# Patient Record
Sex: Male | Born: 1945
Health system: Southern US, Community
[De-identification: ages and names within clinical notes are randomized; demographics above are authoritative.]

## PROBLEM LIST (undated history)

## (undated) DIAGNOSIS — F419 Anxiety disorder, unspecified: Secondary | ICD-10-CM

## (undated) DIAGNOSIS — M549 Dorsalgia, unspecified: Secondary | ICD-10-CM

## (undated) DIAGNOSIS — G8929 Other chronic pain: Secondary | ICD-10-CM

## (undated) DIAGNOSIS — Z720 Tobacco use: Secondary | ICD-10-CM

## (undated) DIAGNOSIS — J449 Chronic obstructive pulmonary disease, unspecified: Secondary | ICD-10-CM

## (undated) DIAGNOSIS — E785 Hyperlipidemia, unspecified: Secondary | ICD-10-CM

## (undated) DIAGNOSIS — K4090 Unilateral inguinal hernia, without obstruction or gangrene, not specified as recurrent: Secondary | ICD-10-CM

## (undated) DIAGNOSIS — Z8659 Personal history of other mental and behavioral disorders: Secondary | ICD-10-CM

## (undated) HISTORY — DX: Chronic obstructive pulmonary disease, unspecified: J44.9

## (undated) HISTORY — DX: Personal history of other mental and behavioral disorders: Z86.59

## (undated) HISTORY — DX: Other chronic pain: G89.29

## (undated) HISTORY — PX: LAPAROTOMY: SHX154

## (undated) HISTORY — DX: Tobacco use: Z72.0

## (undated) HISTORY — DX: Hyperlipidemia, unspecified: E78.5

## (undated) HISTORY — DX: Unilateral inguinal hernia, without obstruction or gangrene, not specified as recurrent: K40.90

## (undated) HISTORY — DX: Dorsalgia, unspecified: M54.9

## (undated) HISTORY — DX: Anxiety disorder, unspecified: F41.9

---

## 1998-07-03 HISTORY — PX: EXPLORATORY LAPAROTOMY: SUR591

## 2005-07-03 HISTORY — PX: LUMBAR FUSION: SHX111

## 2006-07-04 ENCOUNTER — Encounter: Admission: RE | Admit: 2006-07-04 | Discharge: 2006-07-04 | Payer: Self-pay | Admitting: Orthopaedic Surgery

## 2006-07-17 ENCOUNTER — Inpatient Hospital Stay (HOSPITAL_COMMUNITY): Admission: RE | Admit: 2006-07-17 | Discharge: 2006-07-20 | Payer: Self-pay | Admitting: Orthopaedic Surgery

## 2006-07-17 HISTORY — PX: LUMBAR FUSION: SHX111

## 2006-09-23 ENCOUNTER — Emergency Department (HOSPITAL_COMMUNITY): Admission: EM | Admit: 2006-09-23 | Discharge: 2006-09-24 | Payer: Self-pay | Admitting: Emergency Medicine

## 2006-12-06 ENCOUNTER — Encounter: Admission: RE | Admit: 2006-12-06 | Discharge: 2006-12-06 | Payer: Self-pay | Admitting: Orthopaedic Surgery

## 2010-11-18 NOTE — Discharge Summary (Signed)
NAMEJHACE, FENNELL NO.:  [id number]   MEDICAL RECORD NO.:  [id number]          PATIENT TYPE:  INP   LOCATION:  5006                         FACILITY:  [facility]   PHYSICIAN:  Sharolyn Douglas, M.D.        DATE OF BIRTH:  04-Nov-1945   DATE OF ADMISSION:  07/17/2006  DATE OF DISCHARGE:  07/20/2006                               DISCHARGE SUMMARY   ADMITTING DIAGNOSES:  1. L4-5 degenerative disease, spinal stenosis.  2. Hypercholesterolemia.   DISCHARGE DIAGNOSES:  1. Status post L4-5 decompression and fusion doing well.  2. Postoperative blood loss anemia.  3. Postoperative hyperglycemia.  4. Hypocholesterolemia.   PROCEDURE:  On July 18, 2006, patient was admitted to the hospital  and taken to the operating room for an L4-5 posterior spinal fusion with  pedicle screws and TLIF.  Surgeon was Sharolyn Douglas, assistant [employer], PA-C.  Anesthesia was general.   CONSULTS:  None.   LABS:  Preoperatively, CBC with diff was within normal limits.  Postoperatively, H&H x3 days reached a low of 11.9 and 34.0 on July 20, 2006.  Coags preop normal.  Complete metabolic panel preoperatively  was normal with the exception of TP of 5.7.  Basic metabolic panel x2  days postoperatively had an elevated glucose of 101 and 127 on postop  day one and two, respectively, otherwise normal.  UA preop was negative.  Blood typing was O-negative.  Antibody screen negative.  Urine cultures  from January 11 showed staph species, 15,000 colonies.   X-rays of the lumbar spine from January 15 intraoperatively for  localization.   X-rays of the lumbar spine on July 20, 2006 showed satisfactory  appearance in L4-5 fusion.   BRIEF HISTORY:  Patient is a 65 year old male who has had increasing  problems with his back extending into his left lower extremity.  He has  had previous laminectomy at L4-5.  His imaging studies shows scoliosis  with collapse at L4-5 disc space on the left side  secondary to  lumbosacral fracture and curve.  His pain had been increasing and failed  to improve with conservative treatments.  Because of this as well as his  x-ray and MRI findings, it was thought his best course of management  would be revision, decompression and fusion at L4-5.  Risks and benefits  of the surgery were discussed with the patient by Dr. Noel Gerold as well as  myself, he indicated understanding and opted to proceed.   HOSPITAL COURSE:  Patient is a 60-year-ol male who was admitted to the  hospital on July 17, 2006 and taken to the operating room for the  above-listed procedure.  He tolerated the procedure well without any  intraoperatively complications and he was transferred to the recovery  room in stable condition.   Postoperatively, routine orthopedic spine protocol was followed and he  progressed along very well with that.  He did not develop any  significant medical complications postoperatively.   Physical therapy and occupational therapy worked with him on a daily  basis on a progressive ambulation program.  They also  worked on brace  use and back precautions.  He was independent and safe with all of the  above prior to his discharge day.   By July 20, 2006, patient had met all orthopedic goals, he was  medically stable and ready for discharge home.   DISCHARGE PLAN:  Patient is a 65 year old male status post posterior  spinal fusion, revision and decompression at L4-5 doing well.  Activity  is daily ambulation program, brace on when he is up, no lifting greater  than five pounds, back precautions at all times, patient may shower,  dressing as needed.  Follow up two weeks postoperatively with Dr. Noel Gerold.   DIET:  Regular diet as tolerated.   MEDICATIONS:  1. Percocet for pain.  2. Robaxin for muscle spasm.  3. Multivitamin daily.  4. Calcium 1200 daily.  5. Colace 100 mg twice daily as needed.  6. Lactulose as needed.  7. Avoid NSAIDsx3 months.    FOLLOWUP:  Follow up two weeks postoperatively with Dr. Noel Gerold.   CONDITION ON DISCHARGE:  Stable and improved.   DISPOSITION:  Patient will be discharged to his home with his family's  assistance as well as home health physical therapy and occupational  therapy.      Verlin Fester, P.A.      Sharolyn Douglas, M.D.  Electronically Signed    CM/MEDQ  D:  10/25/2006  T:  10/25/2006  Job:  [id number]

## 2010-11-18 NOTE — H&P (Signed)
Mark Giles, FULLEN NO.:  [id number]   MEDICAL RECORD NO.:  [id number]           PATIENT TYPE:   LOCATION:                                 FACILITY:   PHYSICIAN:  Sharolyn Douglas, M.D.        DATE OF BIRTH:  12-05-1945   DATE OF ADMISSION:  07/18/2006  DATE OF DISCHARGE:                              HISTORY & PHYSICAL   CHIEF COMPLAINT:  Low back pain.   HISTORY:  The patient is a 65 year old male who has had a long history  of problems with his back.  Unfortunately, he has failed to improve with  any conservative treatments that have been tried.  He has been found to  have L4-5 degenerative disk disease and spinal stenosis.  He has had a  previous L4-5 microdiskectomy without any lasting relief of his  symptoms.  At this point, it is thought secondary to his MRI and x-ray  findings, as well as his failure to improve conservatively, his best  course of management would be a revision decompression and the posterior  spinal fusion at L4-5.  Risks and benefits of the surgery were discussed  at length with the patient by Dr. Noel Gerold, as well as myself.  He  indicated understanding and opted to proceed.   ALLERGIES:  None.   MEDICATIONS:  Xanax, Avinza, Darvocet, Neurontin, Prozac and Mevacor.   PAST MEDICAL HISTORY:  Hypercholesterolemia.   PAST SURGICAL HISTORY:  L4-5 microdiskectomy.   SOCIAL HISTORY:  The patient smokes 1 pack of cigarettes per day.  Drinks sporadically.  He states that he does not have difficulty going  without alcohol for an extended period of time.  He is married.  His  wife will be available to help him postoperatively.  He is a Surveyor, [employer].   FAMILY MEDICAL HISTORY:  Noncontributory.   REVIEW OF SYSTEMS:  The patient denies fevers, chills, sweats or  bleeding tendency.  CNS:  Denies blurred vision, double vision,  seizures, headache, paralysis.  CARDIOVASCULAR:  Denies chest pain,  angina, orthopnea, claudication or palpitations.   PULMONARY:  Denies  shortness of breath, productive cough or hemoptysis.  GI:  Denies  nausea, vomiting, constipation, diarrhea, melena or bloody stool.  GU:  Denies dysuria, hematuria or discharge.  MUSCULOSKELETAL:  As per HPI.   PHYSICAL EXAM:  VITAL SIGNS:  Blood pressure is 118/72.  Respirations  18, unlabored.  Pulse is 76 and regular.  GENERAL APPEARANCE:  The patient is a 65 year old white male who is  alert and oriented, in no acute distress.  He is well nourished, well  groomed.  Appears stated age.  Pleasant, cooperative with exam.  HEENT:  Head is normocephalic, atraumatic.  Pupils equal, round and  reactive to light.  Extraocular movements intact.  Nares are patent.  Pharynx is clear.  NECK:  Soft to palpation.  No lymphadenopathy, thyromegaly or bruits  appreciated.  CHEST:  Clear to auscultation bilaterally.  No rales, rhonchi, stridor,  wheezes or friction rubs.  BREASTS:  Not pertinent, not performed.  HEART:  S1, S2, regular rate and  rhythm.  No murmurs, gallops or rubs  noted.  ABDOMEN:  Soft to palpation.  Nontender, nondistended.  No organomegaly  noted.  GU:  Not pertinent, not performed.  EXTREMITIES:  As per HPI.  SKIN:  Intact without any lesions or rashes.   IMPRESSION:  1. L4-5 degenerative disk disease and spinal stenosis.  2. Hypercholesterolemia.   PLAN:  Admit to [facility] on July 18, 2006, for an L4-5  revision decompression and posterior spinal fusion to be done by Dr.  Noel Gerold.      Verlin Fester, P.A.      Sharolyn Douglas, M.D.  Electronically Signed    CM/MEDQ  D:  07/09/2006  T:  07/10/2006  Job:  [id number]

## 2010-11-18 NOTE — Op Note (Signed)
NAMELORI, LIEW NO.:  [id number]   MEDICAL RECORD NO.:  [id number]          PATIENT TYPE:  INP   LOCATION:  2899                         FACILITY:  [facility]   PHYSICIAN:  Sharolyn Douglas, M.D.        DATE OF BIRTH:  06-28-1946   DATE OF PROCEDURE:  07/17/2006  DATE OF DISCHARGE:                               OPERATIVE REPORT   PREOPERATIVE DIAGNOSES:  1. Recurrent lumbar spinal stenosis.  2. Lumbar scoliosis.  3. Chronic back and left lower extremity pain.   POSTOPERATIVE DIAGNOSES:  1. Recurrent lumbar spinal stenosis.  2. Lumbar scoliosis.  3. Chronic back and left lower extremity pain.   PROCEDURES:  1. Revision, left L4-5 laminectomy, with decompression of the left L4      and L5 nerve roots.  2. Transforaminal lumbar interbody fusion, L4-5, with placement of 11-      mm PEEK cage.  3. Pedicle-screw instrumentation, L4-5, using the [employer] Spine System.  4. Posterior spinal arthrodesis, L4-5.  5. Local autogenous bone graft supplemented with bone morphogenic      protein.   SURGEON:  Sharolyn Douglas, M.D.   ASSISTANT:  Verlin Fester, P.A.   ANESTHESIA:  General endotracheal.   ESTIMATED BLOOD LOSS:  150 mL.   COMPLICATIONS:  None.   COUNTS:  Needle and sponge count correct.   INDICATIONS:  The patient is a pleasant 65 year old male with  progressively worsening back and left lower extremity pain.  He has had  a previous laminectomy at L4-5.  His imaging studies show scoliosis with  collapse of the L4-5 disk space on the left side due to the lumbosacral  fractional curve.  There is a disk-osteophyte complex underneath the  previous laminotomy, with lateral recess and foraminal narrowing.  He  now presents for lumbar decompression and fusion at this level in hopes  of improving his symptoms.  The risks, benefits and alternatives were  reviewed.   DESCRIPTION OF PROCEDURES:  After informed consent, he was taken to the  operating room.  He underwent  general endotracheal anesthesia without  difficulty and was given prophylactic IV antibiotics.  Neural monitoring  was established in the form of SSEPs and lower extremity EMGs.  He was  carefully turned prone onto the Wilson frame with all bony prominences  padded, and his face and eyes protected, the arms at the sides.  The  back was prepped and draped in the usual sterile fashion.  The previous  midline incision was utilized and extended several centimeters in each  direction.  Dissection was carried sharply through the previous scar and  deep tissue.  A subperiosteal exposure was carried out to the tips of  the transverse processes of L4 and L5 bilaterally.  Intraoperative x-ray  was taken to confirm the levels.  The laminotomy was easily identifiable  on the left side and the scar tissue was debrided.  We then placed  pedicle screws at L4 and L5 using anatomic probing technique.  Each  pedicle starting point was initiated with the awl.  The pedicles were  then cannulated using the  probe.  The pedicles were palpated with the  ball-tipped feeler.  There were no breaches.  The pedicles were tapped  and 6.5 x 50-mm screws were placed at L4 bilaterally, and 6.5 x 45-mm  screws were placed at L5 bilaterally.  The bone quality was somewhat  soft, and the screw purchase was average.  We then stimulated each screw  with triggered EMGs and there were no deleterious changes.  Before  placing the screws, we had decorticated the transverse processes at L4  and L5 bilaterally in preparation for the arthrodesis.  We then  initiated a television lumbar decompression by removing the inferior 2/3  of the L4 spinous process.  We then removed the inferior 2/3 of the  remaining left L4 lamina.  The pars interarticularis was transected.  The scar tissue around the lateral border of the thecal sac and the L5  nerve root was carefully dissected.  We found that the L5 nerve root was  adherent to the  underlying disk space.  The nerve root was gently  mobilized and we were able to decompress the disk osteophyte back into  the interspace, and this was removed with pituitaries.  The  decompression was carried proximally, identifying the L4 nerve root as  it exited through the foramen.  At this point, it was elected to proceed  with a transforaminal lumbar interbody fusion in the office further  decompress the foramen indirectly with distraction.  This would also  improve the fusion rate.  The L4 and L5 nerve roots were carefully  identified.  Free-running EMGs were monitored.  The disk space was  entered and dilated up to 11 mm.  A radical diskectomy was completed and  the cartilaginous endplates were scraped clean.  The disk space was  packed with BMP sponges, along with local bone graft obtained from the  laminectomy.  We then inserted an 11-mm PEEK cage into the interspace,  tamped it anteriorly and across the midline.  The cage had been packed  with a BMP sponge.  There were no deleterious changes in the free-  running EMGs throughout the TLIF procedure.  We completed the posterior  spinal fusion by packing the remaining local bone graft over the lateral  gutters between L4 and L5.  And 48-mm titanium rods were placed into the  polyaxial screwheads and compression was applied before shearing off the  locking caps.  The L4 and L5 nerve roots were evaluated using a blunt  probe and they were found to be completely decompressed.  There was  significant scarring noted about the L5 nerve root, which had been  neurolysed.  Hemostasis was achieved.  Gelfoam was left over the excised  epidural space.  A deep drain was left.  The deep fascia was closed with  a running #1 Vicryl suture.  The subcutaneous layer was closed with 2-0  Vicryl, followed by 3-0 subcuticular Vicryl suture on the skin edges,  and then Dermabond on the skin.  A sterile dressing was applied.  The patient was turned  supine, extubated without difficulty, and transferred  to recovery in stable condition, able to move his upper and lower  extremities.   It should be noted that my assistance, Verlin Fester, P.A., was present  throughout the procedure, including the positioning, the exposure, the  decompression, the instrumentation, the fusion, and she assisted with  wound closure.      Sharolyn Douglas, M.D.  Electronically Signed     MC/MEDQ  D:  07/18/2006  T:  07/18/2006  Job:  [id number]

## 2011-07-04 DIAGNOSIS — K4090 Unilateral inguinal hernia, without obstruction or gangrene, not specified as recurrent: Secondary | ICD-10-CM

## 2011-07-04 HISTORY — DX: Unilateral inguinal hernia, without obstruction or gangrene, not specified as recurrent: K40.90

## 2012-01-12 ENCOUNTER — Other Ambulatory Visit (HOSPITAL_COMMUNITY): Payer: Self-pay | Admitting: Internal Medicine

## 2012-01-12 DIAGNOSIS — J449 Chronic obstructive pulmonary disease, unspecified: Secondary | ICD-10-CM

## 2012-03-08 ENCOUNTER — Ambulatory Visit (INDEPENDENT_AMBULATORY_CARE_PROVIDER_SITE_OTHER): Payer: Self-pay | Admitting: General Surgery

## 2012-03-15 ENCOUNTER — Ambulatory Visit (INDEPENDENT_AMBULATORY_CARE_PROVIDER_SITE_OTHER): Payer: BC Managed Care – PPO | Admitting: General Surgery

## 2012-04-05 ENCOUNTER — Ambulatory Visit (INDEPENDENT_AMBULATORY_CARE_PROVIDER_SITE_OTHER): Payer: BC Managed Care – PPO | Admitting: General Surgery

## 2012-04-29 DIAGNOSIS — M545 Low back pain, unspecified: Secondary | ICD-10-CM | POA: Diagnosis not present

## 2012-04-29 DIAGNOSIS — J449 Chronic obstructive pulmonary disease, unspecified: Secondary | ICD-10-CM | POA: Diagnosis not present

## 2012-05-06 ENCOUNTER — Ambulatory Visit (HOSPITAL_COMMUNITY)
Admission: RE | Admit: 2012-05-06 | Discharge: 2012-05-06 | Disposition: A | Discharge status: Home / Self Care | Payer: Medicare Other | Source: Ambulatory Visit | Attending: Internal Medicine | Admitting: Internal Medicine

## 2012-05-06 DIAGNOSIS — J449 Chronic obstructive pulmonary disease, unspecified: Secondary | ICD-10-CM | POA: Insufficient documentation

## 2012-05-06 DIAGNOSIS — J4489 Other specified chronic obstructive pulmonary disease: Secondary | ICD-10-CM | POA: Insufficient documentation

## 2012-05-10 ENCOUNTER — Ambulatory Visit (INDEPENDENT_AMBULATORY_CARE_PROVIDER_SITE_OTHER): Payer: Medicare Other | Admitting: General Surgery

## 2012-05-10 ENCOUNTER — Encounter (INDEPENDENT_AMBULATORY_CARE_PROVIDER_SITE_OTHER): Payer: Self-pay | Admitting: General Surgery

## 2012-05-10 VITALS — BP 118/82 | HR 68 | Temp 98.6°F | Resp 16 | Ht 70.0 in | Wt 160.6 lb

## 2012-05-10 DIAGNOSIS — K4020 Bilateral inguinal hernia, without obstruction or gangrene, not specified as recurrent: Secondary | ICD-10-CM

## 2012-05-10 NOTE — Progress Notes (Signed)
Subjective:   bilateral groin hernias  Patient ID: Mark Giles, male   DOB: 1945/09/03, 66 y.o.   MRN: [id number]  HPI Patient is a 66 year old male referred by Dr. Eula Listen for apparent inguinal hernias. He states that for about 3 years he has noted a progressive bulge initially in the right groin and now in the left groin. He gets some occasional gurgling in the areas and has some discomfort but no severe pain. No nausea or vomiting. Bowel movements are okay. He has not had a colonoscopy. No urinary symptoms. No previous hernia repairs.  Past Medical History  Diagnosis Date  . Anxiety   . COPD (chronic obstructive pulmonary disease)   . Hyperlipidemia   . H/O: depression   . Dyslipidemia   . Tobacco abuse   . Inguinal hernia without mention of obstruction or gangrene, unilateral or unspecified, (not specified as recurrent) 2013  . Chronic back pain     History of    Past Surgical History  Procedure Date  . Lumbar fusion 2007    L4-L5  . Laparotomy     Trauma   Current Outpatient Prescriptions  Medication Sig Dispense Refill  . ALPRAZolam (XANAX) 1 MG tablet       . lovastatin (MEVACOR) 20 MG tablet       . morphine (MS CONTIN) 30 MG 12 hr tablet        Allergies no known allergies  History  Substance Use Topics  . Smoking status: Current Every Day Smoker -- 2.0 packs/day    Types: Cigarettes  . Smokeless tobacco: Not on file  . Alcohol Use: Yes     Review of Systems     Objective:   Physical Exam BP 118/82  Pulse 68  Temp 98.6 F (37 C) (Temporal)  Resp 16  Ht 5\' 10"  (1.778 m)  Wt 160 lb 9.6 oz (72.848 kg)  BMI 23.04 kg/m2 General: Alert, well-developed Caucasian male, in no distress Skin: Warm and dry without rash or infection. HEENT: No palpable masses or thyromegaly. Sclera nonicteric. Pupils equal round and reactive. Oropharynx clear. Lymph nodes: No cervical, supraclavicular, or inguinal nodes palpable. Lungs: Breath sounds clear and equal without  increased work of breathing Cardiovascular: Regular rate and rhythm without murmur. No JVD or edema. Peripheral pulses intact. Abdomen: Nondistended. Soft and nontender. No masses palpable. No organomegaly. There is a well-healed upper midline incision that does not get below the umbilicus. There are easily palpable bilateral inguinal hernias, moderate sized on the right and somewhat smaller on the left that do not extend down into the scrotum. Extremities: No edema or joint swelling or deformity. No chronic venous stasis changes. Neurologic: Alert and fully oriented. Gait normal.     Assessment:     Symptomatic bilateral inguinal hernias. I believe these should be repaired. We discussed options for repair including open and laparoscopic repair. I believe he would be an ideal candidate for laparoscopic repair due to the bilaterality and this is what he would prefer. We discussed the nature of the surgery and expected recovery period with discussed risks of anesthetic complications, bleeding, infection, recurrence, and chronic pain and possible need for open procedure. Was given literature and all his questions were answered.    Plan:     Laparoscopic repair of bilateral inguinal hernias under general anesthesia as an outpatient.  We discussed that if he can cut down or reduce her cigarette smoking this would benefit his healing from surgery and anesthetic risk as well as  other health benefits. He is working with Dr. Rene Paci on this.

## 2012-05-10 NOTE — Patient Instructions (Signed)
Hernia, Surgical Repair °A hernia occurs when an internal organ pushes out through a weak spot in the belly (abdominal) wall muscles. Hernias commonly occur in the groin and around the navel. Hernias often can be pushed back into place (reduced). Most hernias tend to get worse over time. Problems occur when abdominal contents get stuck in the opening (incarcerated hernia). The blood supply gets cut off (strangulated hernia). This is an emergency and needs surgery. Otherwise, hernia repair can be an elective procedure. This means you can schedule this at your convenience when an emergency is not present. Because complications can occur, if you decide to repair the hernia, it is best to do it soon. When it becomes an emergency procedure, there is increased risk of complications after surgery. °CAUSES  °· Heavy lifting. °· Obesity. °· Prolonged coughing. °· Straining to move your bowels. °· Hernias can also occur through a cut (incision) by a surgeonafter an abdominal operation. °HOME CARE INSTRUCTIONS °Before the repair: °· Bed rest is not required. You may continue your normal activities, but avoid heavy lifting (more than 10 pounds) or straining. Cough gently. If you are a smoker, it is best to stop. Even the best hernia repair can break down with the continual strain of coughing. °· Do not wear anything tight over your hernia. Do not try to keep it in with an outside bandage or truss. These can damage abdominal contents if they are trapped in the hernia sac. °· Eat a normal diet. Avoid constipation. Straining over long periods of time to have a bowel movement will increase hernia size. It also can breakdown repairs. If you cannot do this with diet alone, laxatives or stool softeners may be used. °PRIOR TO SURGERY, SEEK IMMEDIATE MEDICAL CARE IF: °You have problems (symptoms) of a trapped (incarcerated) hernia. Symptoms include: °· An oral temperature above 102° F (38.9° C) develops, or as your caregiver  suggests. °· Increasing abdominal pain. °· Feeling sick to your stomach(nausea) and vomiting. °· You stop passing gas or stool. °· The hernia is stuck outside the abdomen, looks discolored, feels hard, or is tender. °· You have any changes in your bowel habits or in the hernia that is unusual for you. °LET YOUR CAREGIVERS KNOW ABOUT THE FOLLOWING: °· Allergies. °· Medications taken including herbs, eye drops, over the counter medications, and creams. °· Use of steroids (by mouth or creams). °· Family or personal history of problems with anesthetics or Novocaine. °· Possibility of pregnancy, if this applies. °· Personal history of blood clots (thrombophlebitis). °· Family or personal history of bleeding or blood problems. °· Previous surgery. °· Other health problems. °BEFORE THE PROCEDURE °You should be present 1 hour prior to your procedure, or as directed by your caregiver.  °AFTER THE PROCEDURE °After surgery, you will be taken to the recovery area. A nurse will watch and check your progress there. Once you are awake, stable, and taking fluids well, you will be allowed to go home as long as there are no problems. Once home, an ice pack (wrapped in a light towel) applied to your operative site may help with discomfort. It may also keep the swelling down. Do not lift anything heavier than 10 pounds (4.55 kilograms). Take showers not baths. Do not drive while taking narcotics. Follow instructions as suggested by your caregiver.  °SEEK IMMEDIATE MEDICAL CARE IF: °After surgery: °· There is redness, swelling, or increasing pain in the wound. °· There is pus coming from the wound. °· There is   drainage from a wound lasting longer than 1 day. °· An unexplained oral temperature above 102° F (38.9° C) develops. °· You notice a foul smell coming from the wound or dressing. °· There is a breaking open of a wound (edged not staying together) after the sutures have been removed. °· You notice increasing pain in the shoulders  (shoulder strap areas). °· You develop dizzy episodes or fainting while standing. °· You develop persistent nausea or vomiting. °· You develop a rash. °· You have difficulty breathing. °· You develop any reaction or side effects to medications given. °MAKE SURE YOU:  °· Understand these instructions. °· Will watch your condition. °· Will get help right away if you are not doing well or get worse. °Document Released: 12/13/2000 Document Revised: 09/11/2011 Document Reviewed: 11/05/2007 °[employer]® Patient Information ©2013 [employer]. ° °

## 2012-07-05 DIAGNOSIS — K4020 Bilateral inguinal hernia, without obstruction or gangrene, not specified as recurrent: Secondary | ICD-10-CM | POA: Diagnosis not present

## 2012-07-06 HISTORY — PX: HERNIA REPAIR: SHX51

## 2012-07-06 HISTORY — PX: LAPAROSCOPIC INGUINAL HERNIA REPAIR: SUR788

## 2012-07-19 DIAGNOSIS — Z125 Encounter for screening for malignant neoplasm of prostate: Secondary | ICD-10-CM | POA: Diagnosis not present

## 2012-07-19 DIAGNOSIS — Z1331 Encounter for screening for depression: Secondary | ICD-10-CM | POA: Diagnosis not present

## 2012-07-19 DIAGNOSIS — F329 Major depressive disorder, single episode, unspecified: Secondary | ICD-10-CM | POA: Diagnosis not present

## 2012-07-19 DIAGNOSIS — M545 Low back pain, unspecified: Secondary | ICD-10-CM | POA: Diagnosis not present

## 2012-07-19 DIAGNOSIS — J449 Chronic obstructive pulmonary disease, unspecified: Secondary | ICD-10-CM | POA: Diagnosis not present

## 2012-07-19 DIAGNOSIS — F17200 Nicotine dependence, unspecified, uncomplicated: Secondary | ICD-10-CM | POA: Diagnosis not present

## 2012-07-19 DIAGNOSIS — E782 Mixed hyperlipidemia: Secondary | ICD-10-CM | POA: Diagnosis not present

## 2012-07-19 DIAGNOSIS — Z0000 Encounter for general adult medical examination without abnormal findings: Secondary | ICD-10-CM | POA: Diagnosis not present

## 2012-07-25 ENCOUNTER — Telehealth (INDEPENDENT_AMBULATORY_CARE_PROVIDER_SITE_OTHER): Payer: Self-pay

## 2012-07-25 NOTE — Telephone Encounter (Signed)
Left message for patient with follow up appointment for 07/26/12 @ 3:15 pm w/Dr. Johna Sheriff

## 2012-07-26 ENCOUNTER — Encounter (INDEPENDENT_AMBULATORY_CARE_PROVIDER_SITE_OTHER): Payer: Medicare Other | Admitting: General Surgery

## 2012-07-26 ENCOUNTER — Telehealth (INDEPENDENT_AMBULATORY_CARE_PROVIDER_SITE_OTHER): Payer: Self-pay

## 2012-07-26 NOTE — Telephone Encounter (Signed)
Pt called stating he does not want to keep appt for today and asked [place] to cx appt. I have cxed appt and advised Christy appt cx.

## 2012-08-07 ENCOUNTER — Encounter (INDEPENDENT_AMBULATORY_CARE_PROVIDER_SITE_OTHER): Payer: Self-pay | Admitting: General Surgery

## 2012-09-09 ENCOUNTER — Other Ambulatory Visit: Payer: Self-pay | Admitting: Gastroenterology

## 2012-10-01 ENCOUNTER — Encounter (HOSPITAL_COMMUNITY): Payer: Self-pay

## 2012-10-03 ENCOUNTER — Encounter (HOSPITAL_COMMUNITY): Payer: Self-pay | Admitting: *Deleted

## 2012-10-22 ENCOUNTER — Encounter (HOSPITAL_COMMUNITY): Payer: Self-pay | Admitting: *Deleted

## 2012-10-22 ENCOUNTER — Encounter (HOSPITAL_COMMUNITY): Payer: Self-pay | Admitting: Anesthesiology

## 2012-10-22 ENCOUNTER — Ambulatory Visit (HOSPITAL_COMMUNITY): Payer: Medicare Other | Admitting: Anesthesiology

## 2012-10-22 ENCOUNTER — Encounter (HOSPITAL_COMMUNITY)
Admission: RE | Disposition: A | Discharge status: Home / Self Care | Payer: Self-pay | Source: Ambulatory Visit | Attending: Gastroenterology

## 2012-10-22 ENCOUNTER — Ambulatory Visit (HOSPITAL_COMMUNITY)
Admission: RE | Admit: 2012-10-22 | Discharge: 2012-10-22 | Disposition: A | Discharge status: Home / Self Care | Payer: Medicare Other | Source: Ambulatory Visit | Attending: Gastroenterology | Admitting: Gastroenterology

## 2012-10-22 DIAGNOSIS — J449 Chronic obstructive pulmonary disease, unspecified: Secondary | ICD-10-CM | POA: Insufficient documentation

## 2012-10-22 DIAGNOSIS — F329 Major depressive disorder, single episode, unspecified: Secondary | ICD-10-CM | POA: Diagnosis not present

## 2012-10-22 DIAGNOSIS — Z1211 Encounter for screening for malignant neoplasm of colon: Secondary | ICD-10-CM | POA: Insufficient documentation

## 2012-10-22 DIAGNOSIS — K5730 Diverticulosis of large intestine without perforation or abscess without bleeding: Secondary | ICD-10-CM | POA: Diagnosis not present

## 2012-10-22 DIAGNOSIS — J4489 Other specified chronic obstructive pulmonary disease: Secondary | ICD-10-CM | POA: Insufficient documentation

## 2012-10-22 DIAGNOSIS — F17200 Nicotine dependence, unspecified, uncomplicated: Secondary | ICD-10-CM | POA: Insufficient documentation

## 2012-10-22 DIAGNOSIS — E785 Hyperlipidemia, unspecified: Secondary | ICD-10-CM | POA: Insufficient documentation

## 2012-10-22 DIAGNOSIS — G8929 Other chronic pain: Secondary | ICD-10-CM | POA: Diagnosis not present

## 2012-10-22 DIAGNOSIS — M549 Dorsalgia, unspecified: Secondary | ICD-10-CM | POA: Insufficient documentation

## 2012-10-22 DIAGNOSIS — F3289 Other specified depressive episodes: Secondary | ICD-10-CM | POA: Insufficient documentation

## 2012-10-22 DIAGNOSIS — F411 Generalized anxiety disorder: Secondary | ICD-10-CM | POA: Diagnosis not present

## 2012-10-22 HISTORY — PX: COLONOSCOPY WITH PROPOFOL: SHX5780

## 2012-10-22 SURGERY — COLONOSCOPY WITH PROPOFOL
Anesthesia: Monitor Anesthesia Care

## 2012-10-22 MED ORDER — LACTATED RINGERS IV SOLN
INTRAVENOUS | Status: DC
  Administered 2012-10-22: 1000 mL via INTRAVENOUS

## 2012-10-22 MED ORDER — ONDANSETRON HCL 4 MG/2ML IJ SOLN
INTRAMUSCULAR | Status: DC | PRN
  Administered 2012-10-22 (×2): 2 mg via INTRAVENOUS

## 2012-10-22 MED ORDER — SODIUM CHLORIDE 0.9 % IV SOLN: INTRAVENOUS | Status: DC

## 2012-10-22 MED ORDER — PROPOFOL 10 MG/ML IV EMUL
INTRAVENOUS | Status: DC | PRN
  Administered 2012-10-22: 100 ug/kg/min via INTRAVENOUS

## 2012-10-22 SURGICAL SUPPLY — 21 items

## 2012-10-22 NOTE — H&P (Signed)
Procedure: Screening colonoscopy  History: The patient is a 67 year old male born 1945/12/29. The patient is scheduled to undergo his first screening colonoscopy with polypectomy to prevent colon cancer.  Chronic medications: Morphine. Lovastatin. Alprazolam.  Past medical and surgical history: Depression. Dyslipidemia. Chronic back pain. Chronic obstructive pulmonary disease. Lumbar disc surgery.  Habits: The patient smokes one pack of cigarettes per day and has chronic obstructive pulmonary disease. He does not consume alcohol.  Medication allergies: None.  Exam: The patient is alert and lying comfortably on the endoscopy stretcher. Lungs are clear the auscultation. Cardiac exam reveals a regular rhythm without audible murmurs. Abdomen is soft, flat, and nontender to palpation.  Plan: Proceed with baseline screening colonoscopy using propofol sedation.

## 2012-10-22 NOTE — Anesthesia Preprocedure Evaluation (Addendum)
Anesthesia Evaluation  Patient identified by MRN, date of birth, ID band Patient awake    Reviewed: Allergy & Precautions, H&P , NPO status , Patient's Chart, lab work & pertinent test results  Airway Mallampati: II TM Distance: >3 FB Neck ROM: full    Dental  (+) Missing and Dental Advisory Given Missing several front teeth lateral:   Pulmonary COPDCurrent Smoker,  Moderate COPD breath sounds clear to auscultation  Pulmonary exam normal       Cardiovascular Exercise Tolerance: Good negative cardio ROS  Rhythm:regular Rate:Normal     Neuro/Psych Anxiety negative neurological ROS  negative psych ROS   GI/Hepatic negative GI ROS, Neg liver ROS,   Endo/Other  negative endocrine ROS  Renal/GU negative Renal ROS  negative genitourinary   Musculoskeletal   Abdominal   Peds  Hematology negative hematology ROS (+)   Anesthesia Other Findings   Reproductive/Obstetrics negative OB ROS                          Anesthesia Physical Anesthesia Plan  ASA: III  Anesthesia Plan: MAC   Post-op Pain Management:    Induction:   Airway Management Planned: Simple Face Mask  Additional Equipment:   Intra-op Plan:   Post-operative Plan:   Informed Consent: I have reviewed the patients History and Physical, chart, labs and discussed the procedure including the risks, benefits and alternatives for the proposed anesthesia with the patient or authorized representative who has indicated his/her understanding and acceptance.   Dental Advisory Given  Plan Discussed with: CRNA and Surgeon  Anesthesia Plan Comments:        Anesthesia Quick Evaluation

## 2012-10-22 NOTE — Discharge Instructions (Signed)
Colonoscopy  Care After  These instructions give you information on caring for yourself after your procedure. Your doctor may also give you more specific instructions. Call your doctor if you have any problems or questions after your procedure.  HOME CARE  · Take it easy for the next 24 hours.  · Rest.  · Walk or use warm packs on your belly (abdomen) if you have belly cramping or gas.  · Do not drive for 24 hours.  · You may shower.  · Do not sign important papers or use machinery for 24 hours.  · Drink enough fluids to keep your pee (urine) clear or pale yellow.  · Resume your normal diet. Avoid heavy or fried foods.  · Avoid alcohol.  · Continue taking your normal medicines.  · Only take medicine as told by your doctor. Do not take aspirin.  If you had growths (polyps) removed:  · Do not take aspirin.  · Do not drink alcohol for 7 days or as told by your doctor.  · Eat a soft diet for 24 hours.  GET HELP RIGHT AWAY IF:  · You have a fever.  · You pass clumps of tissue (blood clots) or fill the toilet with blood.  · You have belly pain that gets worse and medicine does not help.  · Your belly is puffy (swollen).  · You feel sick to your stomach (nauseous) or throw up (vomit).  MAKE SURE YOU:  · Understand these instructions.  · Will watch your condition.  · Will get help right away if you are not doing well or get worse.  Document Released: 07/22/2010 Document Revised: 09/11/2011 Document Reviewed: 07/22/2010  ExitCare® Patient Information ©2013 [employer].

## 2012-10-22 NOTE — Anesthesia Postprocedure Evaluation (Signed)
  Anesthesia Post-op Note  Patient: Mark Giles  Procedure(s) Performed: Procedure(s) (LRB): COLONOSCOPY WITH PROPOFOL (N/A)  Patient Location: PACU  Anesthesia Type: MAC  Level of Consciousness: awake and alert   Airway and Oxygen Therapy: Patient Spontanous Breathing  Post-op Pain: mild  Post-op Assessment: Post-op Vital signs reviewed, Patient's Cardiovascular Status Stable, Respiratory Function Stable, Patent Airway and No signs of Nausea or vomiting  Last Vitals:  Filed Vitals:   10/22/12 1156  BP: 98/60  Temp:   Resp: 18    Post-op Vital Signs: stable   Complications: No apparent anesthesia complications

## 2012-10-22 NOTE — Op Note (Signed)
Procedure: Screening colonoscopy  Endoscopist: Danise Edge  Premedication: Propofol administered by anesthesia  Procedure: The patient was placed in the left lateral decubitus position. Anal inspection and digital rectal exam were normal. The Pentax pediatric colonoscope was introduced into the rectum and advanced to the cecum. A normal-appearing ileocecal valve and appendiceal orifice were identified. Colonic preparation for the exam today was good.  Rectum. Normal. Retroflex view of the distal rectum normal.  Sigmoid colon and descending colon. Left colonic diverticulosis.  Splenic flexure. Normal.  Transverse colon. Normal.  Hepatic flexure. Normal.  Ascending colon. Normal.  Cecum and ileocecal valve. Normal.  Assessment: Normal screening proctocolonoscopy to the cecum.  Recommendations: Schedule repeat screening colonoscopy in 10 years.

## 2012-10-22 NOTE — Transfer of Care (Signed)
Immediate Anesthesia Transfer of Care Note  Patient: Mark Giles  Procedure(s) Performed: Procedure(s): COLONOSCOPY WITH PROPOFOL (N/A)  Patient Location: PACU  Anesthesia Type:MAC  Level of Consciousness: awake, alert , oriented and patient cooperative  Airway & Oxygen Therapy: Patient Spontanous Breathing and Patient connected to face mask oxygen  Post-op Assessment: Report given to PACU RN and Post -op Vital signs reviewed and stable  Post vital signs: Reviewed and stable  Complications: No apparent anesthesia complications

## 2012-10-23 ENCOUNTER — Encounter (HOSPITAL_COMMUNITY): Payer: Self-pay | Admitting: Gastroenterology

## 2013-01-17 DIAGNOSIS — R5381 Other malaise: Secondary | ICD-10-CM | POA: Diagnosis not present

## 2013-01-17 DIAGNOSIS — R5383 Other fatigue: Secondary | ICD-10-CM | POA: Diagnosis not present

## 2013-01-17 DIAGNOSIS — F17200 Nicotine dependence, unspecified, uncomplicated: Secondary | ICD-10-CM | POA: Diagnosis not present

## 2013-01-17 DIAGNOSIS — J449 Chronic obstructive pulmonary disease, unspecified: Secondary | ICD-10-CM | POA: Diagnosis not present

## 2013-01-17 DIAGNOSIS — E782 Mixed hyperlipidemia: Secondary | ICD-10-CM | POA: Diagnosis not present

## 2013-01-27 DIAGNOSIS — R5381 Other malaise: Secondary | ICD-10-CM | POA: Diagnosis not present

## 2013-01-27 DIAGNOSIS — R5383 Other fatigue: Secondary | ICD-10-CM | POA: Diagnosis not present

## 2013-01-27 DIAGNOSIS — F329 Major depressive disorder, single episode, unspecified: Secondary | ICD-10-CM | POA: Diagnosis not present

## 2013-01-27 DIAGNOSIS — R002 Palpitations: Secondary | ICD-10-CM | POA: Diagnosis not present

## 2013-01-27 DIAGNOSIS — R0789 Other chest pain: Secondary | ICD-10-CM | POA: Diagnosis not present

## 2013-02-26 DIAGNOSIS — R0789 Other chest pain: Secondary | ICD-10-CM | POA: Diagnosis not present

## 2013-02-26 DIAGNOSIS — E782 Mixed hyperlipidemia: Secondary | ICD-10-CM | POA: Diagnosis not present

## 2013-02-26 DIAGNOSIS — J449 Chronic obstructive pulmonary disease, unspecified: Secondary | ICD-10-CM | POA: Diagnosis not present

## 2013-02-26 DIAGNOSIS — R002 Palpitations: Secondary | ICD-10-CM | POA: Diagnosis not present

## 2013-02-26 DIAGNOSIS — F17200 Nicotine dependence, unspecified, uncomplicated: Secondary | ICD-10-CM | POA: Diagnosis not present

## 2013-07-22 DIAGNOSIS — F17200 Nicotine dependence, unspecified, uncomplicated: Secondary | ICD-10-CM | POA: Diagnosis not present

## 2013-07-22 DIAGNOSIS — R7309 Other abnormal glucose: Secondary | ICD-10-CM | POA: Diagnosis not present

## 2013-07-22 DIAGNOSIS — F329 Major depressive disorder, single episode, unspecified: Secondary | ICD-10-CM | POA: Diagnosis not present

## 2013-07-22 DIAGNOSIS — Z0000 Encounter for general adult medical examination without abnormal findings: Secondary | ICD-10-CM | POA: Diagnosis not present

## 2013-07-22 DIAGNOSIS — F411 Generalized anxiety disorder: Secondary | ICD-10-CM | POA: Diagnosis not present

## 2013-07-22 DIAGNOSIS — J449 Chronic obstructive pulmonary disease, unspecified: Secondary | ICD-10-CM | POA: Diagnosis not present

## 2013-07-22 DIAGNOSIS — E782 Mixed hyperlipidemia: Secondary | ICD-10-CM | POA: Diagnosis not present

## 2013-07-22 DIAGNOSIS — Z125 Encounter for screening for malignant neoplasm of prostate: Secondary | ICD-10-CM | POA: Diagnosis not present

## 2013-07-22 DIAGNOSIS — Z23 Encounter for immunization: Secondary | ICD-10-CM | POA: Diagnosis not present

## 2014-01-15 ENCOUNTER — Other Ambulatory Visit: Payer: Self-pay | Admitting: Internal Medicine

## 2014-01-15 ENCOUNTER — Ambulatory Visit
Admission: RE | Admit: 2014-01-15 | Discharge: 2014-01-15 | Disposition: A | Discharge status: Home / Self Care | Payer: Medicare Other | Source: Ambulatory Visit | Attending: Internal Medicine | Admitting: Internal Medicine

## 2014-01-15 DIAGNOSIS — J449 Chronic obstructive pulmonary disease, unspecified: Secondary | ICD-10-CM | POA: Diagnosis not present

## 2014-01-15 DIAGNOSIS — F329 Major depressive disorder, single episode, unspecified: Secondary | ICD-10-CM | POA: Diagnosis not present

## 2014-01-15 DIAGNOSIS — R070 Pain in throat: Secondary | ICD-10-CM | POA: Diagnosis not present

## 2014-01-15 DIAGNOSIS — E782 Mixed hyperlipidemia: Secondary | ICD-10-CM | POA: Diagnosis not present

## 2014-01-15 DIAGNOSIS — F17200 Nicotine dependence, unspecified, uncomplicated: Secondary | ICD-10-CM | POA: Diagnosis not present

## 2014-03-17 DIAGNOSIS — Z23 Encounter for immunization: Secondary | ICD-10-CM | POA: Diagnosis not present

## 2014-03-17 DIAGNOSIS — J449 Chronic obstructive pulmonary disease, unspecified: Secondary | ICD-10-CM | POA: Diagnosis not present

## 2014-03-17 DIAGNOSIS — F329 Major depressive disorder, single episode, unspecified: Secondary | ICD-10-CM | POA: Diagnosis not present

## 2014-03-17 DIAGNOSIS — Z1331 Encounter for screening for depression: Secondary | ICD-10-CM | POA: Diagnosis not present

## 2014-03-17 DIAGNOSIS — F411 Generalized anxiety disorder: Secondary | ICD-10-CM | POA: Diagnosis not present

## 2014-03-17 DIAGNOSIS — E782 Mixed hyperlipidemia: Secondary | ICD-10-CM | POA: Diagnosis not present

## 2014-03-31 DIAGNOSIS — J449 Chronic obstructive pulmonary disease, unspecified: Secondary | ICD-10-CM | POA: Diagnosis not present

## 2014-07-15 DIAGNOSIS — H6123 Impacted cerumen, bilateral: Secondary | ICD-10-CM | POA: Diagnosis not present

## 2014-07-15 DIAGNOSIS — H93291 Other abnormal auditory perceptions, right ear: Secondary | ICD-10-CM | POA: Diagnosis not present

## 2014-07-15 DIAGNOSIS — R070 Pain in throat: Secondary | ICD-10-CM | POA: Diagnosis not present

## 2014-08-18 DIAGNOSIS — E782 Mixed hyperlipidemia: Secondary | ICD-10-CM | POA: Diagnosis not present

## 2014-08-18 DIAGNOSIS — Z0000 Encounter for general adult medical examination without abnormal findings: Secondary | ICD-10-CM | POA: Diagnosis not present

## 2014-08-18 DIAGNOSIS — Z125 Encounter for screening for malignant neoplasm of prostate: Secondary | ICD-10-CM | POA: Diagnosis not present

## 2014-08-18 DIAGNOSIS — Z23 Encounter for immunization: Secondary | ICD-10-CM | POA: Diagnosis not present

## 2014-08-18 DIAGNOSIS — Z1389 Encounter for screening for other disorder: Secondary | ICD-10-CM | POA: Diagnosis not present

## 2014-08-18 DIAGNOSIS — R7309 Other abnormal glucose: Secondary | ICD-10-CM | POA: Diagnosis not present

## 2014-08-18 DIAGNOSIS — F324 Major depressive disorder, single episode, in partial remission: Secondary | ICD-10-CM | POA: Diagnosis not present

## 2014-08-18 DIAGNOSIS — M519 Unspecified thoracic, thoracolumbar and lumbosacral intervertebral disc disorder: Secondary | ICD-10-CM | POA: Diagnosis not present

## 2014-08-18 DIAGNOSIS — F419 Anxiety disorder, unspecified: Secondary | ICD-10-CM | POA: Diagnosis not present

## 2014-08-18 DIAGNOSIS — J449 Chronic obstructive pulmonary disease, unspecified: Secondary | ICD-10-CM | POA: Diagnosis not present

## 2014-08-28 DIAGNOSIS — M7581 Other shoulder lesions, right shoulder: Secondary | ICD-10-CM | POA: Diagnosis not present

## 2014-09-01 ENCOUNTER — Encounter: Payer: Self-pay | Admitting: Internal Medicine

## 2014-09-01 ENCOUNTER — Ambulatory Visit (INDEPENDENT_AMBULATORY_CARE_PROVIDER_SITE_OTHER): Payer: Medicare Other | Admitting: Internal Medicine

## 2014-09-01 VITALS — BP 112/64 | HR 66 | Ht 70.0 in | Wt 167.0 lb

## 2014-09-01 DIAGNOSIS — J449 Chronic obstructive pulmonary disease, unspecified: Secondary | ICD-10-CM | POA: Insufficient documentation

## 2014-09-01 DIAGNOSIS — R0600 Dyspnea, unspecified: Secondary | ICD-10-CM | POA: Insufficient documentation

## 2014-09-01 MED ORDER — UMECLIDINIUM-VILANTEROL 62.5-25 MCG/INH IN AEPB: INHALATION_SPRAY | RESPIRATORY_TRACT | Status: DC

## 2014-09-01 NOTE — Patient Instructions (Addendum)
Anoro one click each am and fill the rx if you feel it helps you get up your driveway without stopping  Don't use the anoro the morning of the test   Please schedule a follow up office visit in 6 weeks, call sooner if needed with pfts on return and we will refer you to rehab then

## 2014-09-01 NOTE — Progress Notes (Signed)
Subjective:    Patient ID: Mark Giles, male    DOB: 1946/03/10,    MRN: [id number]  HPI  77 yowm quit smoking 01/2014 with doe and dx as GOLD II copd as early as 05/06/12 but worse fatigue and sob since quit smoking so referred to pulmonary clinic 09/01/2014 by Dr Dr Theodoro Doing     09/01/2014 1st [facility] office visit/ Mark Giles   Chief Complaint  Patient presents with  . Pulmonary Consult    Referred by Dr. Deforest Hoyles for eval of COPD. Pt states that he was dxed with COPD back in 2014. He c/o DOE with exertion such as taking out of trash. He states that is breathing seems to get worse every day.   was exercising but stopped aroud 6 m prior to OV  Due to fatigue = sob  Onset was gradual, pattern is progressive to point where sob in shower, sweeping floor Does ok walking in a big grocery store/ driveway to house is 10 degree angle has to stop half way up No better on spiriva   No obvious day to day or daytime variabilty or assoc significant  cough or cp or chest tightness, subjective wheeze overt sinus or hb symptoms. No unusual exp hx or h/o childhood pna/ asthma or knowledge of premature birth.  Sleeping ok without nocturnal  or early am exacerbation  of respiratory  c/o's or need for noct saba. Also denies any obvious fluctuation of symptoms with weather or environmental changes or other aggravating or alleviating factors except as outlined above   Current Medications, Allergies, Complete Past Medical History, Past Surgical History, Family History, and Social History were reviewed in [employer] record.  ROS  The following are not active complaints unless bolded sore throat, dysphagia, dental problems, itching, sneezing,  nasal congestion or excess/ purulent secretions, ear ache,   fever, chills, sweats, unintended wt loss, pleuritic or exertional cp, hemoptysis,  orthopnea pnd or leg swelling, presyncope, palpitations, heartburn, abdominal pain, anorexia,  nausea, vomiting, diarrhea  or change in bowel or urinary habits, change in stools or urine, dysuria,hematuria,  rash, arthralgias, visual complaints, headache, numbness weakness or ataxia or problems with walking or coordination,  change in mood/affect or memory.         Review of Systems  Constitutional: Negative for fever, chills, activity change, appetite change and unexpected weight change.  HENT: Negative for congestion, dental problem, postnasal drip, rhinorrhea, sneezing, sore throat, trouble swallowing and voice change.   Eyes: Negative for visual disturbance.  Respiratory: Positive for cough and shortness of breath. Negative for choking.   Cardiovascular: Negative for chest pain and leg swelling.  Gastrointestinal: Negative for nausea, vomiting and abdominal pain.  Genitourinary: Negative for difficulty urinating.  Musculoskeletal: Positive for arthralgias.  Skin: Negative for rash.  Psychiatric/Behavioral: Negative for behavioral problems and confusion.       Objective:   Physical Exam amb wm nad  Wt Readings from Last 3 Encounters:  09/01/14 167 lb (75.751 kg)  10/22/12 160 lb (72.576 kg)  05/10/12 160 lb 9.6 oz (72.848 kg)    Vital signs reviewed   HEENT: nl dentition, turbinates, and orophanx. Nl external ear canals without cough reflex   NECK :  without JVD/Nodes/TM/ nl carotid upstrokes bilaterally   LUNGS: no acc muscle use, slt distant bs s wheeze/ hoover's pos toward end of insp/ mild/mod barrel shaped chest    CV:  RRR  no s3 or murmur or increase in P2,  no edema   ABD:  soft and nontender with nl excursion in the supine position. No bruits or organomegaly, bowel sounds nl  MS:  warm without deformities, calf tenderness, cyanosis or clubbing  SKIN: warm and dry without lesions    NEURO:  alert, approp, no deficits    I personally reviewed images and agree with radiology impression as follows:  CXR:  01/15/14 Findings consistent with chronic  obstructive pulmonary disease. No acute cardiopulmonary abnormality seen.         Assessment & Plan:

## 2014-09-01 NOTE — Assessment & Plan Note (Signed)
PFTs 05/06/12  FEV1  1.87 (55%) ratio 45 and dlco 43 corrects to 48   Relatively fixed pattern of ex intolerance typical of a GOLD C group in a patient with moderate airflow obst as of 05/06/12 but doing worse since quit smoking I strongly suspect due to deconditioning, not worsening copd  rec trial of anoro pending f/u here with repeat pfts as already failed LAMA as monotherapy    Each maintenance medication was reviewed in detail including most importantly the difference between maintenance and as needed and under what circumstances the prns are to be used.  Please see instructions for details which were reviewed in writing and the patient given a copy.

## 2014-10-13 ENCOUNTER — Other Ambulatory Visit: Payer: Self-pay | Admitting: Internal Medicine

## 2014-10-13 ENCOUNTER — Ambulatory Visit: Payer: BLUE CROSS/BLUE SHIELD | Admitting: Internal Medicine

## 2014-10-13 ENCOUNTER — Ambulatory Visit
Admission: RE | Admit: 2014-10-13 | Discharge: 2014-10-13 | Disposition: A | Discharge status: Home / Self Care | Payer: Medicare Other | Source: Ambulatory Visit | Attending: Internal Medicine | Admitting: Internal Medicine

## 2014-10-13 DIAGNOSIS — M542 Cervicalgia: Secondary | ICD-10-CM

## 2014-10-13 DIAGNOSIS — Z1389 Encounter for screening for other disorder: Secondary | ICD-10-CM | POA: Diagnosis not present

## 2014-10-13 DIAGNOSIS — M25511 Pain in right shoulder: Secondary | ICD-10-CM

## 2014-10-13 DIAGNOSIS — M47812 Spondylosis without myelopathy or radiculopathy, cervical region: Secondary | ICD-10-CM | POA: Diagnosis not present

## 2014-10-16 DIAGNOSIS — M7501 Adhesive capsulitis of right shoulder: Secondary | ICD-10-CM | POA: Diagnosis not present

## 2014-11-27 DIAGNOSIS — M7501 Adhesive capsulitis of right shoulder: Secondary | ICD-10-CM | POA: Diagnosis not present

## 2015-02-09 DIAGNOSIS — J449 Chronic obstructive pulmonary disease, unspecified: Secondary | ICD-10-CM | POA: Diagnosis not present

## 2015-02-09 DIAGNOSIS — R202 Paresthesia of skin: Secondary | ICD-10-CM | POA: Diagnosis not present

## 2015-02-09 DIAGNOSIS — F324 Major depressive disorder, single episode, in partial remission: Secondary | ICD-10-CM | POA: Diagnosis not present

## 2015-02-09 DIAGNOSIS — E782 Mixed hyperlipidemia: Secondary | ICD-10-CM | POA: Diagnosis not present

## 2015-02-09 DIAGNOSIS — R5383 Other fatigue: Secondary | ICD-10-CM | POA: Diagnosis not present

## 2015-02-15 ENCOUNTER — Other Ambulatory Visit: Payer: Self-pay | Admitting: Internal Medicine

## 2015-02-15 DIAGNOSIS — E0590 Thyrotoxicosis, unspecified without thyrotoxic crisis or storm: Secondary | ICD-10-CM | POA: Diagnosis not present

## 2015-02-17 ENCOUNTER — Ambulatory Visit
Admission: RE | Admit: 2015-02-17 | Discharge: 2015-02-17 | Disposition: A | Discharge status: Home / Self Care | Payer: Medicare Other | Source: Ambulatory Visit | Attending: Internal Medicine | Admitting: Internal Medicine

## 2015-02-17 DIAGNOSIS — E0590 Thyrotoxicosis, unspecified without thyrotoxic crisis or storm: Secondary | ICD-10-CM | POA: Diagnosis not present

## 2015-02-17 DIAGNOSIS — E042 Nontoxic multinodular goiter: Secondary | ICD-10-CM | POA: Diagnosis not present

## 2015-02-24 ENCOUNTER — Other Ambulatory Visit (HOSPITAL_COMMUNITY): Payer: Self-pay | Admitting: Internal Medicine

## 2015-02-24 DIAGNOSIS — E0500 Thyrotoxicosis with diffuse goiter without thyrotoxic crisis or storm: Secondary | ICD-10-CM

## 2015-02-26 ENCOUNTER — Other Ambulatory Visit (HOSPITAL_COMMUNITY): Payer: Self-pay | Admitting: Internal Medicine

## 2015-02-26 DIAGNOSIS — E0500 Thyrotoxicosis with diffuse goiter without thyrotoxic crisis or storm: Secondary | ICD-10-CM

## 2015-03-23 DIAGNOSIS — E042 Nontoxic multinodular goiter: Secondary | ICD-10-CM | POA: Diagnosis not present

## 2015-03-23 DIAGNOSIS — E0500 Thyrotoxicosis with diffuse goiter without thyrotoxic crisis or storm: Secondary | ICD-10-CM | POA: Diagnosis not present

## 2015-03-23 MED ORDER — SODIUM IODIDE I 131 CAPSULE
8.4000 | Freq: Once | INTRAVENOUS | Status: AC | PRN
  Administered 2015-03-23: 8.4 via ORAL

## 2015-03-24 ENCOUNTER — Ambulatory Visit (HOSPITAL_COMMUNITY)
Admission: RE | Admit: 2015-03-24 | Discharge: 2015-03-24 | Disposition: A | Discharge status: Home / Self Care | Payer: Medicare Other | Source: Ambulatory Visit | Attending: Internal Medicine | Admitting: Internal Medicine

## 2015-03-24 DIAGNOSIS — E0500 Thyrotoxicosis with diffuse goiter without thyrotoxic crisis or storm: Secondary | ICD-10-CM | POA: Diagnosis not present

## 2015-03-24 DIAGNOSIS — E042 Nontoxic multinodular goiter: Secondary | ICD-10-CM | POA: Diagnosis not present

## 2015-03-24 DIAGNOSIS — E0590 Thyrotoxicosis, unspecified without thyrotoxic crisis or storm: Secondary | ICD-10-CM | POA: Diagnosis not present

## 2015-03-24 DIAGNOSIS — R946 Abnormal results of thyroid function studies: Secondary | ICD-10-CM | POA: Diagnosis not present

## 2015-03-24 MED ORDER — SODIUM PERTECHNETATE TC 99M INJECTION
10.0000 | Freq: Once | INTRAVENOUS | Status: AC | PRN
  Administered 2015-03-24: 10.98 via INTRAVENOUS

## 2015-04-02 ENCOUNTER — Ambulatory Visit (HOSPITAL_COMMUNITY)
Admission: RE | Admit: 2015-04-02 | Discharge: 2015-04-02 | Disposition: A | Discharge status: Home / Self Care | Payer: Medicare Other | Source: Ambulatory Visit | Attending: Internal Medicine | Admitting: Internal Medicine

## 2015-04-02 DIAGNOSIS — E0500 Thyrotoxicosis with diffuse goiter without thyrotoxic crisis or storm: Secondary | ICD-10-CM | POA: Diagnosis not present

## 2015-04-02 DIAGNOSIS — E0590 Thyrotoxicosis, unspecified without thyrotoxic crisis or storm: Secondary | ICD-10-CM | POA: Diagnosis not present

## 2015-04-02 MED ORDER — SODIUM IODIDE I 131 CAPSULE: 21.0000 | Freq: Once | INTRAVENOUS | Status: DC | PRN

## 2015-04-05 DIAGNOSIS — E0500 Thyrotoxicosis with diffuse goiter without thyrotoxic crisis or storm: Secondary | ICD-10-CM | POA: Diagnosis not present

## 2015-04-20 DIAGNOSIS — E0500 Thyrotoxicosis with diffuse goiter without thyrotoxic crisis or storm: Secondary | ICD-10-CM | POA: Diagnosis not present

## 2015-05-11 DIAGNOSIS — E782 Mixed hyperlipidemia: Secondary | ICD-10-CM | POA: Diagnosis not present

## 2015-05-11 DIAGNOSIS — Z1389 Encounter for screening for other disorder: Secondary | ICD-10-CM | POA: Diagnosis not present

## 2015-05-11 DIAGNOSIS — E0500 Thyrotoxicosis with diffuse goiter without thyrotoxic crisis or storm: Secondary | ICD-10-CM | POA: Diagnosis not present

## 2015-05-11 DIAGNOSIS — Z23 Encounter for immunization: Secondary | ICD-10-CM | POA: Diagnosis not present

## 2015-05-11 DIAGNOSIS — F324 Major depressive disorder, single episode, in partial remission: Secondary | ICD-10-CM | POA: Diagnosis not present

## 2015-05-11 DIAGNOSIS — J449 Chronic obstructive pulmonary disease, unspecified: Secondary | ICD-10-CM | POA: Diagnosis not present

## 2015-06-08 DIAGNOSIS — E0500 Thyrotoxicosis with diffuse goiter without thyrotoxic crisis or storm: Secondary | ICD-10-CM | POA: Diagnosis not present

## 2015-07-13 DIAGNOSIS — E0590 Thyrotoxicosis, unspecified without thyrotoxic crisis or storm: Secondary | ICD-10-CM | POA: Diagnosis not present

## 2015-07-26 DIAGNOSIS — E0590 Thyrotoxicosis, unspecified without thyrotoxic crisis or storm: Secondary | ICD-10-CM | POA: Diagnosis not present

## 2015-07-26 DIAGNOSIS — R531 Weakness: Secondary | ICD-10-CM | POA: Diagnosis not present

## 2015-07-26 DIAGNOSIS — E0500 Thyrotoxicosis with diffuse goiter without thyrotoxic crisis or storm: Secondary | ICD-10-CM | POA: Diagnosis not present

## 2015-08-10 DIAGNOSIS — R531 Weakness: Secondary | ICD-10-CM | POA: Diagnosis not present

## 2015-08-10 DIAGNOSIS — E0500 Thyrotoxicosis with diffuse goiter without thyrotoxic crisis or storm: Secondary | ICD-10-CM | POA: Diagnosis not present

## 2015-08-25 DIAGNOSIS — Z0000 Encounter for general adult medical examination without abnormal findings: Secondary | ICD-10-CM | POA: Diagnosis not present

## 2015-08-25 DIAGNOSIS — M519 Unspecified thoracic, thoracolumbar and lumbosacral intervertebral disc disorder: Secondary | ICD-10-CM | POA: Diagnosis not present

## 2015-08-25 DIAGNOSIS — Z125 Encounter for screening for malignant neoplasm of prostate: Secondary | ICD-10-CM | POA: Diagnosis not present

## 2015-08-25 DIAGNOSIS — Z1389 Encounter for screening for other disorder: Secondary | ICD-10-CM | POA: Diagnosis not present

## 2015-08-25 DIAGNOSIS — E782 Mixed hyperlipidemia: Secondary | ICD-10-CM | POA: Diagnosis not present

## 2015-08-25 DIAGNOSIS — E559 Vitamin D deficiency, unspecified: Secondary | ICD-10-CM | POA: Diagnosis not present

## 2015-08-25 DIAGNOSIS — E0500 Thyrotoxicosis with diffuse goiter without thyrotoxic crisis or storm: Secondary | ICD-10-CM | POA: Diagnosis not present

## 2015-08-25 DIAGNOSIS — F324 Major depressive disorder, single episode, in partial remission: Secondary | ICD-10-CM | POA: Diagnosis not present

## 2015-08-25 DIAGNOSIS — J449 Chronic obstructive pulmonary disease, unspecified: Secondary | ICD-10-CM | POA: Diagnosis not present

## 2015-09-24 DIAGNOSIS — E0500 Thyrotoxicosis with diffuse goiter without thyrotoxic crisis or storm: Secondary | ICD-10-CM | POA: Diagnosis not present

## 2015-10-26 DIAGNOSIS — J449 Chronic obstructive pulmonary disease, unspecified: Secondary | ICD-10-CM | POA: Diagnosis not present

## 2015-10-26 DIAGNOSIS — R5383 Other fatigue: Secondary | ICD-10-CM | POA: Diagnosis not present

## 2015-10-26 DIAGNOSIS — E0500 Thyrotoxicosis with diffuse goiter without thyrotoxic crisis or storm: Secondary | ICD-10-CM | POA: Diagnosis not present

## 2015-10-26 DIAGNOSIS — R0602 Shortness of breath: Secondary | ICD-10-CM | POA: Diagnosis not present

## 2015-10-26 DIAGNOSIS — E890 Postprocedural hypothyroidism: Secondary | ICD-10-CM | POA: Diagnosis not present

## 2015-11-12 DIAGNOSIS — E0590 Thyrotoxicosis, unspecified without thyrotoxic crisis or storm: Secondary | ICD-10-CM | POA: Diagnosis not present

## 2016-01-05 ENCOUNTER — Ambulatory Visit (INDEPENDENT_AMBULATORY_CARE_PROVIDER_SITE_OTHER): Payer: Medicare Other | Admitting: Internal Medicine

## 2016-01-05 ENCOUNTER — Encounter: Payer: Self-pay | Admitting: Internal Medicine

## 2016-01-05 VITALS — BP 106/70 | HR 76 | Ht 70.0 in | Wt 159.0 lb

## 2016-01-05 DIAGNOSIS — J449 Chronic obstructive pulmonary disease, unspecified: Secondary | ICD-10-CM

## 2016-01-05 MED ORDER — FLUTICASONE-SALMETEROL 113-14 MCG/ACT IN AEPB: 2.0000 | INHALATION_SPRAY | Freq: Two times a day (BID) | RESPIRATORY_TRACT | Status: DC

## 2016-01-05 NOTE — Patient Instructions (Addendum)
Air duo 2 pffs each am if not improving ok to to change to Take 2 puffs first thing in am and then another 2 puffs about 12 hours later.   Work on inhaler technique:  relax and gently blow all the way out then take a nice smooth deep breath back in, triggering the inhaler at same time you start breathing in.  Hold for up to 5 seconds if you can. Blow out thru nose. Rinse and gargle with water when done     Please schedule a follow up visit in 3 months but call sooner if needed with PFTs on return

## 2016-01-05 NOTE — Progress Notes (Signed)
Subjective:    Patient ID: Mark Giles, male    DOB: [birth year],    MRN: [id number]    Brief patient profile:  52 yowm quit smoking 01/2014 with doe and dx as GOLD II copd as early as 05/06/12 but worse fatigue and sob since quit smoking so referred to pulmonary clinic 09/01/2014 by Dr Dr Theodoro Doing    History of Present Illness  09/01/2014 1st [name] office visit/ W[facility]   Chief Complaint  Patient presents with  . Pulmonary Consult    Referred by Dr. Deforest Hoyles for eval of COPD. Pt states that he was dxed with COPD back in 2014. He c/o DOE with exertion such as taking out of trash. He states that is breathing seems to get worse every day.   was exercising but stopped aroud 6 m prior to OV  Due to fatigue = sob  Onset was gradual, pattern is progressive to point where sob in shower, sweeping floor Does ok walking in a big grocery store/ driveway to house is 10 degree angle has to stop half way up No better on spiriva  rec Trial of anoro     01/05/2016  f/u ov/Wert re: GOLD II  Copd/ maint rx with anoro but can't afford it  Chief Complaint  Patient presents with  . Follow-up    Pt last seen March 2016- he was given Anoro, but just started taking med approx 2 wks ago. His breathing has improved. He has occ cough with grey sputum.     Doe = MMRC2 = can't walk a nl pace on a flat grade s sob but does fine slow and flat eg up to a mile slow pace anoro seemed to helped some but having to pay cash as doesn't have Med part D  No flares over the last year or need for saba   No obvious day to day or daytime variabilty or assoc significant  cough or cp or chest tightness, subjective wheeze overt sinus or hb symptoms. No unusual exp hx or h/o childhood pna/ asthma or knowledge of premature birth.  Sleeping ok without nocturnal  or early am exacerbation  of respiratory  c/o's or need for noct saba. Also denies any obvious fluctuation of symptoms with weather or environmental changes or  other aggravating or alleviating factors except as outlined above   Current Medications, Allergies, Complete Past Medical History, Past Surgical History, Family History, and Social History were reviewed in [employer] record.  ROS  The following are not active complaints unless bolded sore throat, dysphagia, dental problems, itching, sneezing,  nasal congestion or excess/ purulent secretions, ear ache,   fever, chills, sweats, unintended wt loss, pleuritic or exertional cp, hemoptysis,  orthopnea pnd or leg swelling, presyncope, palpitations, heartburn, abdominal pain, anorexia, nausea, vomiting, diarrhea  or change in bowel or urinary habits, change in stools or urine, dysuria,hematuria,  rash, arthralgias, visual complaints, headache, numbness weakness or ataxia or problems with walking or coordination,  change in mood/affect or memory.             Objective:   Physical Exam amb wm nad  Wt Readings from Last 3 Encounters:  01/05/16 159 lb (72.122 kg)  09/01/14 167 lb (75.751 kg)  10/22/12 160 lb (72.576 kg)    Vital signs reviewed    HEENT: nl dentition, turbinates, and orophanx. Nl external ear canals without cough reflex   NECK :  without JVD/Nodes/TM/ nl carotid upstrokes bilaterally  LUNGS: no acc muscle use, slt distant bs s wheeze/ hoover's pos toward end of insp/ mild/mod barrel shaped chest    CV:  RRR  no s3 or murmur or increase in P2, no edema   ABD:  soft and nontender with nl excursion in the supine position. No bruits or organomegaly, bowel sounds nl  MS:  warm without deformities, calf tenderness, cyanosis or clubbing  SKIN: warm and dry without lesions    NEURO:  alert, approp, no deficits           Assessment & Plan:

## 2016-01-06 NOTE — Assessment & Plan Note (Addendum)
PFTs 05/06/12  FEV1  1.87 (55%) ratio 45 and dlco 43 corrects to 48  - trial of anoro 09/01/2014 >  Changed to airduo 01/06/2016 as could not afford anoro cash basis - 01/05/2016  After extensive coaching inhaler  effectiveness =    [id number] with respiclick mode) > try airduo 113 2q am and 2 qpm only if needed   He has classic GOLD Group B symptoms but can't afford any of the lamas or labas so best bet is airduo 2 each am and add the other 2 pffs if not doing well  And then return for pfts in 3 m since not done in > 2 y  Formulary restrictions will be an ongoing challenge for the forseable future and I would be happy to pick an alternative if the pt will first  provide me a list of them but pt  will need to return here for training for any new device that is required eg dpi vs hfa vs respimat.    In meantime we can always provide samples so the patient never runs out of any needed respiratory medications.   Each maintenance medication was reviewed in detail including most importantly the difference between maintenance and as needed and under what circumstances the prns are to be used.  Please see instructions for details which were reviewed in writing and the patient given a copy.

## 2016-01-10 ENCOUNTER — Telehealth: Payer: Self-pay | Admitting: Internal Medicine

## 2016-01-10 NOTE — Telephone Encounter (Signed)
LMTCB for pt 

## 2016-01-10 NOTE — Telephone Encounter (Signed)
Pharmacy called stating that the pt asked them to hold the airduo until next month.  Pharmacy stated that they do not have the pts updated insurance information, so it will not let them run the new medication to see if this is going to be covered by his insurance.  Will forward back to MW to make him aware.

## 2016-01-10 NOTE — Telephone Encounter (Signed)
He told me he had no insurance to apply to meds and he would be using cash - either he is mistaken or his pharmacy is.  If there is a formulary we can pick I'm happy to do so, otherwise provide samples until we sort this out

## 2016-01-11 ENCOUNTER — Telehealth: Payer: Self-pay | Admitting: Internal Medicine

## 2016-01-11 NOTE — Telephone Encounter (Signed)
See previous telephone encounter- pt states that he did not fill airduo because he still has an inhaler and does not need a refill at this time.  Pt states he does not have drug coverage but he has a copay assistance coupon, so this should be run as a cash pay.  Nothing further needed.

## 2016-02-29 ENCOUNTER — Other Ambulatory Visit: Payer: Self-pay | Admitting: Internal Medicine

## 2016-02-29 DIAGNOSIS — R042 Hemoptysis: Secondary | ICD-10-CM | POA: Diagnosis not present

## 2016-02-29 DIAGNOSIS — F324 Major depressive disorder, single episode, in partial remission: Secondary | ICD-10-CM | POA: Diagnosis not present

## 2016-02-29 DIAGNOSIS — E782 Mixed hyperlipidemia: Secondary | ICD-10-CM | POA: Diagnosis not present

## 2016-02-29 DIAGNOSIS — J449 Chronic obstructive pulmonary disease, unspecified: Secondary | ICD-10-CM

## 2016-02-29 DIAGNOSIS — E890 Postprocedural hypothyroidism: Secondary | ICD-10-CM | POA: Diagnosis not present

## 2016-03-07 DIAGNOSIS — E890 Postprocedural hypothyroidism: Secondary | ICD-10-CM | POA: Diagnosis not present

## 2016-03-07 DIAGNOSIS — E0500 Thyrotoxicosis with diffuse goiter without thyrotoxic crisis or storm: Secondary | ICD-10-CM | POA: Diagnosis not present

## 2016-03-07 DIAGNOSIS — R5383 Other fatigue: Secondary | ICD-10-CM | POA: Diagnosis not present

## 2016-03-13 ENCOUNTER — Ambulatory Visit
Admission: RE | Admit: 2016-03-13 | Discharge: 2016-03-13 | Disposition: A | Discharge status: Home / Self Care | Payer: Medicare Other | Source: Ambulatory Visit | Attending: Internal Medicine | Admitting: Internal Medicine

## 2016-03-13 DIAGNOSIS — J449 Chronic obstructive pulmonary disease, unspecified: Secondary | ICD-10-CM

## 2016-03-13 DIAGNOSIS — R042 Hemoptysis: Secondary | ICD-10-CM

## 2016-03-13 DIAGNOSIS — J90 Pleural effusion, not elsewhere classified: Secondary | ICD-10-CM | POA: Diagnosis not present

## 2016-03-13 DIAGNOSIS — J189 Pneumonia, unspecified organism: Secondary | ICD-10-CM | POA: Diagnosis not present

## 2016-03-13 MED ORDER — IOPAMIDOL (ISOVUE-300) INJECTION 61%
75.0000 mL | Freq: Once | INTRAVENOUS | Status: AC | PRN
  Administered 2016-03-13: 75 mL via INTRAVENOUS

## 2016-03-17 ENCOUNTER — Encounter: Payer: Self-pay | Admitting: Internal Medicine

## 2016-03-17 ENCOUNTER — Ambulatory Visit (INDEPENDENT_AMBULATORY_CARE_PROVIDER_SITE_OTHER): Payer: Medicare Other | Admitting: Internal Medicine

## 2016-03-17 VITALS — BP 90/60 | HR 80 | Ht 69.5 in | Wt 167.0 lb

## 2016-03-17 DIAGNOSIS — J449 Chronic obstructive pulmonary disease, unspecified: Secondary | ICD-10-CM

## 2016-03-17 DIAGNOSIS — J189 Pneumonia, unspecified organism: Secondary | ICD-10-CM | POA: Diagnosis not present

## 2016-03-17 LAB — PULMONARY FUNCTION TEST
DL/VA % pred: 34 %
DL/VA: 1.56 ml/min/mmHg/L
DLCO UNC % PRED: 44 %
DLCO unc: 13.98 ml/min/mmHg
FEF 25-75 PRE: 0.59 L/s
FEF 25-75 Post: 0.75 L/sec
FEF2575-%CHANGE-POST: 26 %
FEF2575-%Pred-Post: 30 %
FEF2575-%Pred-Pre: 23 %
FEV1-%CHANGE-POST: 6 %
FEV1-%PRED-POST: 44 %
FEV1-%PRED-PRE: 41 %
FEV1-POST: 1.44 L
FEV1-Pre: 1.36 L
FEV1FVC-%CHANGE-POST: -6 %
FEV1FVC-%Pred-Pre: 72 %
FEV6-%CHANGE-POST: 11 %
FEV6-%PRED-POST: 66 %
FEV6-%PRED-PRE: 59 %
FEV6-PRE: 2.47 L
FEV6-Post: 2.75 L
FEV6FVC-%CHANGE-POST: -2 %
FEV6FVC-%PRED-PRE: 103 %
FEV6FVC-%Pred-Post: 100 %
FVC-%CHANGE-POST: 13 %
FVC-%Pred-Post: 65 %
FVC-%Pred-Pre: 57 %
FVC-PRE: 2.53 L
FVC-Post: 2.88 L
POST FEV6/FVC RATIO: 96 %
PRE FEV6/FVC RATIO: 98 %
Post FEV1/FVC ratio: 50 %
Pre FEV1/FVC ratio: 54 %
RV % PRED: 188 %
RV: 4.52 L
TLC % pred: 106 %
TLC: 7.37 L

## 2016-03-17 MED ORDER — FLUTICASONE-SALMETEROL 113-14 MCG/ACT IN AEPB: 1.0000 | INHALATION_SPRAY | Freq: Two times a day (BID) | RESPIRATORY_TRACT | 11 refills | Status: DC

## 2016-03-17 NOTE — Progress Notes (Signed)
Subjective:    Patient ID: Mark Giles, male    DOB: December 20, 1945,    MRN: [id number]    Brief patient profile:  55 yowm quit smoking 01/2014 with doe and dx as GOLD II copd as early as 05/06/12 but worse fatigue and sob since quit smoking so referred to pulmonary clinic 09/01/2014 by Dr Dr Theodoro Doing     History of Present Illness  09/01/2014 1st [facility] office visit/ Mark Giles   Chief Complaint  Patient presents with  . Pulmonary Consult    Referred by Dr. Deforest Hoyles for eval of COPD. Pt states that he was dxed with COPD back in 2014. He c/o DOE with exertion such as taking out of trash. He states that is breathing seems to get worse every day.   was exercising but stopped aroud 6 m prior to OV  Due to fatigue = sob  Onset was gradual, pattern is progressive to point where sob in shower, sweeping floor Does ok walking in a big grocery store/ driveway to house is 10 degree angle has to stop half way up No better on spiriva  rec Trial of anoro     01/05/2016  f/u ov/Mark Giles re: GOLD II  Copd/ maint rx with anoro but can't afford it  Chief Complaint  Patient presents with  . Follow-up    Pt last seen March 2016- he was given Anoro, but just started taking med approx 2 wks ago. His breathing has improved. He has occ cough with grey sputum.   Doe = MMRC2 = can't walk a nl pace on a flat grade s sob but does fine slow and flat eg up to a mile slow pace anoro seemed to helped some but having to pay cash as doesn't have Med part D rec Air duo 2 pffs each am if not improving ok to to change to Take 2 puffs first thing in am and then another 2 puffs about 12 hours later.  Work on inhaler technique:  relax and gently blow all the way out    03/17/2016  f/u ov/Mark Giles re:  Copd III copd/ maint on airduo 2 each am  Chief Complaint  Patient presents with  . Follow-up    PFT done today. Pt states had hemoptysis 2 wks ago. CT Chest was done 9/11/7- order by Dr Lysle Rubens. Pt states he was told he has  PNA and is currently taking Augmentin. He states his breathing is doing well and he denies any co's today.    despite acute worsening 2 weeks prior to OV  Doing better this week so completed pfts previously scheduled.  contiues doe = mmrc2/still struggling with med costs and has no saba    No obvious day to day or daytime variabilty or assoc significant   cp or chest tightness, subjective wheeze overt sinus or hb symptoms. No unusual exp hx or h/o childhood pna/ asthma or knowledge of premature birth.  Sleeping ok without nocturnal  or early am exacerbation  of respiratory  c/o's or need for noct saba. Also denies any obvious fluctuation of symptoms with weather or environmental changes or other aggravating or alleviating factors except as outlined above   Current Medications, Allergies, Complete Past Medical History, Past Surgical History, Family History, and Social History were reviewed in [employer] record.  ROS  The following are not active complaints unless bolded sore throat, dysphagia, dental problems, itching, sneezing,  nasal congestion or excess/ purulent secretions, ear ache,  fever, chills, sweats, unintended wt loss, pleuritic or exertional cp, hemoptysis resolved ,  orthopnea pnd or leg swelling, presyncope, palpitations, heartburn, abdominal pain, anorexia, nausea, vomiting, diarrhea  or change in bowel or urinary habits, change in stools or urine, dysuria,hematuria,  rash, arthralgias, visual complaints, headache, numbness weakness or ataxia or problems with walking or coordination,  change in mood/affect or memory.             Objective:   Physical Exam  amb wm nad  03/17/2016        167   01/05/16 159 lb (72.122 kg)  09/01/14 167 lb (75.751 kg)  10/22/12 160 lb (72.576 kg)    Vital signs reviewed    HEENT: nl dentition, turbinates, and orophanx. Nl external ear canals without cough reflex   NECK :  without JVD/Nodes/TM/ nl carotid upstrokes  bilaterally   LUNGS: no acc muscle use, barrel contour chest/ min insp and exp rhonchi bilaterally    CV:  RRR  no s3 or murmur or increase in P2, no edema   ABD:  soft and nontender with nl excursion in the supine position. No bruits or organomegaly, bowel sounds nl  MS:  warm without deformities, calf tenderness, cyanosis or clubbing  SKIN: warm and dry without lesions    NEURO:  alert, approp, no deficits    I personally reviewed images and agree with radiology impression as follows:  CT with contrast Chest 03/13/16 1. Findings are most consistent with multi lobar pneumonia with patchy airspace opacities in both lower lobes and the right middle lobe. There is a small loculated right pleural effusion. 2. Nonspecific prominent mediastinal lymph nodes, potentially reactive.           Assessment & Plan:

## 2016-03-17 NOTE — Patient Instructions (Signed)
Change air duo to one puff each am and add second dose in evening if needed  Please schedule a follow up visit in 6 months but call sooner if needed

## 2016-03-17 NOTE — Progress Notes (Signed)
PFT performed today. 

## 2016-03-18 DIAGNOSIS — J189 Pneumonia, unspecified organism: Secondary | ICD-10-CM | POA: Insufficient documentation

## 2016-03-18 NOTE — Assessment & Plan Note (Signed)
PFTs 05/06/12  FEV1  1.87 (55%) ratio 45 and dlco 43 corrects to 48  - trial of anoro 09/01/2014 >  Changed to airduo 01/06/2016 as could not afford anoro cash basis - 01/05/2016  After extensive coaching inhaler  effectiveness =    [id number] with respiclick mode) > try airduo 113 2bid - PFT's  03/17/2016  FEV1 1.44 (44 % ) ratio 50  p 6 % improvement from saba p nothing prior to study with DLCO  44 % corrects to 34  % for alv volume    Doing better despite apparent pna w/in last 2 weeks on airduo thought the dose was inadvertently rec to use 2bid and he only uses 2 qam, corrected today to one bid (generic advair) on airduo  I had an extended discussion with the patient reviewing all relevant studies completed to date and  lasting 15 to 20 minutes of a 25 minute visit    Each maintenance medication was reviewed in detail including most importantly the difference between maintenance and prns and under what circumstances the prns are to be triggered using an action plan format that is not reflected in the computer generated alphabetically organized AVS.    Please see instructions for details which were reviewed in writing and the patient given a copy highlighting the part that I personally wrote and discussed at today's ov.

## 2016-03-18 NOTE — Assessment & Plan Note (Signed)
Dx 03/13/16 by CT chest rx with augmentin x 14 days per PC   Follow up per Primary Care planned  > f/u for this problem here prn

## 2016-03-29 ENCOUNTER — Other Ambulatory Visit: Payer: Self-pay | Admitting: Internal Medicine

## 2016-03-29 DIAGNOSIS — R05 Cough: Secondary | ICD-10-CM

## 2016-03-29 DIAGNOSIS — R0602 Shortness of breath: Secondary | ICD-10-CM

## 2016-03-29 DIAGNOSIS — R059 Cough, unspecified: Secondary | ICD-10-CM

## 2016-03-29 DIAGNOSIS — J189 Pneumonia, unspecified organism: Secondary | ICD-10-CM

## 2016-04-05 ENCOUNTER — Ambulatory Visit
Admission: RE | Admit: 2016-04-05 | Discharge: 2016-04-05 | Disposition: A | Discharge status: Home / Self Care | Payer: Medicare Other | Source: Ambulatory Visit | Attending: Internal Medicine | Admitting: Internal Medicine

## 2016-04-05 DIAGNOSIS — R059 Cough, unspecified: Secondary | ICD-10-CM

## 2016-04-05 DIAGNOSIS — J189 Pneumonia, unspecified organism: Secondary | ICD-10-CM

## 2016-04-05 DIAGNOSIS — R05 Cough: Secondary | ICD-10-CM

## 2016-04-05 DIAGNOSIS — R0602 Shortness of breath: Secondary | ICD-10-CM

## 2016-04-05 MED ORDER — IOPAMIDOL (ISOVUE-300) INJECTION 61%
75.0000 mL | Freq: Once | INTRAVENOUS | Status: AC | PRN
  Administered 2016-04-05: 75 mL via INTRAVENOUS

## 2016-05-16 ENCOUNTER — Other Ambulatory Visit: Payer: Self-pay | Admitting: Internal Medicine

## 2016-05-16 ENCOUNTER — Ambulatory Visit
Admission: RE | Admit: 2016-05-16 | Discharge: 2016-05-16 | Disposition: A | Discharge status: Home / Self Care | Payer: Medicare Other | Source: Ambulatory Visit | Attending: Internal Medicine | Admitting: Internal Medicine

## 2016-05-16 DIAGNOSIS — R05 Cough: Secondary | ICD-10-CM | POA: Diagnosis not present

## 2016-05-16 DIAGNOSIS — J189 Pneumonia, unspecified organism: Secondary | ICD-10-CM

## 2016-08-29 ENCOUNTER — Other Ambulatory Visit: Payer: Self-pay | Admitting: Internal Medicine

## 2016-08-29 ENCOUNTER — Ambulatory Visit
Admission: RE | Admit: 2016-08-29 | Discharge: 2016-08-29 | Disposition: A | Discharge status: Home / Self Care | Payer: Medicare Other | Source: Ambulatory Visit | Attending: Internal Medicine | Admitting: Internal Medicine

## 2016-08-29 DIAGNOSIS — R0602 Shortness of breath: Secondary | ICD-10-CM | POA: Diagnosis not present

## 2016-08-29 DIAGNOSIS — R9389 Abnormal findings on diagnostic imaging of other specified body structures: Secondary | ICD-10-CM

## 2016-08-30 DIAGNOSIS — E890 Postprocedural hypothyroidism: Secondary | ICD-10-CM | POA: Diagnosis not present

## 2016-08-30 DIAGNOSIS — N400 Enlarged prostate without lower urinary tract symptoms: Secondary | ICD-10-CM | POA: Diagnosis not present

## 2016-08-30 DIAGNOSIS — M519 Unspecified thoracic, thoracolumbar and lumbosacral intervertebral disc disorder: Secondary | ICD-10-CM | POA: Diagnosis not present

## 2016-08-30 DIAGNOSIS — D696 Thrombocytopenia, unspecified: Secondary | ICD-10-CM | POA: Diagnosis not present

## 2016-08-30 DIAGNOSIS — E782 Mixed hyperlipidemia: Secondary | ICD-10-CM | POA: Diagnosis not present

## 2016-08-30 DIAGNOSIS — G8929 Other chronic pain: Secondary | ICD-10-CM | POA: Diagnosis not present

## 2016-08-30 DIAGNOSIS — F419 Anxiety disorder, unspecified: Secondary | ICD-10-CM | POA: Diagnosis not present

## 2016-08-30 DIAGNOSIS — F324 Major depressive disorder, single episode, in partial remission: Secondary | ICD-10-CM | POA: Diagnosis not present

## 2016-08-30 DIAGNOSIS — Z0000 Encounter for general adult medical examination without abnormal findings: Secondary | ICD-10-CM | POA: Diagnosis not present

## 2016-08-30 DIAGNOSIS — E559 Vitamin D deficiency, unspecified: Secondary | ICD-10-CM | POA: Diagnosis not present

## 2016-08-30 DIAGNOSIS — J449 Chronic obstructive pulmonary disease, unspecified: Secondary | ICD-10-CM | POA: Diagnosis not present

## 2016-08-30 DIAGNOSIS — Z1389 Encounter for screening for other disorder: Secondary | ICD-10-CM | POA: Diagnosis not present

## 2016-09-25 DIAGNOSIS — M519 Unspecified thoracic, thoracolumbar and lumbosacral intervertebral disc disorder: Secondary | ICD-10-CM | POA: Diagnosis not present

## 2016-09-25 DIAGNOSIS — G894 Chronic pain syndrome: Secondary | ICD-10-CM | POA: Diagnosis not present

## 2016-10-03 ENCOUNTER — Ambulatory Visit: Payer: Medicare Other | Admitting: Internal Medicine

## 2016-10-10 ENCOUNTER — Ambulatory Visit (INDEPENDENT_AMBULATORY_CARE_PROVIDER_SITE_OTHER): Payer: Medicare Other | Admitting: Internal Medicine

## 2016-10-10 ENCOUNTER — Encounter: Payer: Self-pay | Admitting: Internal Medicine

## 2016-10-10 VITALS — BP 106/70 | HR 75 | Ht 70.0 in | Wt 181.8 lb

## 2016-10-10 DIAGNOSIS — J449 Chronic obstructive pulmonary disease, unspecified: Secondary | ICD-10-CM

## 2016-10-10 NOTE — Patient Instructions (Addendum)
No change in air duo = one puff in am  And then add the second dose if losing ground  Remember to rinse and gargle after use of the air duo    If you are satisfied with your treatment plan,  let your doctor know and he/she can either refill your medications or you can return here when your prescription runs out.     If in any way you are not 100% satisfied,  please tell us.  If 100% better, tell your friends!  Pulmonary follow up is as needed

## 2016-10-10 NOTE — Progress Notes (Signed)
Subjective:    Patient ID: Mark Giles, male    DOB: [birth year],    MRN: [id number]    Brief patient profile:  77 yowm quit smoking 01/2014 with doe and dx as GOLD II copd as early as 05/06/12 but worse fatigue and sob since quit smoking so referred to pulmonary clinic 09/01/2014 by Dr Dr Theodoro Doing     History of Present Illness  09/01/2014 1st [facility] office visit/ Wert   Chief Complaint  Patient presents with  . Pulmonary Consult    Referred by Dr. Deforest Hoyles for eval of COPD. Pt states that he was dxed with COPD back in 2014. He c/o DOE with exertion such as taking out of trash. He states that is breathing seems to get worse every day.   was exercising but stopped aroud 6 m prior to OV  Due to fatigue = sob  Onset was gradual, pattern is progressive to point where sob in shower, sweeping floor Does ok walking in a big grocery store/ driveway to house is 10 degree angle has to stop half way up No better on spiriva  rec Trial of anoro     01/05/2016  f/u ov/Wert re: GOLD II  Copd/ maint rx with anoro but can't afford it  Chief Complaint  Patient presents with  . Follow-up    Pt last seen March 2016- he was given Anoro, but just started taking med approx 2 wks ago. His breathing has improved. He has occ cough with grey sputum.   Doe = MMRC2 = can't walk a nl pace on a flat grade s sob but does fine slow and flat eg up to a mile slow pace anoro seemed to helped some but having to pay cash as doesn't have Med part D rec Air duo 2 pffs each am if not improving ok to to change to Take 2 puffs first thing in am and then another 2 puffs about 12 hours later.  Work on inhaler technique:  relax and gently blow all the way out    03/17/2016  f/u ov/Wert re:  Copd III copd/ maint on airduo 2 each am  Chief Complaint  Patient presents with  . Follow-up    PFT done today. Pt states had hemoptysis 2 wks ago. CT Chest was done 9/11/7- order by Dr Lysle Rubens. Pt states he was told he has  PNA and is currently taking Augmentin. He states his breathing is doing well and he denies any co's today.    despite acute worsening 2 weeks prior to OV  Doing better this week so completed pfts previously scheduled.  contiues doe = mmrc2/still struggling with med costs and has no saba  rec Change air duo to one puff each am and add second dose in evening if needed    10/10/2016  f/u ov/Wert re: GOLD III copd/ maint air duo one puff bid and paying retail for all meds  Chief Complaint  Patient presents with  . Follow-up    53mo rov. pt states breathing is doing well. no new co's today.   doe = one mile slow pace but varies some = MMRC1 = can walk nl pace, flat grade, can't hurry or go uphills or steps s sob    No obvious day to day or daytime variability or assoc excess/ purulent sputum or mucus plugs or hemoptysis or cp or chest tightness, subjective wheeze or overt sinus or hb symptoms. No unusual exp hx or h/o childhood  pna/ asthma or knowledge of premature birth.  Sleeping ok without nocturnal  or early am exacerbation  of respiratory  c/o's or need for noct saba. Also denies any obvious fluctuation of symptoms with weather or environmental changes or other aggravating or alleviating factors except as outlined above   Current Medications, Allergies, Complete Past Medical History, Past Surgical History, Family History, and Social History were reviewed in [employer] record.  ROS  The following are not active complaints unless bolded sore throat, dysphagia, dental problems, itching, sneezing,  nasal congestion or excess/ purulent secretions, ear ache,   fever, chills, sweats, unintended wt loss, classically pleuritic or exertional cp,  orthopnea pnd or leg swelling, presyncope, palpitations, abdominal pain, anorexia, nausea, vomiting, diarrhea  or change in bowel or bladder habits, change in stools or urine, dysuria,hematuria,  rash, arthralgias, visual complaints,  headache, numbness, weakness or ataxia or problems with walking or coordination,  change in mood/affect or memory.                        Objective:   Physical Exam  amb wm nad  / mild hoarseness/ mild pseudowheeze  10/10/2016        182  03/17/2016        167   01/05/16 159 lb (72.122 kg)  09/01/14 167 lb (75.751 kg)  10/22/12 160 lb (72.576 kg)    Vital signs reviewed - Note on arrival 02 sats  94% on RA      HEENT: nl dentition, turbinates, and orophanx. Nl external ear canals without cough reflex   NECK :  without JVD/Nodes/TM/ nl carotid upstrokes bilaterally   LUNGS: no acc muscle use, barrel contour  Distant bs bilaterally s wheeze    CV:  RRR  no s3 or murmur or increase in P2, no edema   ABD:  soft and nontender with nl excursion in the supine position. No bruits or organomegaly, bowel sounds nl  MS:  warm without deformities, calf tenderness, cyanosis or clubbing  SKIN: warm and dry without lesions    NEURO:  alert, approp, no deficits        I personally reviewed images and agree with radiology impression as follows:  CXR:   08/29/16 1. No acute cardiopulmonary abnormalities. 2. COPD/emphysema.        Assessment & Plan:

## 2016-10-15 NOTE — Assessment & Plan Note (Signed)
PFTs 05/06/12  FEV1  1.87 (55%) ratio 45 and dlco 43 corrects to 48  - trial of anoro 09/01/2014 >  Changed to airduo 01/06/2016 as could not afford anoro cash basis - 01/05/2016  After extensive coaching inhaler  effectiveness =    75% with respiclick mode) > try airduo 113 2bid - PFT's  03/17/2016  FEV1 1.44 (44 % ) ratio 50  p 6 % improvement from saba p nothing prior to study with DLCO  44 % corrects to 34  % for alv volume    At this point he is content with air duo but note it's likely contributing to his upper airway symptoms and would strongly prefer symb 160 2bid but says cannot afford it so best option is to drop down to one pff each am and add the 2nd puff in pm prn   I had an extended final summary discussion with the patient reviewing all relevant studies completed to date and  lasting 15 to 20 minutes of a 25 minute visit on the following issues:     I reviewed the Fletcher curve with the patient that basically indicates  if you quit smoking when your best day FEV1 is still  preserved (as is still relatively true here)  it is highly unlikely you will progress to severe disease and informed the patient there was  no medication on the market that has proven to alter the curve/ its downward trajectory  or the likelihood of progression of their disease(unlike other chronic medical conditions such as atheroclerosis where we do think we can change the natural hx with risk reducing meds eg his mevacor )    Therefore stopping smoking and maintaining abstinence is the most important aspect of care, not choice of inhalers or for that matter, doctors.    rx is for symptoms / f/u is prn

## 2016-11-28 DIAGNOSIS — R5383 Other fatigue: Secondary | ICD-10-CM | POA: Diagnosis not present

## 2016-11-28 DIAGNOSIS — J449 Chronic obstructive pulmonary disease, unspecified: Secondary | ICD-10-CM | POA: Diagnosis not present

## 2016-11-28 DIAGNOSIS — M519 Unspecified thoracic, thoracolumbar and lumbosacral intervertebral disc disorder: Secondary | ICD-10-CM | POA: Diagnosis not present

## 2016-11-28 DIAGNOSIS — F324 Major depressive disorder, single episode, in partial remission: Secondary | ICD-10-CM | POA: Diagnosis not present

## 2016-11-28 DIAGNOSIS — E890 Postprocedural hypothyroidism: Secondary | ICD-10-CM | POA: Diagnosis not present

## 2016-11-28 DIAGNOSIS — R0609 Other forms of dyspnea: Secondary | ICD-10-CM | POA: Diagnosis not present

## 2016-11-28 DIAGNOSIS — G8929 Other chronic pain: Secondary | ICD-10-CM | POA: Diagnosis not present

## 2016-11-28 DIAGNOSIS — E782 Mixed hyperlipidemia: Secondary | ICD-10-CM | POA: Diagnosis not present

## 2016-11-28 DIAGNOSIS — D696 Thrombocytopenia, unspecified: Secondary | ICD-10-CM | POA: Diagnosis not present

## 2016-11-30 ENCOUNTER — Ambulatory Visit: Payer: Medicare Other | Admitting: Cardiology

## 2016-12-15 ENCOUNTER — Ambulatory Visit (INDEPENDENT_AMBULATORY_CARE_PROVIDER_SITE_OTHER): Payer: Medicare Other | Admitting: Cardiology

## 2016-12-15 ENCOUNTER — Encounter: Payer: Self-pay | Admitting: Cardiology

## 2016-12-15 VITALS — BP 102/70 | HR 76 | Ht 70.0 in | Wt 180.0 lb

## 2016-12-15 DIAGNOSIS — M79604 Pain in right leg: Secondary | ICD-10-CM

## 2016-12-15 DIAGNOSIS — F17299 Nicotine dependence, other tobacco product, with unspecified nicotine-induced disorders: Secondary | ICD-10-CM

## 2016-12-15 DIAGNOSIS — M79605 Pain in left leg: Secondary | ICD-10-CM

## 2016-12-15 DIAGNOSIS — R0609 Other forms of dyspnea: Secondary | ICD-10-CM | POA: Diagnosis not present

## 2016-12-15 DIAGNOSIS — R0602 Shortness of breath: Secondary | ICD-10-CM | POA: Diagnosis not present

## 2016-12-15 DIAGNOSIS — R0600 Dyspnea, unspecified: Secondary | ICD-10-CM

## 2016-12-15 DIAGNOSIS — E785 Hyperlipidemia, unspecified: Secondary | ICD-10-CM | POA: Diagnosis not present

## 2016-12-15 DIAGNOSIS — J439 Emphysema, unspecified: Secondary | ICD-10-CM

## 2016-12-15 NOTE — Patient Instructions (Addendum)
   Medication Instructions:   Your physician recommends that you continue on your current medications as directed. Please refer to the Current Medication list given to you today.    If you need a refill on your cardiac medications before your next appointment, please call your pharmacy.  Labwork: NONE ORDERED  TODAY    Testing/Procedures: Your physician has requested that you have an echocardiogram. Echocardiography is a painless test that uses sound waves to create images of your heart. It provides your doctor with information about the size and shape of your heart and how well your heart's chambers and valves are working. This procedure takes approximately one hour. There are no restrictions for this procedure.  Your physician has requested that you have a lexiscan myoview. For further information please visit [email or web address]. Please follow instruction sheet, as given.   Your physician has requested that you have a lower extremity arterial exercise duplex. During this test, exercise and ultrasound are used to evaluate arterial blood flow in the legs. Allow one hour for this exam. There are no restrictions or special instructions.   Your physician has requested that you have an ankle brachial index (ABI). During this test an ultrasound and blood pressure cuff are used to evaluate the arteries that supply the arms and legs with blood. Allow thirty minutes for this exam. There are no restrictions or special instructions.    Follow-Up:  AFTER TESTING WITH DR Tamala Julian    Any Other Special Instructions Will Be Listed Below (If Applicable)

## 2016-12-15 NOTE — Progress Notes (Signed)
Cardiology Office Note:    The patient has been seen in conjunction with Mark Perch, NP. All aspects of care have been considered and discussed. The patient has been personally interviewed, examined, and all clinical data has been reviewed.   The major complaint is dyspnea on exertion and leg fatigue on exertion.  We are concerned that dyspnea may be multifactorial with a cardiac component. 2-D Doppler echocardiogram and myocardial perfusion imaging is ordered.  Leg fatigue is not related to COPD but rather obstructive peripheral vascular disease. A bilateral lower extremity Doppler study will be performed to determine severity. Pedal pulses are significantly diminished.   Date:  12/15/2016   ID:  Mark Giles, DOB 1945-09-23, MRN [id number]  PCP:  Mark Low, MD  Cardiologist:   New - Mark Mark Giles  Referring MD: Mark Low, MD   Chief Complaint  Patient presents with  . Fatigue  . Shortness of Breath    with exertion    History of Present Illness:    Mark Giles is a 71 y.o. male who is being seen today for the evaluation of fatigue, DOE, History of COPD and evaluation for pulmonary hypertension, ischemia at the request of Mark Low, MD. Prior office notes of Mark Giles and Mark Giles reviewed. The patient has no previous cardiac history. He reports having had a normal stress test 4 or so years ago which I have been unable to find.  Mark Giles primary complaint is of fatigue. About 2 years ago he was exercising without difficulty and now he cannot think of exercising. He tries to walk about a mile a day but has to stop 3-4 times due to leg pain which subsides when he stops walking. He has mild dyspnea on more strenuous exertion like brisk walking for a mile or more. He is able to climb a flight of stairs without problems. He reports that his breathing issues have been substantially improved on maintenance inhalers. The patient had pulmonary function test 03/17/16  with FEV1 1.44 and was noted by Mark Giles that patient has relatively preserved FEV1. He does not use supplemental oxygen. Chest x-ray showed COPD/emphysema. He has occasional mild chest discomfort at random times including at rest and at varying locations of the chest that last for 5 to 10 seconds, nonradiating, and not associated with any shortness of breath, palpitations, or lightheadedness. He denies orthopnea, PND and lower extremity edema.  He smoked about 2+ packs per day of cigarettes from age 48 until 4 years ago. He continues to use an E cigarette with nicotine which he has been weaning down. He drinks a rare glass of wine or beer. He is vague on his family history but he does note that his father had a heart attack at age 30. He was also a heavy smoker and he ultimately lived to age 89.  PAD Screen 12/15/2016  Previous PAD dx? No  Previous surgical procedure? No  Pain with walking? Yes  Subsides with rest? Yes  Feet/toe relief with dangling? No  Painful, non-healing ulcers? No  Extremities discolored? No     Past Medical History:  Diagnosis Date  . Anxiety   . Chronic back pain    History of   . COPD (chronic obstructive pulmonary disease) ([place])   . Dyslipidemia   . H/O: depression   . Hyperlipidemia   . Inguinal hernia without mention of obstruction or gangrene, unilateral or unspecified, (not specified as recurrent) 2013  . Tobacco abuse  Past Surgical History:  Procedure Laterality Date  . COLONOSCOPY WITH PROPOFOL N/A 10/22/2012   Procedure: COLONOSCOPY WITH PROPOFOL;  Surgeon: Mark Fair, MD;  Location: [facility];  Service: Endoscopy;  Laterality: N/A;  . HERNIA REPAIR  Jul 06, 2012  . LAPAROTOMY     Trauma  . LUMBAR FUSION  2007   L4-L5    Current Medications: Current Meds  Medication Sig  . ALPRAZolam (XANAX) 1 MG tablet Take 0.5 mg by mouth 2 (two) times daily.   Marland Kitchen escitalopram (LEXAPRO) 10 MG tablet Take 10 mg by mouth daily.  .  Fluticasone-Salmeterol (AIRDUO RESPICLICK 119/14) 782-95 MCG/ACT AEPB Inhale 1 puff into the lungs 2 (two) times daily.  Marland Kitchen levothyroxine (SYNTHROID, LEVOTHROID) 100 MCG tablet Take 100 mcg by mouth daily before breakfast.  . lovastatin (MEVACOR) 20 MG tablet Take 20 mg by mouth at bedtime.   Marland Kitchen morphine (MS CONTIN) 30 MG 12 hr tablet Take 30 mg by mouth 2 (two) times daily.      Allergies:   Patient has no known allergies.   Social History   Social History  . Marital status: Married    Spouse name: N/A  . Number of children: N/A  . Years of education: N/A   Occupational History  . retired    Social History Main Topics  . Smoking status: Current Every Day Smoker    Packs/day: 3.00    Years: 30.00    Types: Cigarettes, E-cigarettes    Last attempt to quit: 01/31/2014  . Smokeless tobacco: Never Used     Comment: uses e-cig  . Alcohol use 0.0 oz/week     Comment: occasional/social   . Drug use: No  . Sexual activity: Not Asked   Other Topics Concern  . None   Social History Narrative  . None     Family History: The patient's family history includes COPD in his brother; Heart attack in his father; Heart disease in his paternal grandfather; Migraines in his brother; Obesity in his mother; Other in his sister. ROS:   Please see the history of present illness.     All other systems reviewed and are negative.  EKGs/Labs/Other Studies Reviewed:    The following studies were reviewed today:  CXR:   08/29/16 1. No acute cardiopulmonary abnormalities. 2. COPD/emphysema.   EKG:  EKG is  ordered today.  The ekg ordered today demonstrates normal sinus rhythm with no ischemic findings.QTC 432. No previous EKG for comparison.   Recent Labs: No results found for requested labs within last 8760 hours.   Recent Lipid Panel No results found for: CHOL, TRIG, HDL, CHOLHDL, VLDL, LDLCALC, LDLDIRECT  Physical Exam:    VS:  BP 102/70   Pulse 76   Ht 5\' 10"  (1.778 m)   Wt 180 lb  (81.6 kg)   SpO2 95%   BMI 25.83 kg/m     Wt Readings from Last 3 Encounters:  12/15/16 180 lb (81.6 kg)  10/10/16 181 lb 12.8 oz (82.5 kg)  03/17/16 167 lb (75.8 kg)     GEN:  Well nourished, well developed in no acute distress HEENT: Normal NECK: No JVD; No carotid bruits LYMPHATICS: No lymphadenopathy CARDIAC: RRR, no murmurs, rubs, gallops, very faint pedal pulses RESPIRATORY:  Clear to auscultation without rales, wheezing or rhonchi  ABDOMEN: Soft, non-tender, non-distended MUSCULOSKELETAL: Trace pretibial edema; No deformity  SKIN: Warm and dry NEUROLOGIC:  Alert and oriented x 3 PSYCHIATRIC:  Normal affect   ASSESSMENT:  1. Exertional dyspnea   2. Pain in both lower extremities   3. SOB (shortness of breath)   4. Pulmonary emphysema, unspecified emphysema type ([place])   5. Other tobacco product nicotine dependence with nicotine-induced disorder   6. Hyperlipidemia, unspecified hyperlipidemia type    PLAN:    In order of problems listed above:  1. Fatigue and mild dyspnea on exertion -The patient's main concern is progressive fatigue and activity intolerance over the last 2 years. No significant chest pain. -He was a 2 pack per day smoker for 45 years having quit 4 years ago but continues to use a nicotine vaping E cigarette -The patient has dyspnea on exertion that has been progressive over the last 2 years -CVD risk factors include a long time tobacco use, hyperlipidemia and family history with father having an MI at age 67 (was also a heavy smoker) -Will check Lexiscan Myoview to assess for myocardial ischemia and echocardiogram to assess heart function and pressures  2. Claudication -Patient has activity limiting leg pain with walking that resolves with rest. He also has very diminished pedal pulses and lack of hair in the lower leg and feet -Will check ABIs and lower extremity arterial Dopplers  3. COPD -Long time smoking history with level of COPD not  severe enough to account for the patient's recently progressive dyspnea on exertion. Patient reports is much better controlled on long-acting inhaler  4. Nicotine dependence -Patient was a heavy smoker until 4 years ago and is now using a cigarettes with nicotine. He states that he has decreased the nicotine content from 21 mg to 6 mg. -Patient counseled on complete nicotine cessation. He states that he will work on stopping the vaping  4. Hyperlipidemia -Followed at PCP and on lovastatin 20 mg daily. Lipid panel on 08/30/16 shows total cholesterol 159, LDL 88, HDL 51, triglycerides 99. -Continue current statin therapy pending further cardiac workup  Post-ablation hypothyroidism with thyroid supplementational per internal medicine with recent normal labs  Assessment and plan has been discussed with Mark. Tamala Giles who personally assess the patient as well.  I have spent 40 minutes time with the patient and assessment, evaluation and Plan.   Medication Adjustments/Labs and Tests Ordered: Current medicines are reviewed at length with the patient today.  Concerns regarding medicines are outlined above. Labs and tests ordered and medication changes are outlined in the patient instructions below:  Patient Instructions                                                                                                                                        Medication Instructions:   Your physician recommends that you continue on your current medications as directed. Please refer to the Current Medication list given to you today.    If you need a refill on your cardiac medications before your next appointment, please call your pharmacy.  Labwork: NONE ORDERED  TODAY    Testing/Procedures: Your physician has requested that you have an echocardiogram. Echocardiography is a painless test that uses sound waves to create images of your heart. It provides your doctor with information about the size and shape  of your heart and how well your heart's chambers and valves are working. This procedure takes approximately one hour. There are no restrictions for this procedure.  Your physician has requested that you have a lexiscan myoview. For further information please visit [email or web address]. Please follow instruction sheet, as given.   Your physician has requested that you have a lower extremity arterial exercise duplex. During this test, exercise and ultrasound are used to evaluate arterial blood flow in the legs. Allow one hour for this exam. There are no restrictions or special instructions.   Your physician has requested that you have an ankle brachial index (ABI). During this test an ultrasound and blood pressure cuff are used to evaluate the arteries that supply the arms and legs with blood. Allow thirty minutes for this exam. There are no restrictions or special instructions.    Follow-Up:  AFTER TESTING WITH Mark Mark Giles    Any Other Special Instructions Will Be Listed Below (If Applicable)     Signed, Mark Perch, NP  12/15/2016 1:09 PM    [facility]

## 2016-12-26 ENCOUNTER — Telehealth (HOSPITAL_COMMUNITY): Payer: Self-pay | Admitting: *Deleted

## 2016-12-26 NOTE — Telephone Encounter (Signed)
Patient given detailed instructions per Myocardial Perfusion Study Information Sheet for the test on 12/28/16 at 1000. Patient notified to arrive 15 minutes early and that it is imperative to arrive on time for appointment to keep from having the test rescheduled.  If you need to cancel or reschedule your appointment, please call the office within 24 hours of your appointment. . Patient verbalized understanding.Laren Orama, [place]

## 2016-12-28 ENCOUNTER — Ambulatory Visit (HOSPITAL_COMMUNITY): Payer: Medicare Other | Attending: Cardiology

## 2016-12-28 ENCOUNTER — Ambulatory Visit (HOSPITAL_BASED_OUTPATIENT_CLINIC_OR_DEPARTMENT_OTHER): Payer: Medicare Other

## 2016-12-28 ENCOUNTER — Telehealth: Payer: Self-pay | Admitting: *Deleted

## 2016-12-28 DIAGNOSIS — R0602 Shortness of breath: Secondary | ICD-10-CM

## 2016-12-28 DIAGNOSIS — R0600 Dyspnea, unspecified: Secondary | ICD-10-CM

## 2016-12-28 DIAGNOSIS — R0609 Other forms of dyspnea: Secondary | ICD-10-CM

## 2016-12-28 LAB — MYOCARDIAL PERFUSION IMAGING
CSEPPHR: 85 {beats}/min
LHR: 0.28
LVDIAVOL: 88 mL (ref 62–150)
LVSYSVOL: 43 mL
Rest HR: 52 {beats}/min
SDS: 0
SRS: 5
SSS: 5
TID: 1.08

## 2016-12-28 MED ORDER — TECHNETIUM TC 99M TETROFOSMIN IV KIT
32.9000 | PACK | Freq: Once | INTRAVENOUS | Status: AC | PRN
  Administered 2016-12-28: 32.9 via INTRAVENOUS
  Filled 2016-12-28: qty 33

## 2016-12-28 MED ORDER — TECHNETIUM TC 99M TETROFOSMIN IV KIT
10.9000 | PACK | Freq: Once | INTRAVENOUS | Status: AC | PRN
  Administered 2016-12-28: 10.9 via INTRAVENOUS
  Filled 2016-12-28: qty 11

## 2016-12-28 MED ORDER — REGADENOSON 0.4 MG/5ML IV SOLN
0.4000 mg | Freq: Once | INTRAVENOUS | Status: AC
  Administered 2016-12-28: 0.4 mg via INTRAVENOUS

## 2016-12-28 NOTE — Telephone Encounter (Signed)
-----   Message from Daune Perch, NP sent at 12/28/2016  4:45 PM EDT ----- Please let pt know that his echocardiogram was normal with normal pumping function and normal heart valves. No indications of the heart causing his shortness of breath. He still has stress test results pending.    Pecolia Ades, NP

## 2016-12-28 NOTE — Telephone Encounter (Signed)
Pt has been notified of echo results by phone with verbal understanding. Pt aware Myoview results are not in yet, though once read we will call with those results as well. Pt thanked me for my call and the good news today.

## 2017-01-01 ENCOUNTER — Ambulatory Visit (HOSPITAL_COMMUNITY)
Admission: RE | Admit: 2017-01-01 | Discharge: 2017-01-01 | Disposition: A | Discharge status: Home / Self Care | Payer: Medicare Other | Source: Ambulatory Visit | Attending: Cardiology | Admitting: Cardiology

## 2017-01-01 DIAGNOSIS — M79605 Pain in left leg: Secondary | ICD-10-CM | POA: Diagnosis not present

## 2017-01-01 DIAGNOSIS — M79604 Pain in right leg: Secondary | ICD-10-CM | POA: Insufficient documentation

## 2017-01-01 DIAGNOSIS — I739 Peripheral vascular disease, unspecified: Secondary | ICD-10-CM | POA: Diagnosis not present

## 2017-01-02 ENCOUNTER — Telehealth: Payer: Self-pay | Admitting: Interventional Cardiology

## 2017-01-02 NOTE — Telephone Encounter (Signed)
Mr.Friesen is returning your call . Thanks

## 2017-01-02 NOTE — Telephone Encounter (Signed)
Spoke with pt and went over recommendation for 6 wk f/u.  Pt would like to "pass on this" for now.  Pt states he will call if he feels like he needs to come in and be seen but right now he is just tired of a lot of Dr appts.  Advised pt again of why we wanted him to come in and that we just want to make sure he is taken care of.  Pt verbalized understanding and was very appreciative about concern but still decided not to make an appt. Advised pt of importance of calling if sx persist or worsen.  Pt verbalized understanding and was in agreement with this plan.  Pt very pleasant.  Will route to Dr. Tamala Julian to make him aware.

## 2017-02-27 DIAGNOSIS — J449 Chronic obstructive pulmonary disease, unspecified: Secondary | ICD-10-CM | POA: Diagnosis not present

## 2017-02-27 DIAGNOSIS — E890 Postprocedural hypothyroidism: Secondary | ICD-10-CM | POA: Diagnosis not present

## 2017-02-27 DIAGNOSIS — G894 Chronic pain syndrome: Secondary | ICD-10-CM | POA: Diagnosis not present

## 2017-02-27 DIAGNOSIS — D696 Thrombocytopenia, unspecified: Secondary | ICD-10-CM | POA: Diagnosis not present

## 2017-02-27 DIAGNOSIS — F324 Major depressive disorder, single episode, in partial remission: Secondary | ICD-10-CM | POA: Diagnosis not present

## 2017-03-25 ENCOUNTER — Other Ambulatory Visit: Payer: Self-pay | Admitting: Internal Medicine

## 2017-04-04 ENCOUNTER — Other Ambulatory Visit: Payer: Self-pay | Admitting: Internal Medicine

## 2017-05-22 DIAGNOSIS — J449 Chronic obstructive pulmonary disease, unspecified: Secondary | ICD-10-CM | POA: Diagnosis not present

## 2017-05-22 DIAGNOSIS — Z23 Encounter for immunization: Secondary | ICD-10-CM | POA: Diagnosis not present

## 2017-05-22 DIAGNOSIS — L299 Pruritus, unspecified: Secondary | ICD-10-CM | POA: Diagnosis not present

## 2017-05-22 DIAGNOSIS — F324 Major depressive disorder, single episode, in partial remission: Secondary | ICD-10-CM | POA: Diagnosis not present

## 2017-08-06 DIAGNOSIS — H903 Sensorineural hearing loss, bilateral: Secondary | ICD-10-CM | POA: Insufficient documentation

## 2017-08-06 DIAGNOSIS — H6121 Impacted cerumen, right ear: Secondary | ICD-10-CM | POA: Diagnosis not present

## 2017-10-02 DIAGNOSIS — L299 Pruritus, unspecified: Secondary | ICD-10-CM | POA: Diagnosis not present

## 2017-10-02 DIAGNOSIS — D696 Thrombocytopenia, unspecified: Secondary | ICD-10-CM | POA: Diagnosis not present

## 2017-10-02 DIAGNOSIS — Z0000 Encounter for general adult medical examination without abnormal findings: Secondary | ICD-10-CM | POA: Diagnosis not present

## 2017-10-02 DIAGNOSIS — Z1389 Encounter for screening for other disorder: Secondary | ICD-10-CM | POA: Diagnosis not present

## 2017-10-02 DIAGNOSIS — G894 Chronic pain syndrome: Secondary | ICD-10-CM | POA: Diagnosis not present

## 2017-10-02 DIAGNOSIS — E890 Postprocedural hypothyroidism: Secondary | ICD-10-CM | POA: Diagnosis not present

## 2017-10-02 DIAGNOSIS — F324 Major depressive disorder, single episode, in partial remission: Secondary | ICD-10-CM | POA: Diagnosis not present

## 2017-10-02 DIAGNOSIS — J449 Chronic obstructive pulmonary disease, unspecified: Secondary | ICD-10-CM | POA: Diagnosis not present

## 2017-10-02 DIAGNOSIS — E782 Mixed hyperlipidemia: Secondary | ICD-10-CM | POA: Diagnosis not present

## 2017-10-02 DIAGNOSIS — E0500 Thyrotoxicosis with diffuse goiter without thyrotoxic crisis or storm: Secondary | ICD-10-CM | POA: Diagnosis not present

## 2017-10-02 DIAGNOSIS — Z125 Encounter for screening for malignant neoplasm of prostate: Secondary | ICD-10-CM | POA: Diagnosis not present

## 2017-12-10 DIAGNOSIS — R5383 Other fatigue: Secondary | ICD-10-CM | POA: Diagnosis not present

## 2017-12-10 DIAGNOSIS — E291 Testicular hypofunction: Secondary | ICD-10-CM | POA: Diagnosis not present

## 2017-12-10 DIAGNOSIS — E0500 Thyrotoxicosis with diffuse goiter without thyrotoxic crisis or storm: Secondary | ICD-10-CM | POA: Diagnosis not present

## 2017-12-11 DIAGNOSIS — E291 Testicular hypofunction: Secondary | ICD-10-CM | POA: Diagnosis not present

## 2017-12-11 DIAGNOSIS — R5383 Other fatigue: Secondary | ICD-10-CM | POA: Diagnosis not present

## 2017-12-19 DIAGNOSIS — R5383 Other fatigue: Secondary | ICD-10-CM | POA: Diagnosis not present

## 2017-12-19 DIAGNOSIS — R7989 Other specified abnormal findings of blood chemistry: Secondary | ICD-10-CM | POA: Diagnosis not present

## 2018-01-21 DIAGNOSIS — E890 Postprocedural hypothyroidism: Secondary | ICD-10-CM | POA: Diagnosis not present

## 2018-01-22 DIAGNOSIS — R21 Rash and other nonspecific skin eruption: Secondary | ICD-10-CM | POA: Diagnosis not present

## 2018-01-22 DIAGNOSIS — L280 Lichen simplex chronicus: Secondary | ICD-10-CM | POA: Diagnosis not present

## 2018-01-22 DIAGNOSIS — L308 Other specified dermatitis: Secondary | ICD-10-CM | POA: Diagnosis not present

## 2018-01-22 DIAGNOSIS — D485 Neoplasm of uncertain behavior of skin: Secondary | ICD-10-CM | POA: Diagnosis not present

## 2018-02-13 DIAGNOSIS — R300 Dysuria: Secondary | ICD-10-CM | POA: Diagnosis not present

## 2018-03-11 DIAGNOSIS — M545 Low back pain: Secondary | ICD-10-CM | POA: Diagnosis not present

## 2018-03-11 DIAGNOSIS — N50811 Right testicular pain: Secondary | ICD-10-CM | POA: Diagnosis not present

## 2018-03-14 DIAGNOSIS — E890 Postprocedural hypothyroidism: Secondary | ICD-10-CM | POA: Diagnosis not present

## 2018-04-04 DIAGNOSIS — Z23 Encounter for immunization: Secondary | ICD-10-CM | POA: Diagnosis not present

## 2018-04-04 DIAGNOSIS — M519 Unspecified thoracic, thoracolumbar and lumbosacral intervertebral disc disorder: Secondary | ICD-10-CM | POA: Diagnosis not present

## 2018-04-04 DIAGNOSIS — D696 Thrombocytopenia, unspecified: Secondary | ICD-10-CM | POA: Diagnosis not present

## 2018-04-04 DIAGNOSIS — J449 Chronic obstructive pulmonary disease, unspecified: Secondary | ICD-10-CM | POA: Diagnosis not present

## 2018-04-04 DIAGNOSIS — F324 Major depressive disorder, single episode, in partial remission: Secondary | ICD-10-CM | POA: Diagnosis not present

## 2018-04-04 DIAGNOSIS — G894 Chronic pain syndrome: Secondary | ICD-10-CM | POA: Diagnosis not present

## 2018-04-04 DIAGNOSIS — E782 Mixed hyperlipidemia: Secondary | ICD-10-CM | POA: Diagnosis not present

## 2018-04-04 DIAGNOSIS — E0500 Thyrotoxicosis with diffuse goiter without thyrotoxic crisis or storm: Secondary | ICD-10-CM | POA: Diagnosis not present

## 2018-09-10 DIAGNOSIS — H2513 Age-related nuclear cataract, bilateral: Secondary | ICD-10-CM | POA: Diagnosis not present

## 2018-09-10 DIAGNOSIS — H5203 Hypermetropia, bilateral: Secondary | ICD-10-CM | POA: Diagnosis not present

## 2018-10-28 DIAGNOSIS — G894 Chronic pain syndrome: Secondary | ICD-10-CM | POA: Diagnosis not present

## 2018-10-28 DIAGNOSIS — E890 Postprocedural hypothyroidism: Secondary | ICD-10-CM | POA: Diagnosis not present

## 2018-10-28 DIAGNOSIS — J449 Chronic obstructive pulmonary disease, unspecified: Secondary | ICD-10-CM | POA: Diagnosis not present

## 2018-10-28 DIAGNOSIS — F324 Major depressive disorder, single episode, in partial remission: Secondary | ICD-10-CM | POA: Diagnosis not present

## 2018-12-11 DIAGNOSIS — D696 Thrombocytopenia, unspecified: Secondary | ICD-10-CM | POA: Diagnosis not present

## 2018-12-11 DIAGNOSIS — Z0000 Encounter for general adult medical examination without abnormal findings: Secondary | ICD-10-CM | POA: Diagnosis not present

## 2018-12-11 DIAGNOSIS — J449 Chronic obstructive pulmonary disease, unspecified: Secondary | ICD-10-CM | POA: Diagnosis not present

## 2018-12-11 DIAGNOSIS — Z1389 Encounter for screening for other disorder: Secondary | ICD-10-CM | POA: Diagnosis not present

## 2018-12-11 DIAGNOSIS — E890 Postprocedural hypothyroidism: Secondary | ICD-10-CM | POA: Diagnosis not present

## 2018-12-11 DIAGNOSIS — N400 Enlarged prostate without lower urinary tract symptoms: Secondary | ICD-10-CM | POA: Diagnosis not present

## 2018-12-11 DIAGNOSIS — G8929 Other chronic pain: Secondary | ICD-10-CM | POA: Diagnosis not present

## 2018-12-11 DIAGNOSIS — E0500 Thyrotoxicosis with diffuse goiter without thyrotoxic crisis or storm: Secondary | ICD-10-CM | POA: Diagnosis not present

## 2018-12-11 DIAGNOSIS — E782 Mixed hyperlipidemia: Secondary | ICD-10-CM | POA: Diagnosis not present

## 2018-12-11 DIAGNOSIS — Z1159 Encounter for screening for other viral diseases: Secondary | ICD-10-CM | POA: Diagnosis not present

## 2018-12-17 DIAGNOSIS — M519 Unspecified thoracic, thoracolumbar and lumbosacral intervertebral disc disorder: Secondary | ICD-10-CM | POA: Diagnosis not present

## 2018-12-17 DIAGNOSIS — E890 Postprocedural hypothyroidism: Secondary | ICD-10-CM | POA: Diagnosis not present

## 2018-12-17 DIAGNOSIS — G8929 Other chronic pain: Secondary | ICD-10-CM | POA: Diagnosis not present

## 2018-12-17 DIAGNOSIS — D696 Thrombocytopenia, unspecified: Secondary | ICD-10-CM | POA: Diagnosis not present

## 2018-12-17 DIAGNOSIS — N400 Enlarged prostate without lower urinary tract symptoms: Secondary | ICD-10-CM | POA: Diagnosis not present

## 2018-12-17 DIAGNOSIS — E0500 Thyrotoxicosis with diffuse goiter without thyrotoxic crisis or storm: Secondary | ICD-10-CM | POA: Diagnosis not present

## 2018-12-17 DIAGNOSIS — F324 Major depressive disorder, single episode, in partial remission: Secondary | ICD-10-CM | POA: Diagnosis not present

## 2018-12-17 DIAGNOSIS — J449 Chronic obstructive pulmonary disease, unspecified: Secondary | ICD-10-CM | POA: Diagnosis not present

## 2018-12-17 DIAGNOSIS — E782 Mixed hyperlipidemia: Secondary | ICD-10-CM | POA: Diagnosis not present

## 2019-02-03 DIAGNOSIS — E890 Postprocedural hypothyroidism: Secondary | ICD-10-CM | POA: Diagnosis not present

## 2019-02-03 DIAGNOSIS — E0500 Thyrotoxicosis with diffuse goiter without thyrotoxic crisis or storm: Secondary | ICD-10-CM | POA: Diagnosis not present

## 2019-03-31 DIAGNOSIS — Z23 Encounter for immunization: Secondary | ICD-10-CM | POA: Diagnosis not present

## 2019-04-07 DIAGNOSIS — E890 Postprocedural hypothyroidism: Secondary | ICD-10-CM | POA: Diagnosis not present

## 2019-04-14 DIAGNOSIS — H2513 Age-related nuclear cataract, bilateral: Secondary | ICD-10-CM | POA: Diagnosis not present

## 2019-05-20 DIAGNOSIS — J449 Chronic obstructive pulmonary disease, unspecified: Secondary | ICD-10-CM | POA: Diagnosis not present

## 2019-05-20 DIAGNOSIS — G894 Chronic pain syndrome: Secondary | ICD-10-CM | POA: Diagnosis not present

## 2019-05-20 DIAGNOSIS — F324 Major depressive disorder, single episode, in partial remission: Secondary | ICD-10-CM | POA: Diagnosis not present

## 2019-05-20 DIAGNOSIS — E890 Postprocedural hypothyroidism: Secondary | ICD-10-CM | POA: Diagnosis not present

## 2019-05-20 DIAGNOSIS — D696 Thrombocytopenia, unspecified: Secondary | ICD-10-CM | POA: Diagnosis not present

## 2019-07-25 ENCOUNTER — Ambulatory Visit: Payer: Medicare Other | Attending: Internal Medicine

## 2019-07-25 DIAGNOSIS — Z23 Encounter for immunization: Secondary | ICD-10-CM | POA: Insufficient documentation

## 2019-07-25 NOTE — Progress Notes (Signed)
   Covid-19 Vaccination Clinic  Name:  Mark Giles    MRN: [id number] DOB: [birth year]  07/25/2019  [name] was observed post Covid-19 immunization for 15 minutes without incidence. He was provided with Vaccine Information Sheet and instruction to access the V-Safe system.   [name] was instructed to call 911 with any severe reactions post vaccine: Marland Kitchen Difficulty breathing  . Swelling of your face and throat  . A fast heartbeat  . A bad rash all over your body  . Dizziness and weakness    Immunizations Administered    Name Date Dose VIS Date Route   Pfizer COVID-19 Vaccine 07/25/2019 12:41 PM 0.3 mL 06/13/2019 Intramuscular   Manufacturer: [place]   Lot: [id number]   [place]: [id number]

## 2019-08-12 DIAGNOSIS — G8929 Other chronic pain: Secondary | ICD-10-CM | POA: Diagnosis not present

## 2019-08-12 DIAGNOSIS — E0500 Thyrotoxicosis with diffuse goiter without thyrotoxic crisis or storm: Secondary | ICD-10-CM | POA: Diagnosis not present

## 2019-08-12 DIAGNOSIS — E782 Mixed hyperlipidemia: Secondary | ICD-10-CM | POA: Diagnosis not present

## 2019-08-12 DIAGNOSIS — F322 Major depressive disorder, single episode, severe without psychotic features: Secondary | ICD-10-CM | POA: Diagnosis not present

## 2019-08-12 DIAGNOSIS — Z7189 Other specified counseling: Secondary | ICD-10-CM | POA: Diagnosis not present

## 2019-08-12 DIAGNOSIS — E890 Postprocedural hypothyroidism: Secondary | ICD-10-CM | POA: Diagnosis not present

## 2019-08-12 DIAGNOSIS — J449 Chronic obstructive pulmonary disease, unspecified: Secondary | ICD-10-CM | POA: Diagnosis not present

## 2019-08-12 DIAGNOSIS — D696 Thrombocytopenia, unspecified: Secondary | ICD-10-CM | POA: Diagnosis not present

## 2019-08-12 DIAGNOSIS — G894 Chronic pain syndrome: Secondary | ICD-10-CM | POA: Diagnosis not present

## 2019-08-12 DIAGNOSIS — F419 Anxiety disorder, unspecified: Secondary | ICD-10-CM | POA: Diagnosis not present

## 2019-08-15 ENCOUNTER — Ambulatory Visit: Payer: Medicare Other | Attending: Internal Medicine

## 2019-08-15 DIAGNOSIS — Z23 Encounter for immunization: Secondary | ICD-10-CM | POA: Insufficient documentation

## 2019-08-15 NOTE — Progress Notes (Signed)
   Covid-19 Vaccination Clinic  Name:  Mark Giles    MRN: [id number] DOB: [birth year]  08/15/2019  Mr. Mark Giles was observed post Covid-19 immunization for 15 minutes without incidence. He was provided with Vaccine Information Sheet and instruction to access the V-Safe system.   Mr. Mark Giles was instructed to call 911 with any severe reactions post vaccine: Marland Kitchen Difficulty breathing  . Swelling of your face and throat  . A fast heartbeat  . A bad rash all over your body  . Dizziness and weakness    Immunizations Administered    Name Date Dose VIS Date Route   Pfizer COVID-19 Vaccine 08/15/2019 12:03 PM 0.3 mL 06/13/2019 Intramuscular   Manufacturer: [place]   Lot: [id number]   [place]: [id number]

## 2019-09-16 DIAGNOSIS — R3989 Other symptoms and signs involving the genitourinary system: Secondary | ICD-10-CM | POA: Diagnosis not present

## 2019-09-16 DIAGNOSIS — R0602 Shortness of breath: Secondary | ICD-10-CM | POA: Diagnosis not present

## 2019-09-16 DIAGNOSIS — F419 Anxiety disorder, unspecified: Secondary | ICD-10-CM | POA: Diagnosis not present

## 2019-09-16 DIAGNOSIS — R634 Abnormal weight loss: Secondary | ICD-10-CM | POA: Diagnosis not present

## 2019-09-16 DIAGNOSIS — R1013 Epigastric pain: Secondary | ICD-10-CM | POA: Diagnosis not present

## 2019-09-16 DIAGNOSIS — D699 Hemorrhagic condition, unspecified: Secondary | ICD-10-CM | POA: Diagnosis not present

## 2019-09-16 DIAGNOSIS — M519 Unspecified thoracic, thoracolumbar and lumbosacral intervertebral disc disorder: Secondary | ICD-10-CM | POA: Diagnosis not present

## 2019-09-16 DIAGNOSIS — N400 Enlarged prostate without lower urinary tract symptoms: Secondary | ICD-10-CM | POA: Diagnosis not present

## 2019-09-16 DIAGNOSIS — M2550 Pain in unspecified joint: Secondary | ICD-10-CM | POA: Diagnosis not present

## 2019-09-24 DIAGNOSIS — M25511 Pain in right shoulder: Secondary | ICD-10-CM | POA: Diagnosis not present

## 2019-09-24 DIAGNOSIS — M47812 Spondylosis without myelopathy or radiculopathy, cervical region: Secondary | ICD-10-CM | POA: Diagnosis not present

## 2019-09-24 DIAGNOSIS — M25512 Pain in left shoulder: Secondary | ICD-10-CM | POA: Diagnosis not present

## 2019-10-08 DIAGNOSIS — J449 Chronic obstructive pulmonary disease, unspecified: Secondary | ICD-10-CM | POA: Diagnosis not present

## 2019-10-08 DIAGNOSIS — F419 Anxiety disorder, unspecified: Secondary | ICD-10-CM | POA: Diagnosis not present

## 2019-10-08 DIAGNOSIS — G4700 Insomnia, unspecified: Secondary | ICD-10-CM | POA: Diagnosis not present

## 2019-11-01 ENCOUNTER — Other Ambulatory Visit: Payer: Self-pay

## 2019-11-01 ENCOUNTER — Encounter (HOSPITAL_COMMUNITY): Payer: Self-pay | Admitting: Emergency Medicine

## 2019-11-01 ENCOUNTER — Inpatient Hospital Stay (HOSPITAL_COMMUNITY)
Admission: EM | Admit: 2019-11-01 | Discharge: 2019-11-03 | DRG: 394 | Disposition: A | Discharge status: Home / Self Care | Payer: Medicare Other | Attending: Internal Medicine | Admitting: Internal Medicine

## 2019-11-01 ENCOUNTER — Emergency Department (HOSPITAL_COMMUNITY): Payer: Medicare Other

## 2019-11-01 DIAGNOSIS — Z79890 Hormone replacement therapy (postmenopausal): Secondary | ICD-10-CM

## 2019-11-01 DIAGNOSIS — J449 Chronic obstructive pulmonary disease, unspecified: Secondary | ICD-10-CM | POA: Diagnosis not present

## 2019-11-01 DIAGNOSIS — Z8249 Family history of ischemic heart disease and other diseases of the circulatory system: Secondary | ICD-10-CM

## 2019-11-01 DIAGNOSIS — R935 Abnormal findings on diagnostic imaging of other abdominal regions, including retroperitoneum: Secondary | ICD-10-CM | POA: Diagnosis not present

## 2019-11-01 DIAGNOSIS — D490 Neoplasm of unspecified behavior of digestive system: Secondary | ICD-10-CM | POA: Diagnosis not present

## 2019-11-01 DIAGNOSIS — R1031 Right lower quadrant pain: Secondary | ICD-10-CM | POA: Diagnosis not present

## 2019-11-01 DIAGNOSIS — N400 Enlarged prostate without lower urinary tract symptoms: Secondary | ICD-10-CM | POA: Diagnosis not present

## 2019-11-01 DIAGNOSIS — E039 Hypothyroidism, unspecified: Secondary | ICD-10-CM | POA: Diagnosis present

## 2019-11-01 DIAGNOSIS — Z720 Tobacco use: Secondary | ICD-10-CM | POA: Diagnosis present

## 2019-11-01 DIAGNOSIS — K5732 Diverticulitis of large intestine without perforation or abscess without bleeding: Secondary | ICD-10-CM | POA: Diagnosis not present

## 2019-11-01 DIAGNOSIS — G8929 Other chronic pain: Secondary | ICD-10-CM | POA: Insufficient documentation

## 2019-11-01 DIAGNOSIS — Z03818 Encounter for observation for suspected exposure to other biological agents ruled out: Secondary | ICD-10-CM | POA: Diagnosis not present

## 2019-11-01 DIAGNOSIS — F411 Generalized anxiety disorder: Secondary | ICD-10-CM | POA: Diagnosis not present

## 2019-11-01 DIAGNOSIS — K56609 Unspecified intestinal obstruction, unspecified as to partial versus complete obstruction: Secondary | ICD-10-CM | POA: Diagnosis present

## 2019-11-01 DIAGNOSIS — E785 Hyperlipidemia, unspecified: Secondary | ICD-10-CM | POA: Diagnosis present

## 2019-11-01 DIAGNOSIS — Z825 Family history of asthma and other chronic lower respiratory diseases: Secondary | ICD-10-CM

## 2019-11-01 DIAGNOSIS — Z20822 Contact with and (suspected) exposure to covid-19: Secondary | ICD-10-CM | POA: Diagnosis present

## 2019-11-01 DIAGNOSIS — Z87891 Personal history of nicotine dependence: Secondary | ICD-10-CM

## 2019-11-01 DIAGNOSIS — Z79899 Other long term (current) drug therapy: Secondary | ICD-10-CM

## 2019-11-01 DIAGNOSIS — F1210 Cannabis abuse, uncomplicated: Secondary | ICD-10-CM | POA: Diagnosis present

## 2019-11-01 DIAGNOSIS — K6389 Other specified diseases of intestine: Secondary | ICD-10-CM | POA: Diagnosis not present

## 2019-11-01 DIAGNOSIS — Z915 Personal history of self-harm: Secondary | ICD-10-CM

## 2019-11-01 DIAGNOSIS — Z8659 Personal history of other mental and behavioral disorders: Secondary | ICD-10-CM | POA: Insufficient documentation

## 2019-11-01 DIAGNOSIS — IMO0002 Reserved for concepts with insufficient information to code with codable children: Secondary | ICD-10-CM

## 2019-11-01 DIAGNOSIS — R109 Unspecified abdominal pain: Secondary | ICD-10-CM | POA: Diagnosis not present

## 2019-11-01 DIAGNOSIS — Z981 Arthrodesis status: Secondary | ICD-10-CM

## 2019-11-01 DIAGNOSIS — M545 Low back pain: Secondary | ICD-10-CM | POA: Diagnosis present

## 2019-11-01 LAB — CBC WITH DIFFERENTIAL/PLATELET
Abs Immature Granulocytes: 0.07 10*3/uL (ref 0.00–0.07)
Basophils Absolute: 0.1 10*3/uL (ref 0.0–0.1)
Basophils Relative: 1 %
Eosinophils Absolute: 0.6 10*3/uL — ABNORMAL HIGH (ref 0.0–0.5)
Eosinophils Relative: 7 %
HCT: 45.8 % (ref 39.0–52.0)
Hemoglobin: 15.4 g/dL (ref 13.0–17.0)
Immature Granulocytes: 1 %
Lymphocytes Relative: 17 %
Lymphs Abs: 1.5 10*3/uL (ref 0.7–4.0)
MCH: 32.3 pg (ref 26.0–34.0)
MCHC: 33.6 g/dL (ref 30.0–36.0)
MCV: 96 fL (ref 80.0–100.0)
Monocytes Absolute: 1 10*3/uL (ref 0.1–1.0)
Monocytes Relative: 11 %
Neutro Abs: 5.3 10*3/uL (ref 1.7–7.7)
Neutrophils Relative %: 63 %
Platelets: 156 10*3/uL (ref 150–400)
RBC: 4.77 MIL/uL (ref 4.22–5.81)
RDW: 13.2 % (ref 11.5–15.5)
WBC: 8.4 10*3/uL (ref 4.0–10.5)
nRBC: 0 % (ref 0.0–0.2)

## 2019-11-01 LAB — LIPASE, BLOOD: Lipase: 45 U/L (ref 11–51)

## 2019-11-01 LAB — COMPREHENSIVE METABOLIC PANEL
ALT: 22 U/L (ref 0–44)
AST: 24 U/L (ref 15–41)
Albumin: 3.9 g/dL (ref 3.5–5.0)
Alkaline Phosphatase: 45 U/L (ref 38–126)
Anion gap: 6 (ref 5–15)
BUN: 8 mg/dL (ref 8–23)
CO2: 28 mmol/L (ref 22–32)
Calcium: 9.3 mg/dL (ref 8.9–10.3)
Chloride: 105 mmol/L (ref 98–111)
Creatinine, Ser: 0.77 mg/dL (ref 0.61–1.24)
GFR calc Af Amer: >60 mL/min (ref >60)
GFR calc non Af Amer: >60 mL/min (ref >60)
Glucose, Bld: 105 mg/dL — ABNORMAL HIGH (ref 70–99)
Potassium: 4.1 mmol/L (ref 3.5–5.1)
Sodium: 139 mmol/L (ref 135–145)
Total Bilirubin: 1 mg/dL (ref 0.3–1.2)
Total Protein: 6.5 g/dL (ref 6.5–8.1)

## 2019-11-01 LAB — URINALYSIS, ROUTINE W REFLEX MICROSCOPIC
Bilirubin Urine: NEGATIVE
Glucose, UA: NEGATIVE mg/dL
Hgb urine dipstick: NEGATIVE
Ketones, ur: NEGATIVE mg/dL
Leukocytes,Ua: NEGATIVE
Nitrite: NEGATIVE
Protein, ur: NEGATIVE mg/dL
Specific Gravity, Urine: 1.017 (ref 1.005–1.030)
pH: 5 (ref 5.0–8.0)

## 2019-11-01 LAB — RESPIRATORY PANEL BY RT PCR (FLU A&B, COVID)
Influenza A by PCR: NEGATIVE
Influenza B by PCR: NEGATIVE
SARS Coronavirus 2 by RT PCR: NEGATIVE

## 2019-11-01 LAB — TSH: TSH: 1.32 u[IU]/mL (ref 0.350–4.500)

## 2019-11-01 MED ORDER — HYDROCORTISONE (PERIANAL) 2.5 % EX CREA: 1.0000 "application " | TOPICAL_CREAM | Freq: Four times a day (QID) | CUTANEOUS | Status: DC | PRN

## 2019-11-01 MED ORDER — MOMETASONE FURO-FORMOTEROL FUM 100-5 MCG/ACT IN AERO
2.0000 | INHALATION_SPRAY | Freq: Two times a day (BID) | RESPIRATORY_TRACT | Status: DC
  Administered 2019-11-01 – 2019-11-03 (×5): 2 via RESPIRATORY_TRACT
  Filled 2019-11-01: qty 8.8

## 2019-11-01 MED ORDER — ALBUTEROL SULFATE (2.5 MG/3ML) 0.083% IN NEBU: 2.5000 mg | INHALATION_SOLUTION | RESPIRATORY_TRACT | Status: DC | PRN

## 2019-11-01 MED ORDER — ACETAMINOPHEN 650 MG RE SUPP: 650.0000 mg | Freq: Four times a day (QID) | RECTAL | Status: DC | PRN

## 2019-11-01 MED ORDER — ENALAPRILAT 1.25 MG/ML IV SOLN
0.6250 mg | Freq: Four times a day (QID) | INTRAVENOUS | Status: DC | PRN
  Filled 2019-11-01: qty 1

## 2019-11-01 MED ORDER — MENTHOL 3 MG MT LOZG: 1.0000 | LOZENGE | OROMUCOSAL | Status: DC | PRN

## 2019-11-01 MED ORDER — METHOCARBAMOL 1000 MG/10ML IJ SOLN
1000.0000 mg | Freq: Four times a day (QID) | INTRAVENOUS | Status: DC | PRN
  Filled 2019-11-01: qty 10

## 2019-11-01 MED ORDER — LIP MEDEX EX OINT: 1.0000 "application " | TOPICAL_OINTMENT | Freq: Two times a day (BID) | CUTANEOUS | Status: DC

## 2019-11-01 MED ORDER — LACTATED RINGERS IV SOLN: INTRAVENOUS | Status: AC

## 2019-11-01 MED ORDER — ONDANSETRON HCL 4 MG/2ML IJ SOLN
4.0000 mg | Freq: Four times a day (QID) | INTRAMUSCULAR | Status: DC | PRN
  Administered 2019-11-03: 4 mg via INTRAVENOUS
  Filled 2019-11-01: qty 2

## 2019-11-01 MED ORDER — TAMSULOSIN HCL 0.4 MG PO CAPS
0.4000 mg | ORAL_CAPSULE | Freq: Every day | ORAL | Status: DC
  Administered 2019-11-01 – 2019-11-03 (×3): 0.4 mg via ORAL
  Filled 2019-11-01 (×3): qty 1

## 2019-11-01 MED ORDER — ACETAMINOPHEN 325 MG PO TABS: 650.0000 mg | ORAL_TABLET | Freq: Four times a day (QID) | ORAL | Status: DC | PRN

## 2019-11-01 MED ORDER — PIPERACILLIN-TAZOBACTAM 3.375 G IVPB 30 MIN
3.3750 g | Freq: Once | INTRAVENOUS | Status: AC
Start: 2019-11-01 — End: 2019-11-01
  Administered 2019-11-01: 3.375 g via INTRAVENOUS
  Filled 2019-11-01: qty 50

## 2019-11-01 MED ORDER — POLYETHYLENE GLYCOL 3350 17 G PO PACK: 17.0000 g | PACK | Freq: Every day | ORAL | Status: DC | PRN

## 2019-11-01 MED ORDER — FENTANYL CITRATE (PF) 100 MCG/2ML IJ SOLN
50.0000 ug | Freq: Once | INTRAMUSCULAR | Status: AC
  Administered 2019-11-01: 50 ug via INTRAVENOUS
  Filled 2019-11-01: qty 2

## 2019-11-01 MED ORDER — IOHEXOL 300 MG/ML  SOLN
100.0000 mL | Freq: Once | INTRAMUSCULAR | Status: AC | PRN
  Administered 2019-11-01: 100 mL via INTRAVENOUS

## 2019-11-01 MED ORDER — METOPROLOL TARTRATE 5 MG/5ML IV SOLN: 5.0000 mg | Freq: Four times a day (QID) | INTRAVENOUS | Status: DC | PRN

## 2019-11-01 MED ORDER — MORPHINE SULFATE (PF) 4 MG/ML IV SOLN
4.0000 mg | INTRAVENOUS | Status: DC | PRN
  Administered 2019-11-02: 4 mg via INTRAVENOUS
  Filled 2019-11-01: qty 1

## 2019-11-01 MED ORDER — FENTANYL CITRATE (PF) 100 MCG/2ML IJ SOLN: 25.0000 ug | INTRAMUSCULAR | Status: DC | PRN

## 2019-11-01 MED ORDER — LACTATED RINGERS IV BOLUS: 1000.0000 mL | Freq: Once | INTRAVENOUS | Status: DC

## 2019-11-01 MED ORDER — HYDROCORTISONE 1 % EX CREA: 1.0000 "application " | TOPICAL_CREAM | Freq: Three times a day (TID) | CUTANEOUS | Status: DC | PRN

## 2019-11-01 MED ORDER — MORPHINE SULFATE (PF) 2 MG/ML IV SOLN
2.0000 mg | INTRAVENOUS | Status: DC | PRN
  Administered 2019-11-01 – 2019-11-03 (×6): 2 mg via INTRAVENOUS
  Filled 2019-11-01 (×6): qty 1

## 2019-11-01 MED ORDER — ALUM & MAG HYDROXIDE-SIMETH 200-200-20 MG/5ML PO SUSP: 30.0000 mL | Freq: Four times a day (QID) | ORAL | Status: DC | PRN

## 2019-11-01 MED ORDER — PHENOL 1.4 % MT LIQD: 1.0000 | OROMUCOSAL | Status: DC | PRN

## 2019-11-01 MED ORDER — LEVOTHYROXINE SODIUM 100 MCG PO TABS
100.0000 ug | ORAL_TABLET | Freq: Every day | ORAL | Status: DC
  Administered 2019-11-01 – 2019-11-03 (×3): 100 ug via ORAL
  Filled 2019-11-01 (×3): qty 1

## 2019-11-01 MED ORDER — SODIUM CHLORIDE 0.9% FLUSH: 3.0000 mL | Freq: Once | INTRAVENOUS | Status: DC

## 2019-11-01 MED ORDER — SODIUM CHLORIDE 0.9 % IV SOLN: INTRAVENOUS | Status: DC

## 2019-11-01 MED ORDER — FLUTICASONE-SALMETEROL 113-14 MCG/ACT IN AEPB: 2.0000 | INHALATION_SPRAY | Freq: Two times a day (BID) | RESPIRATORY_TRACT | Status: DC

## 2019-11-01 MED ORDER — MAGIC MOUTHWASH
15.0000 mL | Freq: Four times a day (QID) | ORAL | Status: DC | PRN
  Filled 2019-11-01: qty 15

## 2019-11-01 MED ORDER — PEG 3350-KCL-NA BICARB-NACL 420 G PO SOLR
4000.0000 mL | Freq: Once | ORAL | Status: AC
  Administered 2019-11-01: 4000 mL via ORAL
  Filled 2019-11-01 (×2): qty 4000

## 2019-11-01 MED ORDER — PIPERACILLIN-TAZOBACTAM 3.375 G IVPB 30 MIN
3.3750 g | Freq: Four times a day (QID) | INTRAVENOUS | Status: DC
  Administered 2019-11-01 – 2019-11-02 (×4): 3.375 g via INTRAVENOUS
  Filled 2019-11-01 (×4): qty 50

## 2019-11-01 MED ORDER — ALPRAZOLAM 1 MG PO TABS
1.0000 mg | ORAL_TABLET | Freq: Two times a day (BID) | ORAL | Status: DC | PRN
  Administered 2019-11-01: 1 mg via ORAL
  Filled 2019-11-01: qty 1

## 2019-11-01 MED ORDER — PROCHLORPERAZINE EDISYLATE 10 MG/2ML IJ SOLN: 5.0000 mg | INTRAMUSCULAR | Status: DC | PRN

## 2019-11-01 MED ORDER — SODIUM CHLORIDE 0.9 % IV SOLN
8.0000 mg | Freq: Four times a day (QID) | INTRAVENOUS | Status: DC | PRN
  Filled 2019-11-01: qty 4

## 2019-11-01 MED ORDER — LACTATED RINGERS IV BOLUS
1000.0000 mL | Freq: Once | INTRAVENOUS | Status: AC
  Administered 2019-11-01: 1000 mL via INTRAVENOUS

## 2019-11-01 MED ORDER — SODIUM CHLORIDE (PF) 0.9 % IJ SOLN
INTRAMUSCULAR | Status: AC
  Filled 2019-11-01: qty 50

## 2019-11-01 MED ORDER — ONDANSETRON HCL 4 MG/2ML IJ SOLN
4.0000 mg | Freq: Once | INTRAMUSCULAR | Status: AC
  Administered 2019-11-01: 4 mg via INTRAVENOUS
  Filled 2019-11-01: qty 2

## 2019-11-01 MED ORDER — GUAIFENESIN-DM 100-10 MG/5ML PO SYRP: 10.0000 mL | ORAL_SOLUTION | ORAL | Status: DC | PRN

## 2019-11-01 MED ORDER — PRAVASTATIN SODIUM 20 MG PO TABS
20.0000 mg | ORAL_TABLET | Freq: Every day | ORAL | Status: DC
  Administered 2019-11-01 – 2019-11-02 (×2): 20 mg via ORAL
  Filled 2019-11-01 (×2): qty 1

## 2019-11-01 NOTE — H&P (Signed)
History and Physical    Mark Giles [id number] DOB: 04-08-1946 DOA: 11/01/2019  PCP: Wenda Low, MD  Patient coming from: Home   Chief Complaint:  Chief Complaint  Patient presents with  . Abdominal Pain  . Flank Pain     HPI:  74 year old male with past medical history of COPD, hyperlipidemia, generalized anxiety disorder, chronic low back pain and marijuana abuse who presents to [facility] emergency department with complaints of abdominal pain.  Patient explains that approximately 2 months ago he began to experience abdominal pain.  This abdominal pain is located in the right lower quadrant.  This abdominal pain has been essentially constant for the past 2 months but waxing and waning in intensity.   Patient explains that for several months preceding the onset of this pain he has been ingesting marijuana wax daily.  When his abdominal pain started he attributed it to the ingestion of the wax and stopped around that time.  Despite cessation of ingestion of the wax his abdominal pain continued.  Patient explains that in the past 3 days the pain has become particularly severe, 8 out of 10 in intensity, sharp in quality, located in the right lower quadrant and radiating towards the umbilical region at times.  Pain is worse with movement and improved with rest.  This is associated with poor appetite.  Patient complains of nausea but denies vomiting.  Patient denies fever.  Patient denies dysuria sick contacts diarrhea or blood in the stool.  Patient is right lower quadrant pain continue to worsen until he eventually presented to [facility] emergency department for evaluation.  Upon evaluation in the emergency department CT imaging of the abdomen and pelvis revealed a possible cecal mass with adjacent early appendicitis.  Additionally there was concern on CT for sigmoid diverticulitis.  Because of this constellation of findings the case was discussed with Dr.  Johney Maine with general surgery who agreed to consult with the patient in the morning for the possible early appendicitis.  Hospitalist group was then called to assess the patient for admission the hospital.  Review of Systems: A 10-system review of systems has been performed and all systems are negative with the exception of what is listed in the HPI.    Past Medical History:  Diagnosis Date  . Anxiety   . Chronic back pain    History of   . COPD (chronic obstructive pulmonary disease) ([place])   . Dyslipidemia   . H/O: depression   . Hyperlipidemia   . Inguinal hernia without mention of obstruction or gangrene, unilateral or unspecified, (not specified as recurrent) 2013  . Tobacco abuse     Past Surgical History:  Procedure Laterality Date  . COLONOSCOPY WITH PROPOFOL N/A 10/22/2012   Procedure: COLONOSCOPY WITH PROPOFOL;  Surgeon: Garlan Fair, MD;  Location: [facility] ENDOSCOPY;  Service: Endoscopy;  Laterality: N/A;  . HERNIA REPAIR  Jul 06, 2012  . LAPAROTOMY     Trauma  . LUMBAR FUSION  2007   L4-L5     reports that he quit smoking about 5 years ago. His smoking use included cigarettes and e-cigarettes. He has a 90.00 pack-year smoking history. He has never used smokeless tobacco. He reports current alcohol use. He reports that he does not use drugs.  No Known Allergies  Family History  Problem Relation Age of Onset  . Heart disease Paternal Grandfather   . Obesity Mother        general poor health, unknown  specifics  . Heart attack Father        age 47, heavy smoker  . Other Sister        ?overdose of pain meds/alcohol  . Migraines Brother   . COPD Brother      Prior to Admission medications   Medication Sig Start Date End Date Taking? Authorizing Provider  albuterol (VENTOLIN HFA) 108 (90 Base) MCG/ACT inhaler Inhale 2 puffs into the lungs every 4 (four) hours as needed. 05/20/19   [provider]  ALPRAZolam Duanne Moron) 1 MG tablet Take 0.5 mg by mouth 2 (two)  times daily.  04/29/12   [provider]  buPROPion (WELLBUTRIN XL) 300 MG 24 hr tablet Take 300 mg by mouth daily after breakfast. 10/25/19   [provider]  escitalopram (LEXAPRO) 10 MG tablet Take 10 mg by mouth daily.    [provider]  Fluticasone-Salmeterol 113-14 MCG/ACT AEPB INHALE 2 PUFFS BY MOUTH INTO THE LUNGS TWICE DAILY 04/04/17   Tanda Rockers, MD  levothyroxine (SYNTHROID, LEVOTHROID) 100 MCG tablet Take 100 mcg by mouth daily before breakfast.    [provider]  lovastatin (MEVACOR) 20 MG tablet Take 20 mg by mouth at bedtime.  04/29/12   [provider]  morphine (MS CONTIN) 30 MG 12 hr tablet Take 30 mg by mouth 2 (two) times daily.  04/29/12   [provider]  tamsulosin (FLOMAX) 0.4 MG CAPS capsule Take 0.4 mg by mouth daily. 09/26/19   [provider]  traZODone (DESYREL) 50 MG tablet Take 50 mg by mouth at bedtime. 10/14/19   [provider]    Physical Exam: Vitals:   11/01/19 0032 11/01/19 0200  BP: (!) 146/81 112/66  Pulse: 79   Resp: 16   Temp: 97.6 F (36.4 C)   TempSrc: Oral   SpO2: 98%   Weight: 70.3 kg   Height: 5\' 10"  (1.778 m)     Constitutional: Acute alert and oriented x3, no associated distress.   Skin: no rashes, no lesions, slightly poor skin turgor noted. Eyes: Pupils are equally reactive to light.  No evidence of scleral icterus or conjunctival pallor.  ENMT: Dry mucous membranes noted.  Posterior pharynx clear of any exudate or lesions.   Neck: normal, supple, no masses, no thyromegaly.  No evidence of jugular venous distension.   Respiratory: clear to auscultation bilaterally, no wheezing, no crackles. Normal respiratory effort. No accessory muscle use.  Cardiovascular: Regular rate and rhythm, no murmurs / rubs / gallops. No extremity edema. 2+ pedal pulses. No carotid bruits.  Chest:   Nontender without crepitus or deformity.   Back:   Nontender without crepitus or  deformity. Abdomen: Notable right lower quadrant tenderness.  Abdomen is soft however.  Slightly hyperactive bowel sounds noted.    No evidence of intra-abdominal masses.  Positive bowel sounds noted in all quadrants.   Musculoskeletal: No joint deformity upper and lower extremities. Good ROM, no contractures. Normal muscle tone.  Neurologic: CN 2-12 grossly intact. Sensation intact, strength noted to be 5 out of 5 in all 4 extremities.  Patient is following all commands.  Patient is responsive to verbal stimuli.   Psychiatric: Patient presents as a normal mood with appropriate affect.  Patient seems to possess insight as to theircurrent situation.     Labs on Admission: I have personally reviewed following labs and imaging studies -   CBC: Recent Labs  Lab 11/01/19 0130  WBC 8.4  NEUTROABS 5.3  HGB 15.4  HCT 45.8  MCV 96.0  PLT [id number]   Basic Metabolic Panel: Recent Labs  Lab 11/01/19 0130  NA 139  K 4.1  CL 105  CO2 28  GLUCOSE 105*  BUN 8  CREATININE 0.77  CALCIUM 9.3   GFR: Estimated Creatinine Clearance: 83 mL/min (by C-G formula based on SCr of 0.77 mg/dL). Liver Function Tests: Recent Labs  Lab 11/01/19 0130  AST 24  ALT 22  ALKPHOS 45  BILITOT 1.0  PROT 6.5  ALBUMIN 3.9   Recent Labs  Lab 11/01/19 0130  LIPASE 45   No results for input(s): AMMONIA in the last 168 hours. Coagulation Profile: No results for input(s): INR, PROTIME in the last 168 hours. Cardiac Enzymes: No results for input(s): CKTOTAL, CKMB, CKMBINDEX, TROPONINI in the last 168 hours. BNP (last 3 results) No results for input(s): PROBNP in the last 8760 hours. HbA1C: No results for input(s): HGBA1C in the last 72 hours. CBG: No results for input(s): GLUCAP in the last 168 hours. Lipid Profile: No results for input(s): CHOL, HDL, LDLCALC, TRIG, CHOLHDL, LDLDIRECT in the last 72 hours. Thyroid Function Tests: No results for input(s): TSH, T4TOTAL, FREET4, T3FREE, THYROIDAB in the last  72 hours. Anemia Panel: No results for input(s): VITAMINB12, FOLATE, FERRITIN, TIBC, IRON, RETICCTPCT in the last 72 hours. Urine analysis:    Component Value Date/Time   COLORURINE YELLOW 11/01/2019 [place] 11/01/2019 0049   LABSPEC 1.017 11/01/2019 0049   PHURINE 5.0 11/01/2019 0049   GLUCOSEU NEGATIVE 11/01/2019 0049   HGBUR NEGATIVE 11/01/2019 0049   BILIRUBINUR NEGATIVE 11/01/2019 0049   KETONESUR NEGATIVE 11/01/2019 0049   PROTEINUR NEGATIVE 11/01/2019 0049   NITRITE NEGATIVE 11/01/2019 0049   LEUKOCYTESUR NEGATIVE 11/01/2019 0049    Radiological Exams on Admission - Personally Reviewed: CT ABDOMEN PELVIS W CONTRAST  Addendum Date: 11/01/2019   ADDENDUM REPORT: 11/01/2019 03:23 ADDENDUM: Upon further review there appears to be a rounded hyperdense masslike area within the right lower quadrant likely within the distal cecum, series 2, image 42 extending from this area is the appendix which appears to be mildly dilated measuring up to 1.1 cm. There is mild fat stranding changes seen around the appendix. No surrounding fluid collections or free air is seen. This could represent a possible focus of vascular malformation/angio dysplasia within the cecum and early mild appendicitis. No periappendiceal abscess or evidence of perforation. These results were called by telephone at the time of interpretation on 11/01/2019 at 3:23 am to provider [facility] , who verbally acknowledged these results. Electronically Signed   By: Prudencio Pair M.D.   On: 11/01/2019 03:23   Result Date: 11/01/2019 CLINICAL DATA:  Right-sided flank pain EXAM: CT ABDOMEN AND PELVIS WITH CONTRAST TECHNIQUE: Multidetector CT imaging of the abdomen and pelvis was performed using the standard protocol following bolus administration of intravenous contrast. CONTRAST:  117mL OMNIPAQUE IOHEXOL 300 MG/ML  SOLN COMPARISON:  None. FINDINGS: Lower chest: The visualized heart size within normal limits. No pericardial  fluid/thickening. No hiatal hernia. Bilateral scarring seen at both lung bases. Hepatobiliary: The liver is normal in density without focal abnormality.The main portal vein is patent. No evidence of calcified gallstones, gallbladder wall thickening or biliary dilatation. Pancreas: Unremarkable. No pancreatic ductal dilatation or surrounding inflammatory changes. Spleen: Normal in size without focal abnormality. Adrenals/Urinary Tract: Both adrenal glands appear normal. The right kidney is unremarkable. 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 No hydronephrosis is seen. The bladder is unremarkable. Stomach/Bowel: The stomach and small bowel are unremarkable. There is scattered colonic diverticula noted. There is diffuse wall thickening seen within a 10 cm segment of sigmoid colon with mild surrounding fat stranding changes. No loculated fluid collections however are seen. No surrounding free air. Vascular/Lymphatic: There are no enlarged mesenteric, retroperitoneal, or pelvic lymph nodes. Scattered aortic atherosclerotic calcifications are seen without aneurysmal dilatation. Reproductive: The prostate is unremarkable. Other: No evidence of abdominal wall mass or hernia. Musculoskeletal: There is a mild levoconvex scoliotic curvature of the lumbar spine. Posterior spinal fixation seen at L4-L5. IMPRESSION: 1. Findings suggestive of acute mild sigmoid colonic diverticulitis. No surrounding pericolonic fluid collections or free air. 2.  Aortic Atherosclerosis (ICD10-I70.0). Electronically Signed: By: Prudencio Pair M.D. On: 11/01/2019 02:48    Assessment/Plan Principal Problem:   Right lower quadrant pain   Patient presenting with 35-month history of progressively worsening abdominal pain, particularly severe in the last 3 days.  CT imaging of the abdomen reveals multiple concerning findings including a possible cecal  mass, possible adjacent appendicitis and possible sigmoid diverticulitis  Patient reports a history of several months of daily ingestion of marijuana wax.  With that degree of significant amounts of ingestion I am concerned that this habit may have precipitated the patient's abdominal pain and associated complications.  Emergency department provider has already initiated intravenous Zosyn which we will continue at this time  Emergency department provider has also already discussed case with Dr. Johney Maine with general surgery who has agreed to consult on the patient this morning  We will keep patient n.p.o. in the meantime  Hydrating patient with intravenous isotonic fluids  Intravenous as needed antiemetics  Intravenous as needed opiate-based analgesics  Active Problems:   COPD (chronic obstructive pulmonary disease) ([place])   No evidence of exacerbation at this time  Continue home regimen of maintenance inhalers  As needed bronchodilator therapy for shortness of breath and wheezing    Marijuana abuse   Patient reports that he has ceased use of marijuana laxatives the onset of his abdominal pain and is no longer smoking marijuana  Will continue to counsel on cessation    Hypothyroidism   Continuing home regimen of Synthroid  TSH to ensure that this is at target    Generalized anxiety disorder   As needed Xanax per my review of the controlled substance database.    Benign prostatic hyperplasia    Continuing home regimen of Flomax daily    Code Status:  Full code Family Communication: Deferred  Status is: Observation  The patient remains OBS appropriate and will d/c before 2 midnights.  Dispo: The patient is from: Home              Anticipated d/c is to: Home              Anticipated d/c date is: 2 days              Patient currently is not medically stable to d/c.        Vernelle Emerald MD [facility] Pager [phone]  If 7PM-7AM, please  contact night-coverage [email or web address] Use universal [place] password for that web site. If you do not have the password, please call the hospital operator.  11/01/2019, 4:23 AM

## 2019-11-01 NOTE — ED Notes (Signed)
Pt up walking to the bathroom. Denies any other needs. Will continue to monitor.

## 2019-11-01 NOTE — Progress Notes (Signed)
Called [facility] for consult - Dr Cristina Gong will see & offer help  Will add Dr Cristina Gong to inpatient care team
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
## 2019-11-01 NOTE — ED Notes (Signed)
Pt lying in bed awake and talking. Pt denies any needs. Will continue to monitor.

## 2019-11-01 NOTE — Progress Notes (Signed)
PROGRESS NOTE  Mark Giles [id number] DOB: 1946-06-28 DOA: 11/01/2019 PCP: Wenda Low, MD  HPI/Recap of past 35 hours: 74 year old male with past medical history of COPD and marijuana use plus hypothyroidism presented to the emergency room on the early morning of 5/1 with complaints of 1 month history of persistent waxing and waning abdominal pain to the point where he is not really been able to eat, all over, but has recently settled more into the right lower quadrant.  No fevers, change in bowel habits or rectal bleeding or nausea or vomiting or weight loss.  In the emergency room, CT scan noted questionable mass at the base of the cecum perhaps arising from involving the appendix as well as signs of sigmoid diverticulitis.  There is also reports of several months of daily ingestion of marijuana wax.  With these findings, patient admitted to hospitalist service.  General surgery consulted and recommended GI consultation and scope.  There is concern over the possibility of a possible GI carcinoma.  Patient seen by GI with plans for colonoscopy tomorrow.  Patient seen later this morning following admission.  Still some abdominal pain although not as severe.  Eager to have scope done and get answers.  Assessment/Plan: Principal Problem:   Right lower quadrant pain with mass of the cecal mesentery seen on CT: Surgery following.  GI consulted and plans for scope tomorrow. Active Problems:   COPD (chronic obstructive pulmonary disease) ([facility]): Breathing stable.    Marijuana abuse   Hypothyroidism: Continue Synthroid    Generalized anxiety disorder: Stable.    Benign prostatic hyperplasia: Stable at this time    Tobacco abuse: Quit smoking 5 years ago.    History of self injury/history of marijuana ingestion/abuse: Monitor closely.  Code Status: Full code  Family Communication: Patient has been speaking with his wife, he tells me that he will let me know if I need to speak with  her if she has any questions  Disposition Plan: Dependent on colonoscopy findings tomorrow   Consultants:  General surgery  GI  Procedures:  Plan colonoscopy 5/2  Antimicrobials:  IV Zosyn 5/1-present  DVT prophylaxis: SCDs   Objective: Vitals:   11/01/19 1056 11/01/19 1247  BP:  123/75  Pulse:  61  Resp:  16  Temp:  97.7 F (36.5 C)  SpO2: (!) 79% 96%    Intake/Output Summary (Last 24 hours) at 11/01/2019 1614 Last data filed at 11/01/2019 0613 Gross per 24 hour  Intake 550 ml  Output --  Net 550 ml   Filed Weights   11/01/19 0032  Weight: 70.3 kg   Body mass index is 22.24 kg/m.  Exam:   General: Alert and oriented x3, no acute distress  Cardiovascular: Regular rate and rhythm, S1-S2  Respiratory: Clear to auscultation bilaterally  Abdomen: Soft, distended abdomen, nontender, hypoactive bowel sounds  Musculoskeletal: No clubbing or cyanosis or edema  Skin: No skin breaks, tears or lesions  Psychiatry: Appropriate, no evidence of psychoses   Data Reviewed: CBC: Recent Labs  Lab 11/01/19 0130  WBC 8.4  NEUTROABS 5.3  HGB 15.4  HCT 45.8  MCV 96.0  PLT [id number]   Basic Metabolic Panel: Recent Labs  Lab 11/01/19 0130  NA 139  K 4.1  CL 105  CO2 28  GLUCOSE 105*  BUN 8  CREATININE 0.77  CALCIUM 9.3   GFR: Estimated Creatinine Clearance: 83 mL/min (by C-G formula based on SCr of 0.77 mg/dL). Liver Function Tests: Recent Labs  Lab  11/01/19 0130  AST 24  ALT 22  ALKPHOS 45  BILITOT 1.0  PROT 6.5  ALBUMIN 3.9   Recent Labs  Lab 11/01/19 0130  LIPASE 45   No results for input(s): AMMONIA in the last 168 hours. Coagulation Profile: No results for input(s): INR, PROTIME in the last 168 hours. Cardiac Enzymes: No results for input(s): CKTOTAL, CKMB, CKMBINDEX, TROPONINI in the last 168 hours. BNP (last 3 results) No results for input(s): PROBNP in the last 8760 hours. HbA1C: No results for input(s): HGBA1C in the last 72  hours. CBG: No results for input(s): GLUCAP in the last 168 hours. Lipid Profile: No results for input(s): CHOL, HDL, LDLCALC, TRIG, CHOLHDL, LDLDIRECT in the last 72 hours. Thyroid Function Tests: Recent Labs    11/01/19 0130  TSH 1.320   Anemia Panel: No results for input(s): VITAMINB12, FOLATE, FERRITIN, TIBC, IRON, RETICCTPCT in the last 72 hours. Urine analysis:    Component Value Date/Time   COLORURINE YELLOW 11/01/2019 0049   APPEARANCEUR CLEAR 11/01/2019 0049   LABSPEC 1.017 11/01/2019 0049   PHURINE 5.0 11/01/2019 0049   GLUCOSEU NEGATIVE 11/01/2019 0049   HGBUR NEGATIVE 11/01/2019 0049   BILIRUBINUR NEGATIVE 11/01/2019 0049   KETONESUR NEGATIVE 11/01/2019 0049   PROTEINUR NEGATIVE 11/01/2019 0049   NITRITE NEGATIVE 11/01/2019 0049   LEUKOCYTESUR NEGATIVE 11/01/2019 0049   Sepsis Labs: @LABRCNTIP (procalcitonin:4,lacticidven:4)  ) Recent Results (from the past 240 hour(s))  Respiratory Panel by RT PCR (Flu A&B, Covid) - Nasopharyngeal Swab     Status: None   Collection Time: 11/01/19  3:35 AM   Specimen: Nasopharyngeal Swab  Result Value Ref Range Status   SARS Coronavirus 2 by RT PCR NEGATIVE NEGATIVE Final    Comment: (NOTE) SARS-CoV-2 target nucleic acids are NOT DETECTED. The SARS-CoV-2 RNA is generally detectable in upper respiratoy specimens during the acute phase of infection. The lowest concentration of SARS-CoV-2 viral copies this assay can detect is 131 copies/mL. A negative result does not preclude SARS-Cov-2 infection and should not be used as the sole basis for treatment or other patient management decisions. A negative result may occur with  improper specimen collection/handling, submission of specimen other than nasopharyngeal swab, presence of viral mutation(s) within the areas targeted by this assay, and inadequate number of viral copies (<131 copies/mL). A negative result must be combined with clinical observations, patient history, and  epidemiological information. The expected result is Negative. Fact Sheet for Patients:  PinkCheek.be Fact Sheet for Healthcare Providers:  GravelBags.it This test is not yet ap proved or cleared by the [place] FDA and  has been authorized for detection and/or diagnosis of SARS-CoV-2 by FDA under an Emergency Use Authorization (EUA). This EUA will remain  in effect (meaning this test can be used) for the duration of the COVID-19 declaration under Section 564(b)(1) of the Act, 21 U.S.C. section 360bbb-3(b)(1), unless the authorization is terminated or revoked sooner.    Influenza A by PCR NEGATIVE NEGATIVE Final   Influenza B by PCR NEGATIVE NEGATIVE Final    Comment: (NOTE) The Xpert Xpress SARS-CoV-2/FLU/RSV assay is intended as an aid in  the diagnosis of influenza from Nasopharyngeal swab specimens and  should not be used as a sole basis for treatment. Nasal washings and  aspirates are unacceptable for Xpert Xpress SARS-CoV-2/FLU/RSV  testing. Fact Sheet for Patients: PinkCheek.be Fact Sheet for Healthcare Providers: GravelBags.it This test is not yet approved or cleared by the [place] FDA and  has been authorized for detection and/or  diagnosis of SARS-CoV-2 by  FDA under an Emergency Use Authorization (EUA). This EUA will remain  in effect (meaning this test can be used) for the duration of the  Covid-19 declaration under Section 564(b)(1) of the Act, 21  U.S.C. section 360bbb-3(b)(1), unless the authorization is  terminated or revoked. Performed at [facility], [place] [address] [place], [place] [address]       Studies: CT ABDOMEN PELVIS W CONTRAST  Addendum Date: 11/01/2019   ADDENDUM REPORT: 11/01/2019 05:51 ADDENDUM: The surgeon Dr. Alwyn Pea called for a second opinion on this case at 4:30am on 11/01/2019. There is a  hyperenhancing poorly marginated 3.0 x 2.4 cm mass in the right abdomen at the base of the cecum (series 2/image 41), highly suspicious for primary colonic (cecal) or appendiceal malignancy. The appendix is mildly dilated (1.1 cm diameter) without significant appendiceal wall thickening or periappendiceal fat stranding. There is irregular peritoneal thickening and enhancement throughout the anterior pelvis (series 2/image 61) and along the left pelvic sidewall (series 2/image 57). There is an irregular enhancing 1.0 cm soft tissue implant in the anterior right pelvic peritoneum (series 2/image 58). Several tiny soft tissue implants are noted throughout the right paracolic gutter, largest 0.6 cm (series 2/image 41). 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 There is trace perihepatic and deep pelvic ascites. These addended results were called by telephone at the time of interpretation on 11/01/2019 at 4:30 am to provider DR. STEPHEN GROSS, who verbally acknowledged these results. Electronically Signed   By: Ilona Sorrel M.D.   On: 11/01/2019 05:51   Addendum Date: 11/01/2019   ADDENDUM REPORT: 11/01/2019 03:23 ADDENDUM: Upon further review there appears to be a rounded hyperdense masslike area within the right lower quadrant likely within the distal cecum, series 2, image 42 extending from this area is the appendix which appears to be mildly dilated measuring up to 1.1 cm. There is mild fat stranding changes seen around the appendix. No surrounding fluid collections or free air is seen. This could represent a possible focus of vascular malformation/angio dysplasia within the cecum and early mild appendicitis. No periappendiceal abscess or evidence of perforation. These results were called by telephone at the time of interpretation on 11/01/2019 at 3:23 am to provider [facility] , who verbally acknowledged these results. Electronically Signed   By: Prudencio Pair M.D.   On: 11/01/2019 03:23    Result Date: 11/01/2019 CLINICAL DATA:  Right-sided flank pain EXAM: CT ABDOMEN AND PELVIS WITH CONTRAST TECHNIQUE: Multidetector CT imaging of the abdomen and pelvis was performed using the standard protocol following bolus administration of intravenous contrast. CONTRAST:  149mL OMNIPAQUE IOHEXOL 300 MG/ML  SOLN COMPARISON:  None. FINDINGS: Lower chest: The visualized heart size within normal limits. No pericardial fluid/thickening. No hiatal hernia. Bilateral scarring seen at both lung bases. Hepatobiliary: The liver is normal in density without focal abnormality.The main portal vein is patent. No evidence of calcified gallstones, gallbladder wall thickening or biliary dilatation. Pancreas: Unremarkable. No pancreatic ductal dilatation or surrounding inflammatory changes. Spleen: Normal in size without focal abnormality. Adrenals/Urinary Tract: Both adrenal glands appear normal. The right kidney is unremarkable. 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 No hydronephrosis is seen. The bladder is unremarkable. Stomach/Bowel: The stomach and small bowel are unremarkable. There is scattered colonic diverticula noted. There is diffuse wall thickening seen within a 10 cm segment of sigmoid  colon with mild surrounding fat stranding changes. No loculated fluid collections however are seen. No surrounding free air. Vascular/Lymphatic: There are no enlarged mesenteric, retroperitoneal, or pelvic lymph nodes. Scattered aortic atherosclerotic calcifications are seen without aneurysmal dilatation. Reproductive: The prostate is unremarkable. Other: No evidence of abdominal wall mass or hernia. Musculoskeletal: There is a mild levoconvex scoliotic curvature of the lumbar spine. Posterior spinal fixation seen at L4-L5. IMPRESSION: 1. Findings suggestive of acute mild sigmoid colonic diverticulitis. No surrounding pericolonic fluid  collections or free air. 2.  Aortic Atherosclerosis (ICD10-I70.0). Electronically Signed: By: Prudencio Pair M.D. On: 11/01/2019 02:48    Scheduled Meds:  levothyroxine  100 mcg Oral Q0600   mometasone-formoterol  2 puff Inhalation BID   pravastatin  20 mg Oral q1800   tamsulosin  0.4 mg Oral Daily    Continuous Infusions:  lactated ringers 125 mL/hr at 11/01/19 1529   ondansetron (ZOFRAN) IV     piperacillin-tazobactam 3.375 g (11/01/19 1057)     LOS: 0 days     Annita Brod, MD [facility]   11/01/2019, 4:14 PM

## 2019-11-01 NOTE — ED Triage Notes (Signed)
Patient is complaining of right flank pain and right lower abdominal pain that started 30 days ago but has gotten worse over the last few days per patient.

## 2019-11-01 NOTE — Plan of Care (Signed)
Discussed with patient plan of care for the evening, pain management and colonoscopy with consent signed with some teach back displayed.

## 2019-11-01 NOTE — Anesthesia Preprocedure Evaluation (Addendum)
Anesthesia Evaluation  Patient identified by MRN, date of birth, ID band Patient awake    Reviewed: Allergy & Precautions, NPO status , Patient's Chart, lab work & pertinent test results  History of Anesthesia Complications Negative for: history of anesthetic complications  Airway Mallampati: I  TM Distance: >3 FB Neck ROM: Full    Dental  (+) Edentulous Upper, Edentulous Lower, Lower Dentures, Upper Dentures, Dental Advisory Given   Pulmonary shortness of breath and with exertion, COPD (well controlled),  COPD inhaler, former smoker,    breath sounds clear to auscultation       Cardiovascular (-) hypertensionnegative cardio ROS   Rhythm:Regular Rate:Normal  '18 ECHO: normal LVF, normal valves  '18 Stress: EF 51%, anterior scar, no ischemia   Neuro/Psych Anxiety Chronic back pain    GI/Hepatic Neg liver ROS, Cecal mass   Endo/Other  Hypothyroidism   Renal/GU negative Renal ROS     Musculoskeletal   Abdominal   Peds  Hematology negative hematology ROS (+)   Anesthesia Other Findings   Reproductive/Obstetrics                            Anesthesia Physical Anesthesia Plan  ASA: II  Anesthesia Plan: MAC   Post-op Pain Management:    Induction:   PONV Risk Score and Plan: 1 and Treatment may vary due to age or medical condition  Airway Management Planned: Natural Airway and Simple Face Mask  Additional Equipment:   Intra-op Plan:   Post-operative Plan:   Informed Consent: I have reviewed the patients History and Physical, chart, labs and discussed the procedure including the risks, benefits and alternatives for the proposed anesthesia with the patient or authorized representative who has indicated his/her understanding and acceptance.     Dental advisory given  Plan Discussed with: CRNA and Surgeon  Anesthesia Plan Comments:        Anesthesia Quick Evaluation

## 2019-11-01 NOTE — ED Notes (Signed)
Pt lying in bed. Back from CT. Urinal given. Pt denies any other needs.

## 2019-11-01 NOTE — ED Provider Notes (Signed)
TIME SEEN: 12:50 AM  CHIEF COMPLAINT: Abdominal pain  HPI: Patient is a 74 year old male with history of COPD, chronic back pain on morphine, dyslipidemia who presents to the emergency department with complaints of 30 days of upper abdominal pain that has now radiated into the periumbilical region and right lower quadrant for the past 3 days.  He denies aggravating or alleviating factors.  He states he is concerned he could have appendicitis based on online research he has done.  He denies fevers, nausea, vomiting but has had diarrhea.  No dysuria or hematuria.  No history of kidney stones.  No previous abdominal surgery.  ROS: See HPI Constitutional: no fever  Eyes: no drainage  ENT: no runny nose   Cardiovascular:  no chest pain  Resp: no SOB  GI: no vomiting GU: no dysuria Integumentary: no rash  Allergy: no hives  Musculoskeletal: no leg swelling  Neurological: no slurred speech ROS otherwise negative  PAST MEDICAL HISTORY/PAST SURGICAL HISTORY:  Past Medical History:  Diagnosis Date  . Anxiety   . Chronic back pain    History of   . COPD (chronic obstructive pulmonary disease) ([place])   . Dyslipidemia   . H/O: depression   . Hyperlipidemia   . Inguinal hernia without mention of obstruction or gangrene, unilateral or unspecified, (not specified as recurrent) 2013  . Tobacco abuse     MEDICATIONS:  Prior to Admission medications   Medication Sig Start Date End Date Taking? Authorizing Provider  ALPRAZolam Duanne Moron) 1 MG tablet Take 0.5 mg by mouth 2 (two) times daily.  04/29/12   [provider]  escitalopram (LEXAPRO) 10 MG tablet Take 10 mg by mouth daily.    [provider]  Fluticasone-Salmeterol 113-14 MCG/ACT AEPB INHALE 2 PUFFS BY MOUTH INTO THE LUNGS TWICE DAILY 04/04/17   Tanda Rockers, MD  levothyroxine (SYNTHROID, LEVOTHROID) 100 MCG tablet Take 100 mcg by mouth daily before breakfast.    [provider]  lovastatin (MEVACOR) 20 MG tablet  Take 20 mg by mouth at bedtime.  04/29/12   [provider]  morphine (MS CONTIN) 30 MG 12 hr tablet Take 30 mg by mouth 2 (two) times daily.  04/29/12   [provider]    ALLERGIES:  No Known Allergies  SOCIAL HISTORY:  Social History   Tobacco Use  . Smoking status: Current Every Day Smoker    Packs/day: 3.00    Years: 30.00    Pack years: 90.00    Types: Cigarettes, E-cigarettes    Last attempt to quit: 01/31/2014    Years since quitting: 5.7  . Smokeless tobacco: Never Used  . Tobacco comment: uses e-cig  Substance Use Topics  . Alcohol use: Yes    Alcohol/week: 0.0 standard drinks    Comment: occasional/social     FAMILY HISTORY: Family History  Problem Relation Age of Onset  . Heart disease Paternal Grandfather   . Obesity Mother        general poor health, unknown specifics  . Heart attack Father        age 32, heavy smoker  . Other Sister        ?overdose of pain meds/alcohol  . Migraines Brother   . COPD Brother     EXAM: BP (!) 146/81 (BP Location: Left Arm)   Pulse 79   Temp 97.6 F (36.4 C) (Oral)   Resp 16   Ht 5\' 10"  (1.778 m)   Wt 70.3 kg   SpO2  98%   BMI 22.24 kg/m  CONSTITUTIONAL: Alert and oriented and responds appropriately to questions. Well-appearing; well-nourished HEAD: Normocephalic EYES: Conjunctivae clear, pupils appear equal, EOM appear intact ENT: normal nose; moist mucous membranes NECK: Supple, normal ROM CARD: RRR; S1 and S2 appreciated; no murmurs, no clicks, no rubs, no gallops RESP: Normal chest excursion without splinting or tachypnea; breath sounds clear and equal bilaterally; no wheezes, no rhonchi, no rales, no hypoxia or respiratory distress, speaking full sentences ABD/GI: Normal bowel sounds; non-distended; soft, minimally tender in the mid abdomen and right lower quadrant, negative Murphy sign, no rebound, no guarding, no peritoneal signs, no hepatosplenomegaly BACK:  The back appears normal EXT:  Normal ROM in all joints; no deformity noted, no edema; no cyanosis SKIN: Normal color for age and race; warm; no rash on exposed skin NEURO: Moves all extremities equally PSYCH: The patient's mood and manner are appropriate.   MEDICAL DECISION MAKING: Patient here with mid abdominal pain, right lower quadrant pain with concerns for appendicitis.  Pain however seems atypical given it started a month ago that seems to now have radiated into this area.  Discussed with patient that this also could be a kidney stone.  He does not seem significantly tender over the gallbladder and has a negative Murphy sign today.  Will obtain labs, urine and CT abdomen pelvis.  Will provide with pain and nausea medicine.  Reports his wife drove him to the emergency department.  ED PROGRESS: Labs, urine reviewed/interpreted and show no acute abnormality.  No leukocytosis.  Normal LFTs and lipase.  Urine does not appear infected.  CT reviewed/interpreted and case discussed with radiologist.  She states that there are signs of mild acute sigmoid diverticulitis without perforation or abscess.  She also states there is a mass in the terminal ileum that is concerning for possible angiodysplasia/AVM.  He denies any bloody stools or melena.  She states that the appendix also appears enlarged with fat stranding and could represent early appendicitis.  He states the right lower quadrant at McBurney's point is where he is having his pain at this time.  Will give IV antibiotics to cover for possible early appendicitis as well as sigmoid diverticulitis.  Will discuss with medicine for admission as I feel he will need general surgery as well as GI consultation.  3:33 AM Discussed patient's case with hospitalist, Dr. Cyd Silence.  I have recommended admission and patient (and family if present) agree with this plan. Admitting physician will place admission orders.   I reviewed all nursing notes, vitals, pertinent previous records and  reviewed/interpreted all EKGs, lab and urine results, imaging (as available).    3:52 AM  Spoke with Dr. Johney Maine with general surgery.  Surgery will see in consultation.  Updated hospitalist.     Mark Giles was evaluated in Emergency Department on 11/01/2019 for the symptoms described in the history of present illness. He was evaluated in the context of the global COVID-19 pandemic, which necessitated consideration that the patient might be at risk for infection with the SARS-CoV-2 virus that causes COVID-19. Institutional protocols and algorithms that pertain to the evaluation of patients at risk for COVID-19 are in a state of rapid change based on information released by regulatory bodies including the CDC and federal and state organizations. These policies and algorithms were followed during the patient's care in the ED.      Mark Giles, Delice Bison, DO 11/01/19 0400

## 2019-11-01 NOTE — ED Notes (Signed)
Pt sitting up in bed awake watching t.v. pt denies any needs. Updated on plan of care. Will continue to monitor.

## 2019-11-02 ENCOUNTER — Encounter (HOSPITAL_COMMUNITY): Payer: Self-pay | Admitting: Internal Medicine

## 2019-11-02 ENCOUNTER — Observation Stay (HOSPITAL_COMMUNITY): Payer: Medicare Other | Admitting: Anesthesiology

## 2019-11-02 ENCOUNTER — Encounter (HOSPITAL_COMMUNITY)
Admission: EM | Disposition: A | Discharge status: Home / Self Care | Payer: Self-pay | Source: Home / Self Care | Attending: Internal Medicine

## 2019-11-02 DIAGNOSIS — E785 Hyperlipidemia, unspecified: Secondary | ICD-10-CM | POA: Diagnosis not present

## 2019-11-02 DIAGNOSIS — E039 Hypothyroidism, unspecified: Secondary | ICD-10-CM | POA: Diagnosis not present

## 2019-11-02 DIAGNOSIS — R109 Unspecified abdominal pain: Secondary | ICD-10-CM | POA: Diagnosis not present

## 2019-11-02 DIAGNOSIS — D490 Neoplasm of unspecified behavior of digestive system: Secondary | ICD-10-CM | POA: Diagnosis not present

## 2019-11-02 DIAGNOSIS — Z79890 Hormone replacement therapy (postmenopausal): Secondary | ICD-10-CM | POA: Diagnosis not present

## 2019-11-02 DIAGNOSIS — Z79899 Other long term (current) drug therapy: Secondary | ICD-10-CM | POA: Diagnosis not present

## 2019-11-02 DIAGNOSIS — Z87891 Personal history of nicotine dependence: Secondary | ICD-10-CM | POA: Diagnosis not present

## 2019-11-02 DIAGNOSIS — N400 Enlarged prostate without lower urinary tract symptoms: Secondary | ICD-10-CM | POA: Diagnosis not present

## 2019-11-02 DIAGNOSIS — K5732 Diverticulitis of large intestine without perforation or abscess without bleeding: Secondary | ICD-10-CM | POA: Diagnosis present

## 2019-11-02 DIAGNOSIS — R935 Abnormal findings on diagnostic imaging of other abdominal regions, including retroperitoneum: Secondary | ICD-10-CM | POA: Diagnosis not present

## 2019-11-02 DIAGNOSIS — Z20822 Contact with and (suspected) exposure to covid-19: Secondary | ICD-10-CM | POA: Diagnosis present

## 2019-11-02 DIAGNOSIS — F411 Generalized anxiety disorder: Secondary | ICD-10-CM | POA: Diagnosis present

## 2019-11-02 DIAGNOSIS — Z8249 Family history of ischemic heart disease and other diseases of the circulatory system: Secondary | ICD-10-CM | POA: Diagnosis not present

## 2019-11-02 DIAGNOSIS — R933 Abnormal findings on diagnostic imaging of other parts of digestive tract: Secondary | ICD-10-CM | POA: Diagnosis not present

## 2019-11-02 DIAGNOSIS — R1031 Right lower quadrant pain: Secondary | ICD-10-CM | POA: Diagnosis not present

## 2019-11-02 DIAGNOSIS — K6389 Other specified diseases of intestine: Secondary | ICD-10-CM | POA: Diagnosis present

## 2019-11-02 DIAGNOSIS — J449 Chronic obstructive pulmonary disease, unspecified: Secondary | ICD-10-CM | POA: Diagnosis not present

## 2019-11-02 DIAGNOSIS — F1210 Cannabis abuse, uncomplicated: Secondary | ICD-10-CM | POA: Diagnosis present

## 2019-11-02 DIAGNOSIS — M545 Low back pain: Secondary | ICD-10-CM | POA: Diagnosis present

## 2019-11-02 DIAGNOSIS — K56609 Unspecified intestinal obstruction, unspecified as to partial versus complete obstruction: Secondary | ICD-10-CM | POA: Diagnosis not present

## 2019-11-02 DIAGNOSIS — Z915 Personal history of self-harm: Secondary | ICD-10-CM | POA: Diagnosis not present

## 2019-11-02 DIAGNOSIS — Z981 Arthrodesis status: Secondary | ICD-10-CM | POA: Diagnosis not present

## 2019-11-02 DIAGNOSIS — K5730 Diverticulosis of large intestine without perforation or abscess without bleeding: Secondary | ICD-10-CM | POA: Diagnosis not present

## 2019-11-02 DIAGNOSIS — Z720 Tobacco use: Secondary | ICD-10-CM | POA: Diagnosis not present

## 2019-11-02 DIAGNOSIS — Z825 Family history of asthma and other chronic lower respiratory diseases: Secondary | ICD-10-CM | POA: Diagnosis not present

## 2019-11-02 DIAGNOSIS — G8929 Other chronic pain: Secondary | ICD-10-CM | POA: Diagnosis present

## 2019-11-02 HISTORY — PX: COLONOSCOPY WITH PROPOFOL: SHX5780

## 2019-11-02 HISTORY — PX: BIOPSY: SHX5522

## 2019-11-02 LAB — CBC WITH DIFFERENTIAL/PLATELET
Abs Immature Granulocytes: 0.03 10*3/uL (ref 0.00–0.07)
Basophils Absolute: 0.1 10*3/uL (ref 0.0–0.1)
Basophils Relative: 1 %
Eosinophils Absolute: 0.7 10*3/uL — ABNORMAL HIGH (ref 0.0–0.5)
Eosinophils Relative: 8 %
HCT: 46.9 % (ref 39.0–52.0)
Hemoglobin: 15 g/dL (ref 13.0–17.0)
Immature Granulocytes: 0 %
Lymphocytes Relative: 19 %
Lymphs Abs: 1.6 10*3/uL (ref 0.7–4.0)
MCH: 31.1 pg (ref 26.0–34.0)
MCHC: 32 g/dL (ref 30.0–36.0)
MCV: 97.3 fL (ref 80.0–100.0)
Monocytes Absolute: 1.1 10*3/uL — ABNORMAL HIGH (ref 0.1–1.0)
Monocytes Relative: 13 %
Neutro Abs: 5 10*3/uL (ref 1.7–7.7)
Neutrophils Relative %: 59 %
Platelets: 200 10*3/uL (ref 150–400)
RBC: 4.82 MIL/uL (ref 4.22–5.81)
RDW: 13.3 % (ref 11.5–15.5)
WBC: 8.5 10*3/uL (ref 4.0–10.5)
nRBC: 0 % (ref 0.0–0.2)

## 2019-11-02 LAB — COMPREHENSIVE METABOLIC PANEL
ALT: 20 U/L (ref 0–44)
AST: 19 U/L (ref 15–41)
Albumin: 4 g/dL (ref 3.5–5.0)
Alkaline Phosphatase: 40 U/L (ref 38–126)
Anion gap: 6 (ref 5–15)
BUN: 7 mg/dL — ABNORMAL LOW (ref 8–23)
CO2: 30 mmol/L (ref 22–32)
Calcium: 9.3 mg/dL (ref 8.9–10.3)
Chloride: 104 mmol/L (ref 98–111)
Creatinine, Ser: 0.95 mg/dL (ref 0.61–1.24)
GFR calc Af Amer: >60 mL/min (ref >60)
GFR calc non Af Amer: >60 mL/min (ref >60)
Glucose, Bld: 83 mg/dL (ref 70–99)
Potassium: 4.4 mmol/L (ref 3.5–5.1)
Sodium: 140 mmol/L (ref 135–145)
Total Bilirubin: 1.4 mg/dL — ABNORMAL HIGH (ref 0.3–1.2)
Total Protein: 6.3 g/dL — ABNORMAL LOW (ref 6.5–8.1)

## 2019-11-02 LAB — MAGNESIUM: Magnesium: 1.6 mg/dL — ABNORMAL LOW (ref 1.7–2.4)

## 2019-11-02 SURGERY — COLONOSCOPY WITH PROPOFOL
Anesthesia: Monitor Anesthesia Care

## 2019-11-02 MED ORDER — PIPERACILLIN-TAZOBACTAM 3.375 G IVPB
3.3750 g | Freq: Three times a day (TID) | INTRAVENOUS | Status: DC
  Administered 2019-11-03 (×2): 3.375 g via INTRAVENOUS
  Filled 2019-11-02 (×3): qty 50

## 2019-11-02 MED ORDER — PROPOFOL 500 MG/50ML IV EMUL
INTRAVENOUS | Status: AC
  Filled 2019-11-02: qty 150

## 2019-11-02 MED ORDER — SPOT INK MARKER SYRINGE KIT
PACK | SUBMUCOSAL | Status: AC
  Filled 2019-11-02: qty 5

## 2019-11-02 MED ORDER — LACTATED RINGERS IV SOLN: INTRAVENOUS | Status: DC | PRN

## 2019-11-02 MED ORDER — PIPERACILLIN-TAZOBACTAM 3.375 G IVPB
3.3750 g | Freq: Three times a day (TID) | INTRAVENOUS | Status: DC
  Administered 2019-11-02 (×2): 3.375 g via INTRAVENOUS
  Filled 2019-11-02 (×3): qty 50

## 2019-11-02 MED ORDER — SODIUM CHLORIDE 0.9 % IV SOLN
INTRAVENOUS | Status: DC | PRN
Start: 2019-11-02 — End: 2019-11-02

## 2019-11-02 MED ORDER — PROPOFOL 500 MG/50ML IV EMUL
INTRAVENOUS | Status: DC | PRN
  Administered 2019-11-02: 150 ug/kg/min via INTRAVENOUS

## 2019-11-02 SURGICAL SUPPLY — 21 items

## 2019-11-02 NOTE — Progress Notes (Signed)
PHARMACY NOTE:  ANTIMICROBIAL RENAL DOSAGE ADJUSTMENT  Current antimicrobial regimen includes a mismatch between antimicrobial dosage and estimated renal function.  As per policy approved by the Pharmacy & Therapeutics and Medical Executive Committees, the antimicrobial dosage will be adjusted accordingly.  Current antimicrobial dosage:  Zosyn 3.375 g IV q6h, infused over 30 minutes  Indication: Intra-abdominal Infection  Renal Function: Estimated Creatinine Clearance: 69.9 mL/min (by C-G formula based on SCr of 0.95 mg/dL). []      On intermittent HD, scheduled: []      On CRRT    Antimicrobial dosage has been changed to:  Zosyn 3.375g IV Q8H, infused over 4hrs.  Thank you for allowing pharmacy to be a part of this patient's care.  Gretta Arab PharmD, BCPS Clinical Pharmacist [facility] main pharmacy [phone] 11/02/2019 7:02 AM

## 2019-11-02 NOTE — Anesthesia Postprocedure Evaluation (Signed)
Anesthesia Post Note  Patient: Mark Giles  Procedure(s) Performed: COLONOSCOPY WITH PROPOFOL (N/A ) BIOPSY     Patient location during evaluation: Endoscopy Anesthesia Type: MAC Level of consciousness: awake and alert, oriented and patient cooperative Pain management: pain level controlled Vital Signs Assessment: post-procedure vital signs reviewed and stable Respiratory status: spontaneous breathing, nonlabored ventilation and respiratory function stable Cardiovascular status: blood pressure returned to baseline and stable Postop Assessment: no apparent nausea or vomiting Anesthetic complications: no    Last Vitals:  Vitals:   11/02/19 1105 11/02/19 1110  BP:    Pulse: (!) 52 (!) 48  Resp: 15 17  Temp:    SpO2: 100% 100%    Last Pain:  Vitals:   11/02/19 1102  TempSrc:   PainSc: 2                  Darryn Kydd,E. Shellene Sweigert

## 2019-11-02 NOTE — Transfer of Care (Signed)
Immediate Anesthesia Transfer of Care Note  Patient: Mark Giles  Procedure(s) Performed: COLONOSCOPY WITH PROPOFOL (N/A ) BIOPSY  Patient Location: PACU  Anesthesia Type:MAC  Level of Consciousness: awake, alert , oriented and patient cooperative  Airway & Oxygen Therapy: Patient Spontanous Breathing and Patient connected to face mask oxygen  Post-op Assessment: Report given to RN, Post -op Vital signs reviewed and stable and Patient moving all extremities X 4  Post vital signs: stable  Last Vitals:  Vitals Value Taken Time  BP    Temp    Pulse 42 11/02/19 1055  Resp 19 11/02/19 1057  SpO2 100 % 11/02/19 1055  Vitals shown include unvalidated device data.  Last Pain:  Vitals:   11/02/19 0943  TempSrc: Oral  PainSc: 0-No pain      Patients Stated Pain Goal: 2 (24/81/85 9093)  Complications: No apparent anesthesia complications

## 2019-11-02 NOTE — Anesthesia Procedure Notes (Signed)
Procedure Name: MAC Date/Time: 11/02/2019 9:55 AM Performed by: Lissa Morales, CRNA Pre-anesthesia Checklist: Patient identified, Emergency Drugs available, Suction available, Patient being monitored and Timeout performed Patient Re-evaluated:Patient Re-evaluated prior to induction Oxygen Delivery Method: Simple face mask Placement Confirmation: positive ETCO2

## 2019-11-02 NOTE — Op Note (Signed)
[facility] Patient Name: Mark Giles Procedure Date: 11/02/2019 MRN: [id number] Attending MD: Ronald Lobo , MD Date of Birth: [birth year] CSN: [id number] Age: 74 Admit Type: Inpatient Procedure:                Colonoscopy Indications:              Last colonoscopy: 2014, , Abdominal pain in the                            right lower quadrant, Abnormal CT of the GI tract                            (Dr. Wynetta Emery) Providers:                Ronald Lobo, MD, Elmer Ramp. Tilden Dome, RN, Corie Chiquito, Technician, Enrigue Catena, CRNA Referring MD:              Medicines:                Monitored Anesthesia Care Complications:            No immediate complications. Estimated Blood Loss:     Estimated blood loss was minimal. Procedure:                Pre-Anesthesia Assessment:                           - Prior to the procedure, a History and Physical                            was performed, and patient medications and                            allergies were reviewed. The patient's tolerance of                            previous anesthesia was also reviewed. The risks                            and benefits of the procedure and the sedation                            options and risks were discussed with the patient.                            All questions were answered, and informed consent                            was obtained. Prior Anticoagulants: The patient has                            taken no previous anticoagulant or antiplatelet  agents. ASA Grade Assessment: III - A patient with                            severe systemic disease. After reviewing the risks                            and benefits, the patient was deemed in                            satisfactory condition to undergo the procedure.                           After obtaining informed consent, the colonoscope                            was passed  under direct vision. Throughout the                            procedure, the patient's blood pressure, pulse, and                            oxygen saturations were monitored continuously. The                            [id number]) Olympus pediatric colonscope                            was introduced through the anus and advanced to the                            the terminal ileum. The colonoscopy was technically                            difficult and complex due to multiple diverticula                            in the colon and restricted mobility of the colon.                            Successful completion of the procedure was aided by                            withdrawing the scope and replacing with the                            ULTRASLIM COLONOSCOPE. The patient tolerated the                            procedure well. The quality of the bowel                            preparation was excellent. Scope In: 10:04:58 AM Scope Out: [id number] Scope Withdrawal Time: 0 hours 12 minutes 44 seconds  Total Procedure Duration: 0 hours 41 minutes 44 seconds  Findings:      The perianal and digital rectal examinations were normal. Pertinent       negatives include normal prostate (size, shape, and consistency).      Multiple medium-mouthed diverticula were found scattered in the entire       colon, with severe sigmoid myochosis and luminal narrowing through which       even the pediatric colonoscope could not pass, necessitating use of the       ultraslim colonoscope which negotiated that area with mild-to-moderate       difficulty.      Evidence of an extrinsic or submucosal mass was found at the appendiceal       orifice, characterized by enlargement, protrusion, and bogginess of the       folds of the appendiceal orifice, but with normal overlying mucosa.       Biopsies were taken with a cold forceps for histology although it is       anticipated those will come back  normal. Estimated blood loss was       minimal.      The terminal ileum appeared normal.      The exam was otherwise without abnormality.      The retroflexed view of the distal rectum and anal verge was normal and       showed no anal or rectal abnormalities. Reinspection of the rectum       showed no additional findings. Impression:               - Diverticulosis in the entire examined colon, with                            associated sigmoid fixation/myochosis/narrowing.                           - Evidence for submucosal or extrinsic process                            (?tumor ?inflammation) at the appendiceal orifice.                            Normal-appearing overlying mucosa, biopsied.                           - No evidence of any primary intraluminal cecal                            process such as colon cancer.                           - The examined portion of the ileum was normal. No                            evidence of Crohn's ileitis.                           - The examination was otherwise normal.                           -  The distal rectum and anal verge are normal on                            retroflexion view. Moderate Sedation:      This patient was sedated with monitored anesthesia care, not moderate       sedation. Recommendation:           - Await pathology results.                           - Repeat colonoscopy is not recommended due to                            current age for screening purposes. Procedure Code(s):        --- Professional ---                           [phone], Colonoscopy, flexible; with biopsy, single                            or multiple Diagnosis Code(s):        --- Professional ---                           D49.0, Neoplasm of unspecified behavior of                            digestive system                           R10.31, Right lower quadrant pain                           K57.30, Diverticulosis of large intestine without                             perforation or abscess without bleeding                           R93.3, Abnormal findings on diagnostic imaging of                            other parts of digestive tract CPT copyright 2019 [employer]. All rights reserved. The codes documented in this report are preliminary and upon coder review may  be revised to meet current compliance requirements. Ronald Lobo, MD 11/02/2019 11:12:11 AM This report has been signed electronically. Number of Addenda: 0

## 2019-11-02 NOTE — Progress Notes (Signed)
Patient's colonoscopy was well-tolerated.  Findings discussed with Dr. Johney Maine.  There does appear to be an extrinsic process in the region of the appendiceal orifice, causing protrusion and bogginess of the folds of the appendiceal orifice, but with normal overlying mucosa.  This could be a mass or an inflammatory process.  The cecum itself did not show any evidence of cancer or intraluminal neoplasia.  The terminal ileum looked normal.  Since the patient has already had a bowel prep, I have placed him on a clear liquid diet for now, in anticipation of the possibility of surgery in the near future.  Please advance diet at your discretion.  I will sign off.  Please call if we can be of further assistance in this patient's care.  Cleotis Nipper, M.D. Pager [phone] If no answer or after 5 PM call [phone]

## 2019-11-02 NOTE — Progress Notes (Signed)
PROGRESS NOTE  Mark Giles [id number] DOB: 12-Sep-1945 DOA: 11/01/2019 PCP: Wenda Low, MD  HPI/Recap of past 25 hours: 74 year old male with past medical history of COPD and marijuana use plus hypothyroidism presented to the emergency room on the early morning of 5/1 with complaints of 1 month history of persistent waxing and waning abdominal pain to the point where he is not really been able to eat, all over, but has recently settled more into the right lower quadrant.  No fevers, change in bowel habits or rectal bleeding or nausea or vomiting or weight loss.  In the emergency room, CT scan noted questionable mass at the base of the cecum perhaps arising from involving the appendix as well as signs of sigmoid diverticulitis.  There is also reports of several months of daily ingestion of marijuana wax.  With these findings, patient admitted to hospitalist service.  General surgery consulted who recommended GI evaluation, given concern over the possibility of a possible GI carcinoma.    Patient taken for colonoscopy in the morning of 5/2.  Looks to be an extrinsic process in the region of the appendiceal orifice causing protrusion and bogginess of the folds but with normal overlying mucosa which could be possible mass or inflammatory process.  Cecum itself did not note any evidence of malignancy or intraluminal neoplasia and terminal ileum looked normal.  Findings discussed with general surgery for decision to be made on exploratory surgery.  Patient himself doing okay with same continued right lower quadrant abdominal pain  Assessment/Plan: Principal Problem:   Right lower quadrant pain with mass of the cecal mesentery seen on CT. CT notes no intraluminal pathology noted, but some extraluminal process either from mass or inflammation.  General surgery to follow-up for possible surgery. Active Problems:   COPD (chronic obstructive pulmonary disease) ([place]): Breathing stable.    Marijuana  abuse   Hypothyroidism: Continue Synthroid    Generalized anxiety disorder: Stable.    Benign prostatic hyperplasia: Stable at this time    Tobacco abuse: Quit smoking 5 years ago.    History of self injury/history of marijuana ingestion/abuse: Monitor closely.  Code Status: Full code  Family Communication: Patient has been speaking with his wife, he tells me that he will let me know if I need to speak with her if she has any questions  Disposition Plan: Dependent on surgery plans   Consultants:  General surgery  GI  Procedures:  Plan colonoscopy 5/2  Antimicrobials:  IV Zosyn 5/1-present  DVT prophylaxis: SCDs   Objective: Vitals:   11/02/19 1130 11/02/19 1149  BP:  (!) 151/79  Pulse: (!) 49 (!) 45  Resp: 12 15  Temp:  (!) 97.4 F (36.3 C)  SpO2: 100% 95%    Intake/Output Summary (Last 24 hours) at 11/02/2019 1354 Last data filed at 11/02/2019 1053 Gross per 24 hour  Intake 4310.51 ml  Output --  Net 4310.51 ml   Filed Weights   11/01/19 0032 11/02/19 0943  Weight: 70.3 kg 70.3 kg   Body mass index is 22.24 kg/m.  Exam:   General: Alert and oriented x3, no acute distress  Cardiovascular: Regular rate and rhythm, S1-S2  Respiratory: Clear to auscultation bilaterally  Abdomen: Soft, distended abdomen, mild tenderness in right lower quadrant, hypoactive bowel sounds  Musculoskeletal: No clubbing or cyanosis or edema  Skin: No skin breaks, tears or lesions  Psychiatry: Appropriate, no evidence of psychoses   Data Reviewed: CBC: Recent Labs  Lab 11/01/19 0130 11/02/19 0451  WBC 8.4 8.5  NEUTROABS 5.3 5.0  HGB 15.4 15.0  HCT 45.8 46.9  MCV 96.0 97.3  PLT 156 [id number]   Basic Metabolic Panel: Recent Labs  Lab 11/01/19 0130 11/02/19 0451  NA 139 140  K 4.1 4.4  CL 105 104  CO2 28 30  GLUCOSE 105* 83  BUN 8 7*  CREATININE 0.77 0.95  CALCIUM 9.3 9.3  MG  --  1.6*   GFR: Estimated Creatinine Clearance: 69.9 mL/min (by C-G  formula based on SCr of 0.95 mg/dL). Liver Function Tests: Recent Labs  Lab 11/01/19 0130 11/02/19 0451  AST 24 19  ALT 22 20  ALKPHOS 45 40  BILITOT 1.0 1.4*  PROT 6.5 6.3*  ALBUMIN 3.9 4.0   Recent Labs  Lab 11/01/19 0130  LIPASE 45   No results for input(s): AMMONIA in the last 168 hours. Coagulation Profile: No results for input(s): INR, PROTIME in the last 168 hours. Cardiac Enzymes: No results for input(s): CKTOTAL, CKMB, CKMBINDEX, TROPONINI in the last 168 hours. BNP (last 3 results) No results for input(s): PROBNP in the last 8760 hours. HbA1C: No results for input(s): HGBA1C in the last 72 hours. CBG: No results for input(s): GLUCAP in the last 168 hours. Lipid Profile: No results for input(s): CHOL, HDL, LDLCALC, TRIG, CHOLHDL, LDLDIRECT in the last 72 hours. Thyroid Function Tests: Recent Labs    11/01/19 0130  TSH 1.320   Anemia Panel: No results for input(s): VITAMINB12, FOLATE, FERRITIN, TIBC, IRON, RETICCTPCT in the last 72 hours. Urine analysis:    Component Value Date/Time   COLORURINE YELLOW 11/01/2019 0049   APPEARANCEUR CLEAR 11/01/2019 0049   LABSPEC 1.017 11/01/2019 0049   PHURINE 5.0 11/01/2019 0049   GLUCOSEU NEGATIVE 11/01/2019 0049   HGBUR NEGATIVE 11/01/2019 0049   BILIRUBINUR NEGATIVE 11/01/2019 0049   KETONESUR NEGATIVE 11/01/2019 0049   PROTEINUR NEGATIVE 11/01/2019 0049   NITRITE NEGATIVE 11/01/2019 0049   LEUKOCYTESUR NEGATIVE 11/01/2019 0049   Sepsis Labs: @LABRCNTIP (procalcitonin:4,lacticidven:4)  ) Recent Results (from the past 240 hour(s))  Respiratory Panel by RT PCR (Flu A&B, Covid) - Nasopharyngeal Swab     Status: None   Collection Time: 11/01/19  3:35 AM   Specimen: Nasopharyngeal Swab  Result Value Ref Range Status   SARS Coronavirus 2 by RT PCR NEGATIVE NEGATIVE Final    Comment: (NOTE) SARS-CoV-2 target nucleic acids are NOT DETECTED. The SARS-CoV-2 RNA is generally detectable in upper respiratoy specimens  during the acute phase of infection. The lowest concentration of SARS-CoV-2 viral copies this assay can detect is 131 copies/mL. A negative result does not preclude SARS-Cov-2 infection and should not be used as the sole basis for treatment or other patient management decisions. A negative result may occur with  improper specimen collection/handling, submission of specimen other than nasopharyngeal swab, presence of viral mutation(s) within the areas targeted by this assay, and inadequate number of viral copies (<131 copies/mL). A negative result must be combined with clinical observations, patient history, and epidemiological information. The expected result is Negative. Fact Sheet for Patients:  PinkCheek.be Fact Sheet for Healthcare Providers:  GravelBags.it This test is not yet ap proved or cleared by the [place] FDA and  has been authorized for detection and/or diagnosis of SARS-CoV-2 by FDA under an Emergency Use Authorization (EUA). This EUA will remain  in effect (meaning this test can be used) for the duration of the COVID-19 declaration under Section 564(b)(1) of the Act, 21 U.S.C. section 360bbb-3(b)(1), unless the authorization  is terminated or revoked sooner.    Influenza A by PCR NEGATIVE NEGATIVE Final   Influenza B by PCR NEGATIVE NEGATIVE Final    Comment: (NOTE) The Xpert Xpress SARS-CoV-2/FLU/RSV assay is intended as an aid in  the diagnosis of influenza from Nasopharyngeal swab specimens and  should not be used as a sole basis for treatment. Nasal washings and  aspirates are unacceptable for Xpert Xpress SARS-CoV-2/FLU/RSV  testing. Fact Sheet for Patients: PinkCheek.be Fact Sheet for Healthcare Providers: GravelBags.it This test is not yet approved or cleared by the [place] FDA and  has been authorized for detection and/or diagnosis of  SARS-CoV-2 by  FDA under an Emergency Use Authorization (EUA). This EUA will remain  in effect (meaning this test can be used) for the duration of the  Covid-19 declaration under Section 564(b)(1) of the Act, 21  U.S.C. section 360bbb-3(b)(1), unless the authorization is  terminated or revoked. Performed at [facility], [place] [address] [place], [place] [address]       Studies: No results found.  Scheduled Meds: . levothyroxine  100 mcg Oral Q0600  . mometasone-formoterol  2 puff Inhalation BID  . pravastatin  20 mg Oral q1800  . tamsulosin  0.4 mg Oral Daily    Continuous Infusions: . ondansetron (ZOFRAN) IV    . piperacillin-tazobactam (ZOSYN)  IV 3.375 g (11/02/19 1234)     LOS: 0 days     Annita Brod, MD [facility]   11/02/2019, 1:54 PM

## 2019-11-02 NOTE — Interval H&P Note (Signed)
History and Physical Interval Note:  11/02/2019 9:56 AM  [name]  has presented today for surgery, with the diagnosis of Abnormal CT of colon.  The various methods of treatment have been discussed with the patient and family. After consideration of risks, benefits and other options for treatment, the patient has consented to  Procedure(s): COLONOSCOPY WITH PROPOFOL (N/A) as a surgical intervention.  The patient's history has been reviewed, patient examined, no change in status, stable for surgery.  I have reviewed the patient's chart and labs.  Questions were answered to the patient's satisfaction.     Mark Giles

## 2019-11-03 ENCOUNTER — Encounter: Payer: Self-pay | Admitting: *Deleted

## 2019-11-03 DIAGNOSIS — F1210 Cannabis abuse, uncomplicated: Secondary | ICD-10-CM

## 2019-11-03 DIAGNOSIS — N400 Enlarged prostate without lower urinary tract symptoms: Secondary | ICD-10-CM | POA: Diagnosis not present

## 2019-11-03 DIAGNOSIS — R1031 Right lower quadrant pain: Secondary | ICD-10-CM

## 2019-11-03 DIAGNOSIS — E039 Hypothyroidism, unspecified: Secondary | ICD-10-CM

## 2019-11-03 DIAGNOSIS — K6389 Other specified diseases of intestine: Principal | ICD-10-CM

## 2019-11-03 DIAGNOSIS — K56609 Unspecified intestinal obstruction, unspecified as to partial versus complete obstruction: Secondary | ICD-10-CM

## 2019-11-03 DIAGNOSIS — J449 Chronic obstructive pulmonary disease, unspecified: Secondary | ICD-10-CM | POA: Diagnosis not present

## 2019-11-03 DIAGNOSIS — F411 Generalized anxiety disorder: Secondary | ICD-10-CM

## 2019-11-03 DIAGNOSIS — Z915 Personal history of self-harm: Secondary | ICD-10-CM

## 2019-11-03 DIAGNOSIS — Z720 Tobacco use: Secondary | ICD-10-CM

## 2019-11-03 MED ORDER — OXYCODONE-ACETAMINOPHEN 7.5-325 MG PO TABS: 1.0000 | ORAL_TABLET | Freq: Four times a day (QID) | ORAL | 0 refills | Status: AC | PRN

## 2019-11-03 MED ORDER — SENNA 8.6 MG PO TABS
2.0000 | ORAL_TABLET | Freq: Every day | ORAL | 0 refills | Status: DC
Start: 2019-11-03 — End: 2020-09-17

## 2019-11-03 MED ORDER — ONDANSETRON HCL 4 MG PO TABS: 4.0000 mg | ORAL_TABLET | Freq: Every day | ORAL | 0 refills | Status: AC | PRN

## 2019-11-03 MED ORDER — FENTANYL 25 MCG/HR TD PT72: 1.0000 | MEDICATED_PATCH | TRANSDERMAL | 0 refills | Status: AC

## 2019-11-03 NOTE — Progress Notes (Signed)
1 Day Post-Op   Subjective/Chief Complaint: Pt sitting up in chair.  Having some right lower quadrant pain.  Colonoscopy showed no significant intraluminal mass but there was some concern about external compression around the ileocecal valve or appendix.  CT scan reviewed today with Dr. Johney Maine as well as reviewing the colonoscopy performed by Dr. Cristina Gong.  Concern for carcinomatosis from this mass with differential diagnosis being appendiceal carcinoma, carcinoid tumor less likely lymphoma or small bowel adenocarcinoma.  Consensus was referral to [facility] to see a surgical oncologist for consideration of surgical debulking/resection/intraperitoneal chemotherapy.  This appears relatively early in the process and a good time for this referral to be made.  The services are not available in [place] which I explained to the patient.   Objective: Vital signs in last 24 hours: Temp:  [97.4 F (36.3 C)-98 F (36.7 C)] 97.6 F (36.4 C) (05/03 0529) Pulse Rate:  [42-81] 72 (05/03 0617) Resp:  [10-23] 16 (05/03 0529) BP: (103-151)/(55-91) 113/59 (05/03 0529) SpO2:  [84 %-100 %] 92 % (05/03 0617) Weight:  [70.3 kg] 70.3 kg (05/02 0943) Last BM Date: 11/02/19  Intake/Output from previous day: 05/02 0701 - 05/03 0700 In: 1631.8 [P.O.:480; I.V.:1000; IV Piggyback:151.8] Out: -  Intake/Output this shift: No intake/output data recorded.  GI: Midline laparotomy scar.  Nondistended.  No peritonitis.  No ascites.  Tenderness right lower quadrant to palpation.  Lab Results:  Recent Labs    11/01/19 0130 11/02/19 0451  WBC 8.4 8.5  HGB 15.4 15.0  HCT 45.8 46.9  PLT 156 200   BMET Recent Labs    11/01/19 0130 11/02/19 0451  NA 139 140  K 4.1 4.4  CL 105 104  CO2 28 30  GLUCOSE 105* 83  BUN 8 7*  CREATININE 0.77 0.95  CALCIUM 9.3 9.3   PT/INR No results for input(s): LABPROT, INR in the last 72 hours. ABG No results for input(s): PHART, HCO3 in  the last 72 hours.  Invalid input(s): PCO2, PO2  Studies/Results: No results found.  Anti-infectives: Anti-infectives (From admission, onward)   Start     Dose/Rate Route Frequency Ordered Stop   11/03/19 0000  piperacillin-tazobactam (ZOSYN) IVPB 3.375 g     3.375 g 12.5 mL/hr over 240 Minutes Intravenous Every 8 hours 11/02/19 2359     11/02/19 1000  piperacillin-tazobactam (ZOSYN) IVPB 3.375 g  Status:  Discontinued     3.375 g 12.5 mL/hr over 240 Minutes Intravenous Every 8 hours 11/02/19 0710 11/02/19 2359   11/01/19 0930  piperacillin-tazobactam (ZOSYN) IVPB 3.375 g  Status:  Discontinued     3.375 g 100 mL/hr over 30 Minutes Intravenous Every 6 hours 11/01/19 0540 11/02/19 0710   11/01/19 0330  piperacillin-tazobactam (ZOSYN) IVPB 3.375 g     3.375 g 100 mL/hr over 30 Minutes Intravenous  Once 11/01/19 0329 11/01/19 0440      Assessment/Plan: Mesenteric mass in the ileocecal region unknown etiology but concerning for carcinomatosis with potential peritoneal nodules noted on admission CT scan.  Colonoscopy otherwise unremarkable except for external compression.  Long discussion with patient about condition.  Concern for carcinomatosis therefore referral to a tertiary care center with expertise in his condition is  my recommendation.  This can be done as an outpatient and I will have our office begin that referral process.  The patient may be discharged home today.  The biggest issue will be pain management:  he may benefit from transdermal fentanyl patches for now  until further work-up can be complete.  Recommend boost shakes 3 times daily to improve nutrition prior to any treatments or surgery.  He has no signs of obstruction at this point time.   LOS: 1 day    Joyice Faster Lanelle Lindo 11/03/2019

## 2019-11-03 NOTE — Discharge Summary (Addendum)
Physician Discharge Summary  Mark Giles [id number] DOB: Nov 03, 1945 DOA: 11/01/2019  PCP: Wenda Low, MD  Admit date: 11/01/2019 Discharge date: 11/03/2019  Admitted From: Home  Discharge disposition: Home   Recommendations for Outpatient Follow-Up:   Follow up with your primary care provider in one week.  Check CBC, BMP in the next visit Follow-up with general surgery at [facility] as scheduled by the surgery clinic.   Discharge Diagnosis:   Principal Problem:   Right lower quadrant pain Active Problems:   COPD (chronic obstructive pulmonary disease) (HCC)   Marijuana abuse   Hypothyroidism   Generalized anxiety disorder   Benign prostatic hyperplasia   Mass of cecal mesentery   Tobacco abuse   History of self injury   Colonic obstruction ([place]) Sigmoid diverticulitis  Discharge Condition: Improved.  Diet recommendation: Low sodium, heart healthy.    Wound care: None.  Code status: Full.   History of Present Illness:   74 year old male with past medical history of COPD and marijuana use plus hypothyroidism presented to the emergency room on the early morning of 5/1 with complaints of 1 month history of persistent waxing and waning abdominal pain to the point where he is not really been able to eat.  Pain had largely settled more into the right lower quadrant.   In the emergency room, CT scan noted questionable mass at the base of the cecum perhaps arising from involving the appendix as well as signs of sigmoid diverticulitis.  There is also reports of several months of daily ingestion of marijuana wax.  With these findings, patient admitted to hospitalist service.  General surgery consulted who recommended GI evaluation, given concern over the possibility of a possible GI carcinoma.   Patient taken for colonoscopy in the morning of 5/2.    There was concern for an extrinsic process in the region of the appendiceal orifice causing protrusion and bogginess of the  folds but with normal overlying mucosa which could be possible mass or inflammatory process.  Cecum itself did not note any evidence of malignancy or intraluminal neoplasia and terminal ileum looked normal.  Surgery was then consulted who recommended consultation at higher facility in [facility] for this.  Hospital Course:   Following conditions were addressed during hospitalization as listed below,  Right lower quadrant pain with mass of the cecal mesentery seen on CT. CT without intraluminal pathology noted, but some extraluminal process either from mass or inflammation.    GI and general surgery followed the patient.  At this time the plan is to follow-up at [facility] for expert care.  General surgery to arrange this as outpatient.  Patient will be prescribed fentanyl patch and oxycodone for intractable pain.  He was advised to follow-up with his primary care provider for ongoing pain management.  Sigmoid diverticulitis. Ruled in.   Chronic obstructive pulmonary disease.  Remained compensated     Hypothyroidism: Continue Synthroid     Generalized anxiety disorder: Stable.     Benign prostatic hyperplasia: Stable at this time     Tobacco abuse: Quit smoking 5 years ago.   Disposition.  At this time, patient is stable for disposition home.  Patient will have to follow-up with his primary care physician and surgery as outpatient.  Medical Consultants:   General surgery GI  Procedures:    None Subjective:   Today, patient complains of mild abdominal pain.  No nausea or vomiting.  Denies shortness of breath, fever or chills.  Discharge Exam:   Vitals:   11/03/19 0529 11/03/19 0617  BP: (!) 113/59   Pulse: 70 72  Resp: 16   Temp: 97.6 F (36.4 C)   SpO2: (!) 84% 92%   Vitals:   11/02/19 2349 11/03/19 0000 11/03/19 0529 11/03/19 0617  BP: 111/64  (!) 113/59   Pulse: 74 73 70 72  Resp: 16  16   Temp: 98 F (36.7 C)  97.6 F (36.4 C)     TempSrc: Oral  Oral   SpO2: (!) 86% 100% (!) 84% 92%  Weight:      Height:        General: Alert awake, not in obvious distress HENT: pupils equally reacting to light,  No scleral pallor or icterus noted. Oral mucosa is moist.  Chest:  Clear breath sounds.  Diminished breath sounds bilaterally. No crackles or wheezes.  CVS: S1 &S2 heard. No murmur.  Regular rate and rhythm. Abdomen: Soft, nontender, mildly distended abdomen, mild tenderness over the right lower quadrant, bowel sounds are heard  Extremities: No cyanosis, clubbing or edema.  Peripheral pulses are palpable. Psych: Alert, awake and oriented, normal mood CNS:  No cranial nerve deficits.  Power equal in all extremities.   Skin: Warm and dry.  No rashes noted.  The results of significant diagnostics from this hospitalization (including imaging, microbiology, ancillary and laboratory) are listed below for reference.     Diagnostic Studies:   CT ABDOMEN PELVIS W CONTRAST  Addendum Date: 11/01/2019   ADDENDUM REPORT: 11/01/2019 05:51 ADDENDUM: The surgeon Dr. Alwyn Pea called for a second opinion on this case at 4:30am on 11/01/2019. There is a hyperenhancing poorly marginated 3.0 x 2.4 cm mass in the right abdomen at the base of the cecum (series 2/image 41), highly suspicious for primary colonic (cecal) or appendiceal malignancy. The appendix is mildly dilated (1.1 cm diameter) without significant appendiceal wall thickening or periappendiceal fat stranding. There is irregular peritoneal thickening and enhancement throughout the anterior pelvis (series 2/image 61) and along the left pelvic sidewall (series 2/image 57). There is an irregular enhancing 1.0 cm soft tissue implant in the anterior right pelvic peritoneum (series 2/image 58). Several tiny soft tissue implants are noted throughout the right paracolic gutter, largest 0.6 cm (series 2/image 41). 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 There is  trace perihepatic and deep pelvic ascites. These addended results were called by telephone at the time of interpretation on 11/01/2019 at 4:30 am to provider DR. STEPHEN GROSS, who verbally acknowledged these results. Electronically Signed   By: Ilona Sorrel M.D.   On: 11/01/2019 05:51   Addendum Date: 11/01/2019   ADDENDUM REPORT: 11/01/2019 03:23 ADDENDUM: Upon further review there appears to be a rounded hyperdense masslike area within the right lower quadrant likely within the distal cecum, series 2, image 42 extending from this area is the appendix which appears to be mildly dilated measuring up to 1.1 cm. There is mild fat stranding changes seen around the appendix. No surrounding fluid collections or free air is seen. This could represent a possible focus of vascular malformation/angio dysplasia within the cecum and early mild appendicitis. No periappendiceal abscess or evidence of perforation. These results were called by telephone at the time of interpretation on 11/01/2019 at 3:23 am to provider [facility] , who verbally acknowledged these results. Electronically Signed   By: Prudencio Pair M.D.   On: 11/01/2019 03:23   Result Date: 11/01/2019 CLINICAL DATA:  Right-sided flank pain EXAM:  CT ABDOMEN AND PELVIS WITH CONTRAST TECHNIQUE: Multidetector CT imaging of the abdomen and pelvis was performed using the standard protocol following bolus administration of intravenous contrast. CONTRAST:  191mL OMNIPAQUE IOHEXOL 300 MG/ML  SOLN COMPARISON:  None. FINDINGS: Lower chest: The visualized heart size within normal limits. No pericardial fluid/thickening. No hiatal hernia. Bilateral scarring seen at both lung bases. Hepatobiliary: The liver is normal in density without focal abnormality.The main portal vein is patent. No evidence of calcified gallstones, gallbladder wall thickening or biliary dilatation. Pancreas: Unremarkable. No pancreatic ductal dilatation or surrounding inflammatory changes. Spleen: Normal in  size without focal abnormality. Adrenals/Urinary Tract: Both adrenal glands appear normal. The right kidney is unremarkable. 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 No hydronephrosis is seen. The bladder is unremarkable. Stomach/Bowel: The stomach and small bowel are unremarkable. There is scattered colonic diverticula noted. There is diffuse wall thickening seen within a 10 cm segment of sigmoid colon with mild surrounding fat stranding changes. No loculated fluid collections however are seen. No surrounding free air. Vascular/Lymphatic: There are no enlarged mesenteric, retroperitoneal, or pelvic lymph nodes. Scattered aortic atherosclerotic calcifications are seen without aneurysmal dilatation. Reproductive: The prostate is unremarkable. Other: No evidence of abdominal wall mass or hernia. Musculoskeletal: There is a mild levoconvex scoliotic curvature of the lumbar spine. Posterior spinal fixation seen at L4-L5. IMPRESSION: 1. Findings suggestive of acute mild sigmoid colonic diverticulitis. No surrounding pericolonic fluid collections or free air. 2.  Aortic Atherosclerosis (ICD10-I70.0). Electronically Signed: By: Prudencio Pair M.D. On: 11/01/2019 02:48     Labs:   Basic Metabolic Panel: Recent Labs  Lab 11/01/19 0130 11/02/19 0451  NA 139 140  K 4.1 4.4  CL 105 104  CO2 28 30  GLUCOSE 105* 83  BUN 8 7*  CREATININE 0.77 0.95  CALCIUM 9.3 9.3  MG  --  1.6*   GFR Estimated Creatinine Clearance: 69.9 mL/min (by C-G formula based on SCr of 0.95 mg/dL). Liver Function Tests: Recent Labs  Lab 11/01/19 0130 11/02/19 0451  AST 24 19  ALT 22 20  ALKPHOS 45 40  BILITOT 1.0 1.4*  PROT 6.5 6.3*  ALBUMIN 3.9 4.0   Recent Labs  Lab 11/01/19 0130  LIPASE 45   No results for input(s): AMMONIA in the last 168 hours. Coagulation profile No results for input(s): INR, PROTIME in the last 168  hours.  CBC: Recent Labs  Lab 11/01/19 0130 11/02/19 0451  WBC 8.4 8.5  NEUTROABS 5.3 5.0  HGB 15.4 15.0  HCT 45.8 46.9  MCV 96.0 97.3  PLT 156 200   Cardiac Enzymes: No results for input(s): CKTOTAL, CKMB, CKMBINDEX, TROPONINI in the last 168 hours. BNP: Invalid input(s): POCBNP CBG: No results for input(s): GLUCAP in the last 168 hours. D-Dimer No results for input(s): DDIMER in the last 72 hours. Hgb A1c No results for input(s): HGBA1C in the last 72 hours. Lipid Profile No results for input(s): CHOL, HDL, LDLCALC, TRIG, CHOLHDL, LDLDIRECT in the last 72 hours. Thyroid function studies Recent Labs    11/01/19 0130  TSH 1.320   Anemia work up No results for input(s): VITAMINB12, FOLATE, FERRITIN, TIBC, IRON, RETICCTPCT in the last 72 hours. Microbiology Recent Results (from the past 240 hour(s))  Respiratory Panel by RT PCR (Flu A&B, Covid) - Nasopharyngeal Swab     Status: None   Collection Time: 11/01/19  3:35 AM   Specimen:  Nasopharyngeal Swab  Result Value Ref Range Status   SARS Coronavirus 2 by RT PCR NEGATIVE NEGATIVE Final    Comment: (NOTE) SARS-CoV-2 target nucleic acids are NOT DETECTED. The SARS-CoV-2 RNA is generally detectable in upper respiratoy specimens during the acute phase of infection. The lowest concentration of SARS-CoV-2 viral copies this assay can detect is 131 copies/mL. A negative result does not preclude SARS-Cov-2 infection and should not be used as the sole basis for treatment or other patient management decisions. A negative result may occur with  improper specimen collection/handling, submission of specimen other than nasopharyngeal swab, presence of viral mutation(s) within the areas targeted by this assay, and inadequate number of viral copies (<131 copies/mL). A negative result must be combined with clinical observations, patient history, and epidemiological information. The expected result is Negative. Fact Sheet for  Patients:  PinkCheek.be Fact Sheet for Healthcare Providers:  GravelBags.it This test is not yet ap proved or cleared by the [place] FDA and  has been authorized for detection and/or diagnosis of SARS-CoV-2 by FDA under an Emergency Use Authorization (EUA). This EUA will remain  in effect (meaning this test can be used) for the duration of the COVID-19 declaration under Section 564(b)(1) of the Act, 21 U.S.C. section 360bbb-3(b)(1), unless the authorization is terminated or revoked sooner.    Influenza A by PCR NEGATIVE NEGATIVE Final   Influenza B by PCR NEGATIVE NEGATIVE Final    Comment: (NOTE) The Xpert Xpress SARS-CoV-2/FLU/RSV assay is intended as an aid in  the diagnosis of influenza from Nasopharyngeal swab specimens and  should not be used as a sole basis for treatment. Nasal washings and  aspirates are unacceptable for Xpert Xpress SARS-CoV-2/FLU/RSV  testing. Fact Sheet for Patients: PinkCheek.be Fact Sheet for Healthcare Providers: GravelBags.it This test is not yet approved or cleared by the [place] FDA and  has been authorized for detection and/or diagnosis of SARS-CoV-2 by  FDA under an Emergency Use Authorization (EUA). This EUA will remain  in effect (meaning this test can be used) for the duration of the  Covid-19 declaration under Section 564(b)(1) of the Act, 21  U.S.C. section 360bbb-3(b)(1), unless the authorization is  terminated or revoked. Performed at [facility], [place] [address] [place], [place] [address]      Discharge Instructions:   Discharge Instructions     Diet - low sodium heart healthy   Complete by: As directed    Discharge instructions   Complete by: As directed    Take fentanyl patch and oxycodone as prescribed. Please follow up with your primary care within 7 days and discuss about pain  medications and follow up. Surgery to arrange for referral to [facility] for further care.   Increase activity slowly   Complete by: As directed       Allergies as of 11/03/2019   No Known Allergies      Medication List     TAKE these medications    albuterol 108 (90 Base) MCG/ACT inhaler Commonly known as: VENTOLIN HFA Inhale 2 puffs into the lungs every 4 (four) hours as needed for wheezing or shortness of breath.   buPROPion 300 MG 24 hr tablet Commonly known as: WELLBUTRIN XL Take 300 mg by mouth every evening.   escitalopram 10 MG tablet Commonly known as: LEXAPRO Take 10 mg by mouth at bedtime.   fentaNYL 25 MCG/HR Commonly known as: [place] 1 patch onto the skin every 3 (three) days for 9  days.   Fluticasone-Salmeterol 113-14 MCG/ACT Aepb INHALE 2 PUFFS BY MOUTH INTO THE LUNGS TWICE DAILY What changed: See the new instructions.   levothyroxine 100 MCG tablet Commonly known as: SYNTHROID Take 100 mcg by mouth every evening.   lovastatin 20 MG tablet Commonly known as: MEVACOR Take 20 mg by mouth at bedtime.   ondansetron 4 MG tablet Commonly known as: Zofran Take 1 tablet (4 mg total) by mouth daily as needed for nausea or vomiting.   oxyCODONE-acetaminophen 7.5-325 MG tablet Commonly known as: Percocet Take 1 tablet by mouth every 6 (six) hours as needed for up to 5 days for severe pain.   senna 8.6 MG Tabs tablet Commonly known as: SENOKOT Take 2 tablets (17.2 mg total) by mouth at bedtime.   tamsulosin 0.4 MG Caps capsule Commonly known as: FLOMAX Take 0.4 mg by mouth daily after supper.   traZODone 50 MG tablet Commonly known as: DESYREL Take 50 mg by mouth at bedtime.           Time coordinating discharge: 39 minutes  Signed:  Nioma Mccubbins  [facility] 11/03/2019, 10:17 AM

## 2019-11-03 NOTE — Plan of Care (Signed)
Discussed with patient plan of care for the evening, pain management and antibiotics with some teach back displayed 

## 2019-11-04 LAB — SURGICAL PATHOLOGY

## 2019-11-10 DIAGNOSIS — R1031 Right lower quadrant pain: Secondary | ICD-10-CM | POA: Diagnosis not present

## 2019-11-10 DIAGNOSIS — Z09 Encounter for follow-up examination after completed treatment for conditions other than malignant neoplasm: Secondary | ICD-10-CM | POA: Diagnosis not present

## 2019-11-10 DIAGNOSIS — R1903 Right lower quadrant abdominal swelling, mass and lump: Secondary | ICD-10-CM | POA: Diagnosis not present

## 2019-11-11 ENCOUNTER — Institutional Professional Consult (permissible substitution): Payer: Medicare Other | Admitting: Internal Medicine

## 2019-11-13 DIAGNOSIS — K6389 Other specified diseases of intestine: Secondary | ICD-10-CM | POA: Diagnosis not present

## 2019-11-19 DIAGNOSIS — D378 Neoplasm of uncertain behavior of other specified digestive organs: Secondary | ICD-10-CM | POA: Diagnosis not present

## 2019-11-19 DIAGNOSIS — K6389 Other specified diseases of intestine: Secondary | ICD-10-CM | POA: Diagnosis not present

## 2019-11-19 DIAGNOSIS — R799 Abnormal finding of blood chemistry, unspecified: Secondary | ICD-10-CM | POA: Diagnosis not present

## 2019-11-19 DIAGNOSIS — R1909 Other intra-abdominal and pelvic swelling, mass and lump: Secondary | ICD-10-CM | POA: Diagnosis not present

## 2019-11-19 DIAGNOSIS — Z9889 Other specified postprocedural states: Secondary | ICD-10-CM | POA: Diagnosis not present

## 2019-11-19 DIAGNOSIS — C7A021 Malignant carcinoid tumor of the cecum: Secondary | ICD-10-CM | POA: Diagnosis not present

## 2019-11-19 DIAGNOSIS — C181 Malignant neoplasm of appendix: Secondary | ICD-10-CM | POA: Diagnosis not present

## 2019-11-19 DIAGNOSIS — G893 Neoplasm related pain (acute) (chronic): Secondary | ICD-10-CM | POA: Diagnosis not present

## 2019-11-19 DIAGNOSIS — Z87891 Personal history of nicotine dependence: Secondary | ICD-10-CM | POA: Diagnosis not present

## 2019-11-19 DIAGNOSIS — D373 Neoplasm of uncertain behavior of appendix: Secondary | ICD-10-CM | POA: Diagnosis not present

## 2019-11-19 DIAGNOSIS — I7090 Unspecified atherosclerosis: Secondary | ICD-10-CM | POA: Diagnosis not present

## 2019-12-02 DIAGNOSIS — C181 Malignant neoplasm of appendix: Secondary | ICD-10-CM | POA: Diagnosis not present

## 2019-12-02 DIAGNOSIS — C786 Secondary malignant neoplasm of retroperitoneum and peritoneum: Secondary | ICD-10-CM | POA: Diagnosis not present

## 2019-12-02 DIAGNOSIS — R188 Other ascites: Secondary | ICD-10-CM | POA: Diagnosis not present

## 2019-12-02 DIAGNOSIS — D484 Neoplasm of uncertain behavior of peritoneum: Secondary | ICD-10-CM | POA: Diagnosis not present

## 2019-12-08 DIAGNOSIS — D373 Neoplasm of uncertain behavior of appendix: Secondary | ICD-10-CM | POA: Diagnosis not present

## 2019-12-08 DIAGNOSIS — R978 Other abnormal tumor markers: Secondary | ICD-10-CM | POA: Diagnosis not present

## 2019-12-08 DIAGNOSIS — C7A8 Other malignant neuroendocrine tumors: Secondary | ICD-10-CM | POA: Diagnosis not present

## 2019-12-08 DIAGNOSIS — Z87891 Personal history of nicotine dependence: Secondary | ICD-10-CM | POA: Diagnosis not present

## 2019-12-08 DIAGNOSIS — Z79899 Other long term (current) drug therapy: Secondary | ICD-10-CM | POA: Diagnosis not present

## 2019-12-08 DIAGNOSIS — C20 Malignant neoplasm of rectum: Secondary | ICD-10-CM | POA: Diagnosis not present

## 2019-12-15 ENCOUNTER — Ambulatory Visit (INDEPENDENT_AMBULATORY_CARE_PROVIDER_SITE_OTHER): Payer: Medicare Other | Admitting: Internal Medicine

## 2019-12-15 ENCOUNTER — Encounter: Payer: Self-pay | Admitting: Internal Medicine

## 2019-12-15 VITALS — BP 110/70 | HR 83 | Temp 98.0°F | Ht 69.0 in | Wt 156.2 lb

## 2019-12-15 DIAGNOSIS — D7219 Other eosinophilia: Secondary | ICD-10-CM

## 2019-12-15 MED ORDER — CETIRIZINE HCL 10 MG PO TABS: 10.0000 mg | ORAL_TABLET | Freq: Every day | ORAL | 11 refills | Status: DC

## 2019-12-15 MED ORDER — SPIRIVA RESPIMAT 2.5 MCG/ACT IN AERS
2.0000 | INHALATION_SPRAY | Freq: Every day | RESPIRATORY_TRACT | 0 refills | Status: DC
Start: 2019-12-15 — End: 2020-09-17

## 2019-12-15 MED ORDER — OMEPRAZOLE 20 MG PO CPDR: 20.0000 mg | DELAYED_RELEASE_CAPSULE | Freq: Every day | ORAL | 11 refills | Status: DC

## 2019-12-15 MED ORDER — AIRDUO DIGIHALER 232-14 MCG/ACT IN AEPB: 1.0000 | INHALATION_SPRAY | Freq: Two times a day (BID) | RESPIRATORY_TRACT | 11 refills | Status: DC

## 2019-12-15 MED ORDER — MONTELUKAST SODIUM 10 MG PO TABS: 10.0000 mg | ORAL_TABLET | Freq: Every day | ORAL | 11 refills | Status: AC

## 2019-12-15 NOTE — Patient Instructions (Signed)
It was nice to meet you Your lung function test and eosinophil counts indicate you may have allergic bronchitis on top of COPD  - Start zyrtec, singulair, omeprazole - Increase dose of home inhaler - Try spiriva, let me know if helps - Get blood work done - Talk to your oncologist about your right shoulder pain, I think it is coming from your tumor - I will call you if I see any lung problems on your scan next week - Follow up in 1 month to make sure you are feeling better
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
## 2019-12-16 LAB — IGE: IgE (Immunoglobulin E), Serum: <2 kU/L (ref <=114)

## 2020-01-16 DIAGNOSIS — G4700 Insomnia, unspecified: Secondary | ICD-10-CM | POA: Diagnosis not present

## 2020-01-21 ENCOUNTER — Ambulatory Visit: Payer: Medicare Other | Admitting: Internal Medicine

## 2020-01-21 ENCOUNTER — Other Ambulatory Visit: Payer: Self-pay

## 2020-01-21 ENCOUNTER — Ambulatory Visit (INDEPENDENT_AMBULATORY_CARE_PROVIDER_SITE_OTHER): Payer: Medicare Other | Admitting: Primary Care

## 2020-01-21 ENCOUNTER — Encounter: Payer: Self-pay | Admitting: Primary Care

## 2020-01-21 DIAGNOSIS — J449 Chronic obstructive pulmonary disease, unspecified: Secondary | ICD-10-CM | POA: Diagnosis not present

## 2020-01-21 NOTE — Progress Notes (Signed)
@Patient  ID: Mark Giles, male    DOB: 1946-02-27, 74 y.o.   MRN: [id number]  Chief Complaint  Patient presents with  . Follow-up    Referring provider: London Pepper, MD  HPI 74 year old male, former smoker quit in 2015 (90-pack-year history).  Past medical history significant for COPD Gold 3.  Patient of Dr. Tamala Julian, last seen 12/15/2019. PFTs 05/06/12  FEV1  1.87 (55%) ratio 45 and dlco 43. Trial of anoro 09/01/2014 > Changed to airduo 01/06/2016 as could not afford anoro cash basis   01/21/2020 Patient presents today for 1 month follow-up.  He reports moderate dyspnea which is worse with heat and humidity.  He has been compliant with fluticasone-salmeterol one puff twice a day. He did not notice any improvement with sample of Spiriva respimat after 2 week and stopped this. He has not needed to use his rescue inhaler. He is only taking Singulair and Zyrtec as needed. He is not looking to take more medications.  Pulmonary function testing:  PFT's  03/17/2016  FEV1 1.44 (44 % ) ratio 50  p 6 % improvement from saba p nothing prior to study with DLCO  44 % corrects to 34  % for alv volume    No Known Allergies  Immunization History  Administered Date(s) Administered  . Influenza Split 04/02/2014  . Influenza,inj,quad, With Preservative 04/11/2019  . PFIZER SARS-COV-2 Vaccination 07/25/2019, 08/15/2019  . Pneumococcal Conjugate-13 07/22/2013    Past Medical History:  Diagnosis Date  . Anxiety   . Chronic back pain    History of   . COPD (chronic obstructive pulmonary disease) ([place])   . Dyslipidemia   . H/O: depression   . Hyperlipidemia   . Inguinal hernia without mention of obstruction or gangrene, unilateral or unspecified, (not specified as recurrent) 2013  . Tobacco abuse     Tobacco History: Social History   Tobacco Use  Smoking Status Former Smoker  . Packs/day: 3.00  . Years: 30.00  . Pack years: 90.00  . Types: Cigarettes, E-cigarettes  . Quit date: 01/31/2014    . Years since quitting: 5.9  Smokeless Tobacco Never Used  Tobacco Comment   uses e-cig   Counseling given: Not Answered Comment: uses e-cig   Outpatient Medications Prior to Visit  Medication Sig Dispense Refill  . albuterol (VENTOLIN HFA) 108 (90 Base) MCG/ACT inhaler Inhale 2 puffs into the lungs every 4 (four) hours as needed for wheezing or shortness of breath.     Marland Kitchen buPROPion (WELLBUTRIN XL) 300 MG 24 hr tablet Take 300 mg by mouth every evening.     . cetirizine (ZYRTEC ALLERGY) 10 MG tablet Take 1 tablet (10 mg total) by mouth at bedtime. 30 tablet 11  . escitalopram (LEXAPRO) 10 MG tablet Take 10 mg by mouth at bedtime.     . Fluticasone-Salmeterol,sensor, (AIRDUO DIGIHALER) 232-14 MCG/ACT AEPB Inhale 1 Inhaler into the lungs in the morning and at bedtime. 1 each 11  . levothyroxine (SYNTHROID, LEVOTHROID) 100 MCG tablet Take 100 mcg by mouth every evening.     . lovastatin (MEVACOR) 20 MG tablet Take 20 mg by mouth at bedtime.     . montelukast (SINGULAIR) 10 MG tablet Take 1 tablet (10 mg total) by mouth at bedtime. 30 tablet 11  . omeprazole (PRILOSEC) 20 MG capsule Take 1 capsule (20 mg total) by mouth daily. 30 capsule 11  . ondansetron (ZOFRAN) 4 MG tablet Take 1 tablet (4 mg total) by mouth daily as needed for  nausea or vomiting. 30 tablet 0  . senna (SENOKOT) 8.6 MG TABS tablet Take 2 tablets (17.2 mg total) by mouth at bedtime. 120 tablet 0  . tamsulosin (FLOMAX) 0.4 MG CAPS capsule Take 0.4 mg by mouth daily after supper.     . Tiotropium Bromide Monohydrate (SPIRIVA RESPIMAT) 2.5 MCG/ACT AERS Inhale 2 puffs into the lungs daily. 4 g 0  . traZODone (DESYREL) 50 MG tablet Take 50 mg by mouth at bedtime.     No facility-administered medications prior to visit.   Review of Systems  Review of Systems  Constitutional: Negative.   HENT: Negative.   Respiratory: Positive for cough and shortness of breath. Negative for chest tightness and wheezing.   Cardiovascular:  Negative.     Physical Exam  BP 138/86 (BP Location: Left Arm, Cuff Size: Normal)   Pulse 61   Temp 98 F (36.7 C) (Oral)   Ht 5\' 10"  (1.778 m)   Wt 160 lb 9.6 oz (72.8 kg)   SpO2 97%   BMI 23.04 kg/m  Physical Exam Constitutional:      Appearance: Normal appearance.  HENT:     Mouth/Throat:     Mouth: Mucous membranes are moist.     Pharynx: Oropharynx is clear.  Cardiovascular:     Rate and Rhythm: Normal rate and regular rhythm.  Pulmonary:     Effort: Pulmonary effort is normal.     Breath sounds: Normal breath sounds.  Musculoskeletal:        General: Normal range of motion.  Neurological:     General: No focal deficit present.     Mental Status: He is alert and oriented to person, place, and time. Mental status is at baseline.  Psychiatric:        Mood and Affect: Mood normal.        Behavior: Behavior normal.        Thought Content: Thought content normal.        Judgment: Judgment normal.      Lab Results:  CBC    Component Value Date/Time   WBC 8.5 11/02/2019 0451   RBC 4.82 11/02/2019 0451   HGB 15.0 11/02/2019 0451   HCT 46.9 11/02/2019 0451   PLT 200 11/02/2019 0451   MCV 97.3 11/02/2019 0451   MCH 31.1 11/02/2019 0451   MCHC 32.0 11/02/2019 0451   RDW 13.3 11/02/2019 0451   LYMPHSABS 1.6 11/02/2019 0451   MONOABS 1.1 (H) 11/02/2019 0451   EOSABS 0.7 (H) 11/02/2019 0451   BASOSABS 0.1 11/02/2019 0451    BMET    Component Value Date/Time   NA 140 11/02/2019 0451   K 4.4 11/02/2019 0451   CL 104 11/02/2019 0451   CO2 30 11/02/2019 0451   GLUCOSE 83 11/02/2019 0451   BUN 7 (L) 11/02/2019 0451   CREATININE 0.95 11/02/2019 0451   CALCIUM 9.3 11/02/2019 0451   GFRNONAA >60 11/02/2019 0451   GFRAA >60 11/02/2019 0451    BNP No results found for: BNP  ProBNP No results found for: PROBNP  Imaging: No results found.   Assessment & Plan:   COPD (chronic obstructive pulmonary disease) (HCC) - COPD/asthma overlap. FEV1 1.44 (44%),  ratio 50; Eosinophils 700 - Compliance with medication seems to be spotty. No benefit from Spiriva Respimat patient stopped - Continue Fluticasone-Salmeterol one puff twice daily and Albuterol hfa every 4-6 hours as needed for breakthrough shortness of breath or wheezing  - Recommend patient take Singulair contistently at bedtime  -  Continue Zyrtec as needed for allergy symptoms  - Follow-up As needed basis with pulmonary    Martyn Ehrich, NP 01/21/2020

## 2020-01-21 NOTE — Patient Instructions (Addendum)
Recommendations: Continue Fluticasone-Salmeterol one puff daily  Continue Albuterol 2 puffs every 4-6 hours as needed for breakthrough shortness of breath or wheezing  Ok stop Spiriva Respimat Take Singulair (motelukast) every night contistently at bedtime  Zyrtec as needed for allergy symptoms   Follow-up: As needed

## 2020-01-21 NOTE — Assessment & Plan Note (Signed)
-   COPD/asthma overlap. FEV1 1.44 (44%), ratio 50; Eosinophils 700 - Compliance with medication seems to be spotty. No benefit from Spiriva Respimat patient stopped - Continue Fluticasone-Salmeterol one puff twice daily and Albuterol hfa every 4-6 hours as needed for breakthrough shortness of breath or wheezing  - Recommend patient take Singulair contistently at bedtime  - Continue Zyrtec as needed for allergy symptoms  - Follow-up As needed basis with pulmonary

## 2020-01-26 DIAGNOSIS — C7951 Secondary malignant neoplasm of bone: Secondary | ICD-10-CM | POA: Diagnosis not present

## 2020-01-26 DIAGNOSIS — C7B8 Other secondary neuroendocrine tumors: Secondary | ICD-10-CM | POA: Diagnosis not present

## 2020-01-26 DIAGNOSIS — C786 Secondary malignant neoplasm of retroperitoneum and peritoneum: Secondary | ICD-10-CM | POA: Diagnosis not present

## 2020-01-26 DIAGNOSIS — Z7952 Long term (current) use of systemic steroids: Secondary | ICD-10-CM | POA: Diagnosis not present

## 2020-01-26 DIAGNOSIS — C7A8 Other malignant neuroendocrine tumors: Secondary | ICD-10-CM | POA: Diagnosis not present

## 2020-01-26 DIAGNOSIS — Z791 Long term (current) use of non-steroidal anti-inflammatories (NSAID): Secondary | ICD-10-CM | POA: Diagnosis not present

## 2020-01-26 DIAGNOSIS — Z7951 Long term (current) use of inhaled steroids: Secondary | ICD-10-CM | POA: Diagnosis not present

## 2020-01-26 DIAGNOSIS — C787 Secondary malignant neoplasm of liver and intrahepatic bile duct: Secondary | ICD-10-CM | POA: Diagnosis not present

## 2020-01-26 DIAGNOSIS — Z87891 Personal history of nicotine dependence: Secondary | ICD-10-CM | POA: Diagnosis not present

## 2020-01-26 DIAGNOSIS — Z79899 Other long term (current) drug therapy: Secondary | ICD-10-CM | POA: Diagnosis not present

## 2020-01-29 DIAGNOSIS — C7B8 Other secondary neuroendocrine tumors: Secondary | ICD-10-CM | POA: Diagnosis not present

## 2020-01-29 DIAGNOSIS — C7951 Secondary malignant neoplasm of bone: Secondary | ICD-10-CM | POA: Diagnosis not present

## 2020-02-02 ENCOUNTER — Encounter: Payer: Self-pay | Admitting: Internal Medicine

## 2020-02-02 MED ORDER — AIRDUO DIGIHALER 232-14 MCG/ACT IN AEPB: 1.0000 | INHALATION_SPRAY | Freq: Two times a day (BID) | RESPIRATORY_TRACT | 3 refills | Status: DC

## 2020-02-02 NOTE — Telephone Encounter (Signed)
Dr. Melvyn Novas, patient requesting a script for two Airduo inhalers so he can have a spare. Can we also change his refill to 90 day supply (3 inhalers)?  Patient is also reporting an increased SOB over the last two days, mild wheeze, fatigue, and not being able to take a full breath. His biggest complaint is not being able to sleep with anxiety about breathing.   Please advise.

## 2020-02-02 NOTE — Telephone Encounter (Signed)
Pt has had a recent OV with Derl Barrow, NP 01/21/20. Prior to that appt, pt saw Dr. Tamala Julian 12/15/19. Dr. Tamala Julian is now working solely in the hospital.  Eustaquio Maize stated for pt to continue taking the fluticasone-salmeterol one puff twice daily and for him to f/u as needed.

## 2020-02-02 NOTE — Telephone Encounter (Signed)
It doesn't look like he has any f/u appts  with me and I had planned on just seeing him prn at last ov 2018    Will need at the very least a televisit (ok to add on at lunch and let pt know I'll call when I can) if can't come in for ov (if not vaccinated strongly prefer televisit)

## 2020-02-19 DIAGNOSIS — C7A8 Other malignant neuroendocrine tumors: Secondary | ICD-10-CM | POA: Diagnosis not present

## 2020-02-19 DIAGNOSIS — C7951 Secondary malignant neoplasm of bone: Secondary | ICD-10-CM | POA: Diagnosis not present

## 2020-02-24 DIAGNOSIS — C7B8 Other secondary neuroendocrine tumors: Secondary | ICD-10-CM | POA: Diagnosis not present

## 2020-02-24 DIAGNOSIS — C7A8 Other malignant neuroendocrine tumors: Secondary | ICD-10-CM | POA: Diagnosis not present

## 2020-03-22 DIAGNOSIS — D696 Thrombocytopenia, unspecified: Secondary | ICD-10-CM | POA: Diagnosis not present

## 2020-03-22 DIAGNOSIS — Z7952 Long term (current) use of systemic steroids: Secondary | ICD-10-CM | POA: Diagnosis not present

## 2020-03-22 DIAGNOSIS — Z79899 Other long term (current) drug therapy: Secondary | ICD-10-CM | POA: Diagnosis not present

## 2020-03-22 DIAGNOSIS — M25511 Pain in right shoulder: Secondary | ICD-10-CM | POA: Diagnosis not present

## 2020-03-22 DIAGNOSIS — Z791 Long term (current) use of non-steroidal anti-inflammatories (NSAID): Secondary | ICD-10-CM | POA: Diagnosis not present

## 2020-03-22 DIAGNOSIS — Z7951 Long term (current) use of inhaled steroids: Secondary | ICD-10-CM | POA: Diagnosis not present

## 2020-03-22 DIAGNOSIS — C7B8 Other secondary neuroendocrine tumors: Secondary | ICD-10-CM | POA: Diagnosis not present

## 2020-03-22 DIAGNOSIS — Z87891 Personal history of nicotine dependence: Secondary | ICD-10-CM | POA: Diagnosis not present

## 2020-03-22 DIAGNOSIS — C7A8 Other malignant neuroendocrine tumors: Secondary | ICD-10-CM | POA: Diagnosis not present

## 2020-03-23 DIAGNOSIS — C7B8 Other secondary neuroendocrine tumors: Secondary | ICD-10-CM | POA: Diagnosis not present

## 2020-03-23 DIAGNOSIS — C7A8 Other malignant neuroendocrine tumors: Secondary | ICD-10-CM | POA: Diagnosis not present

## 2020-04-01 DIAGNOSIS — E890 Postprocedural hypothyroidism: Secondary | ICD-10-CM | POA: Diagnosis not present

## 2020-04-01 DIAGNOSIS — J449 Chronic obstructive pulmonary disease, unspecified: Secondary | ICD-10-CM | POA: Diagnosis not present

## 2020-04-01 DIAGNOSIS — Z1389 Encounter for screening for other disorder: Secondary | ICD-10-CM | POA: Diagnosis not present

## 2020-04-01 DIAGNOSIS — D3A8 Other benign neuroendocrine tumors: Secondary | ICD-10-CM | POA: Diagnosis not present

## 2020-04-01 DIAGNOSIS — Z0000 Encounter for general adult medical examination without abnormal findings: Secondary | ICD-10-CM | POA: Diagnosis not present

## 2020-04-01 DIAGNOSIS — E782 Mixed hyperlipidemia: Secondary | ICD-10-CM | POA: Diagnosis not present

## 2020-04-01 DIAGNOSIS — F419 Anxiety disorder, unspecified: Secondary | ICD-10-CM | POA: Diagnosis not present

## 2020-04-01 DIAGNOSIS — Z23 Encounter for immunization: Secondary | ICD-10-CM | POA: Diagnosis not present

## 2020-04-01 DIAGNOSIS — G4700 Insomnia, unspecified: Secondary | ICD-10-CM | POA: Diagnosis not present

## 2020-04-03 DIAGNOSIS — Z23 Encounter for immunization: Secondary | ICD-10-CM | POA: Diagnosis not present

## 2020-04-20 DIAGNOSIS — C7A8 Other malignant neuroendocrine tumors: Secondary | ICD-10-CM | POA: Diagnosis not present

## 2020-04-20 DIAGNOSIS — C7B8 Other secondary neuroendocrine tumors: Secondary | ICD-10-CM | POA: Diagnosis not present

## 2020-04-27 ENCOUNTER — Telehealth: Payer: Self-pay | Admitting: Internal Medicine

## 2020-04-27 NOTE — Telephone Encounter (Signed)
Spoke with the pt  He is c/o increased DOE x 2 wks  He states that he gets winded just walking across the room sometimes  He is not coughing, wheezing, f/c/s or having any other symptoms  He is still on the AirDuo and takes this 1 puff bid  Has been covid vaccinated  He is requesting in person appt  Please advise if okay, thanks

## 2020-04-27 NOTE — Telephone Encounter (Signed)
Yes ok for in visit apt, first available with APP or his pulmonary MD

## 2020-04-27 NOTE — Telephone Encounter (Signed)
Called and spoke with patient to let him know that he can come into office for visit. Patient asked if anything open this week and he is scheduled for appointment with Dr. Shearon Stalls next month and is to far away. Patient has been scheduled with Tammy for tomorrow at 11:30. Nothing further needed at this time.

## 2020-04-28 ENCOUNTER — Encounter: Payer: Self-pay | Admitting: Adult Health

## 2020-04-28 ENCOUNTER — Other Ambulatory Visit: Payer: Self-pay

## 2020-04-28 ENCOUNTER — Ambulatory Visit (INDEPENDENT_AMBULATORY_CARE_PROVIDER_SITE_OTHER): Payer: Medicare Other | Admitting: Adult Health

## 2020-04-28 ENCOUNTER — Ambulatory Visit (INDEPENDENT_AMBULATORY_CARE_PROVIDER_SITE_OTHER): Payer: Medicare Other

## 2020-04-28 VITALS — BP 96/60 | HR 76 | Temp 97.2°F | Ht 70.0 in | Wt 155.6 lb

## 2020-04-28 DIAGNOSIS — R0600 Dyspnea, unspecified: Secondary | ICD-10-CM

## 2020-04-28 DIAGNOSIS — J449 Chronic obstructive pulmonary disease, unspecified: Secondary | ICD-10-CM

## 2020-04-28 DIAGNOSIS — R0602 Shortness of breath: Secondary | ICD-10-CM | POA: Diagnosis not present

## 2020-04-28 DIAGNOSIS — R0609 Other forms of dyspnea: Secondary | ICD-10-CM

## 2020-04-28 LAB — D-DIMER, QUANTITATIVE: D-Dimer, Quant: 0.24 mcg/mL FEU (ref <0.50)

## 2020-04-28 LAB — CBC WITH DIFFERENTIAL/PLATELET
Basophils Absolute: 0 10*3/uL (ref 0.0–0.1)
Basophils Relative: 0.8 % (ref 0.0–3.0)
Eosinophils Absolute: 0.2 10*3/uL (ref 0.0–0.7)
Eosinophils Relative: 3.7 % (ref 0.0–5.0)
HCT: 40.6 % (ref 39.0–52.0)
Hemoglobin: 14.1 g/dL (ref 13.0–17.0)
Lymphocytes Relative: 8.1 % — ABNORMAL LOW (ref 12.0–46.0)
Lymphs Abs: 0.4 10*3/uL — ABNORMAL LOW (ref 0.7–4.0)
MCHC: 34.7 g/dL (ref 30.0–36.0)
MCV: 97.1 fl (ref 78.0–100.0)
Monocytes Absolute: 0.7 10*3/uL (ref 0.1–1.0)
Monocytes Relative: 13.3 % — ABNORMAL HIGH (ref 3.0–12.0)
Neutro Abs: 4 10*3/uL (ref 1.4–7.7)
Neutrophils Relative %: 74.1 % (ref 43.0–77.0)
Platelets: 135 10*3/uL — ABNORMAL LOW (ref 150.0–400.0)
RBC: 4.18 Mil/uL — ABNORMAL LOW (ref 4.22–5.81)
RDW: 14.8 % (ref 11.5–15.5)
WBC: 5.4 10*3/uL (ref 4.0–10.5)

## 2020-04-28 LAB — COMPREHENSIVE METABOLIC PANEL
ALT: 13 U/L (ref 0–53)
AST: 19 U/L (ref 0–37)
Albumin: 4.1 g/dL (ref 3.5–5.2)
Alkaline Phosphatase: 35 U/L — ABNORMAL LOW (ref 39–117)
BUN: 12 mg/dL (ref 6–23)
CO2: 31 mEq/L (ref 19–32)
Calcium: 8.9 mg/dL (ref 8.4–10.5)
Chloride: 104 mEq/L (ref 96–112)
Creatinine, Ser: 0.8 mg/dL (ref 0.40–1.50)
GFR: 88.11 mL/min (ref >60.00)
Glucose, Bld: 94 mg/dL (ref 70–99)
Potassium: 4.3 mEq/L (ref 3.5–5.1)
Sodium: 141 mEq/L (ref 135–145)
Total Bilirubin: 0.8 mg/dL (ref 0.2–1.2)
Total Protein: 6.1 g/dL (ref 6.0–8.3)

## 2020-04-28 NOTE — Progress Notes (Signed)
@Patient  ID: Mark Giles, male    DOB: Jan 05, 1946, 74 y.o.   MRN: [id number]  Chief Complaint  Patient presents with  . Follow-up    COPD     Referring provider: London Pepper, MD  HPI: 74 year old male former smoker quit in 2015 (90-pack-year history) followed for severe COPD Medical history significant for metastatic appendiceal cancer-neuroendocrine tumor  TEST/EVENTS :  PFT's  03/17/2016  FEV1 1.44 (44 % ) ratio 50  p 6 % improvement from saba p nothing prior to study with DLCO  44 % corrects to 34  % for alv volume    04/28/2020 Acute OV : COPD  Patient presents for an acute office visit.  Complains of that he has sudden onset of worsening dyspnea 3 weeks ago. Noticed that 3 weeks ago, baseline dyspnea and activity level changed. Now more dyspnea with walking and normal activities.  Prior to this he was very active, going to the gym, walking 2 miles on regular basis. Still walking on flat surface but does get more winded.  Rare cough. No calf pain or swelling . No hemoptysis or chest pain. Weight is stable currently .  He remains on air duo twice daily.  He is on Singulair daily.  Was tried on Spiriva previously but did not feel like he had any clinical benefit.  Patient has known metastatic appendiceal cancer -neuroendocrine tumor. Followed at [facility] and [facility].  Next follow-up scans are in March 2022  Says overall feeling okay, has some fatigue but not severe. Able to do yard work.  Does feel some lightheadedness with standing on occasion.  Denies any nausea vomiting, chest pain, orthopnea, hemoptysis, known bleeding. Denies any known history of blood clots.  No calf pain or tenderness.   No Known Allergies  Immunization History  Administered Date(s) Administered  . Fluad Quad(high Dose 65+) 03/17/2020  . Influenza Split 04/02/2014  . Influenza,inj,quad, With Preservative 04/11/2019  . PFIZER SARS-COV-2 Vaccination 07/25/2019, 08/15/2019  .  Pneumococcal Conjugate-13 07/22/2013    Past Medical History:  Diagnosis Date  . Anxiety   . Chronic back pain    History of   . COPD (chronic obstructive pulmonary disease) ([place])   . Dyslipidemia   . H/O: depression   . Hyperlipidemia   . Inguinal hernia without mention of obstruction or gangrene, unilateral or unspecified, (not specified as recurrent) 2013  . Tobacco abuse     Tobacco History: Social History   Tobacco Use  Smoking Status Former Smoker  . Packs/day: 3.00  . Years: 30.00  . Pack years: 90.00  . Types: Cigarettes, E-cigarettes  . Quit date: 01/31/2014  . Years since quitting: 6.2  Smokeless Tobacco Never Used  Tobacco Comment   uses e-cig   Counseling given: Not Answered Comment: uses e-cig   Outpatient Medications Prior to Visit  Medication Sig Dispense Refill  . albuterol (VENTOLIN HFA) 108 (90 Base) MCG/ACT inhaler Inhale 2 puffs into the lungs every 4 (four) hours as needed for wheezing or shortness of breath.     Marland Kitchen buPROPion (WELLBUTRIN XL) 300 MG 24 hr tablet Take 300 mg by mouth every evening.     . cetirizine (ZYRTEC ALLERGY) 10 MG tablet Take 1 tablet (10 mg total) by mouth at bedtime. 30 tablet 11  . Fluticasone-Salmeterol,sensor, (AIRDUO DIGIHALER) 232-14 MCG/ACT AEPB Inhale 1 Inhaler into the lungs in the morning and at bedtime. 3 each 3  . levothyroxine (SYNTHROID, LEVOTHROID) 100 MCG tablet Take 100 mcg  by mouth every evening.     . lovastatin (MEVACOR) 20 MG tablet Take 20 mg by mouth at bedtime.     . montelukast (SINGULAIR) 10 MG tablet Take 1 tablet (10 mg total) by mouth at bedtime. 30 tablet 11  . tamsulosin (FLOMAX) 0.4 MG CAPS capsule Take 0.4 mg by mouth daily after supper.     . escitalopram (LEXAPRO) 10 MG tablet Take 10 mg by mouth at bedtime.  (Patient not taking: Reported on 04/28/2020)    . omeprazole (PRILOSEC) 20 MG capsule Take 1 capsule (20 mg total) by mouth daily. (Patient not taking: Reported on 04/28/2020) 30 capsule 11    . ondansetron (ZOFRAN) 4 MG tablet Take 1 tablet (4 mg total) by mouth daily as needed for nausea or vomiting. (Patient not taking: Reported on 04/28/2020) 30 tablet 0  . senna (SENOKOT) 8.6 MG TABS tablet Take 2 tablets (17.2 mg total) by mouth at bedtime. (Patient not taking: Reported on 04/28/2020) 120 tablet 0  . Tiotropium Bromide Monohydrate (SPIRIVA RESPIMAT) 2.5 MCG/ACT AERS Inhale 2 puffs into the lungs daily. (Patient not taking: Reported on 04/28/2020) 4 g 0  . traZODone (DESYREL) 50 MG tablet Take 50 mg by mouth at bedtime. (Patient not taking: Reported on 04/28/2020)     No facility-administered medications prior to visit.     Review of Systems:   Constitutional:   No  weight loss, night sweats,  Fevers, chills,  +fatigue, or  lassitude.  HEENT:   No headaches,  Difficulty swallowing,  Tooth/dental problems, or  Sore throat,                No sneezing, itching, ear ache, nasal congestion, post nasal drip,   CV:  No chest pain,  Orthopnea, PND, swelling in lower extremities, anasarca, dizziness, palpitations, syncope.   GI  No heartburn, indigestion, abdominal pain, nausea, vomiting, diarrhea, change in bowel habits, loss of appetite, bloody stools.   Resp:  .  No chest wall deformity  Skin: no rash or lesions.  GU: no dysuria, change in color of urine, no urgency or frequency.  No flank pain, no hematuria   MS:  No joint pain or swelling.  No decreased range of motion.  No back pain.    Physical Exam  BP 96/60 (BP Location: Left Arm)   Pulse 76   Temp (!) 97.2 F (36.2 C) (Temporal)   Ht 5\' 10"  (1.778 m)   Wt 155 lb 9.6 oz (70.6 kg)   SpO2 97%   BMI 22.33 kg/m   GEN: A/Ox3; pleasant , NAD, well nourished    HEENT:  [place]/AT,   NOSE-clear, THROAT-clear, no lesions, no postnasal drip or exudate noted.   NECK:  Supple w/ fair ROM; no JVD; normal carotid impulses w/o bruits; no thyromegaly or nodules palpated; no lymphadenopathy.    RESP  Clear  P & A; w/o,  wheezes/ rales/ or rhonchi. no accessory muscle use, no dullness to percussion  CARD:  RRR, no m/r/g, no peripheral edema, pulses intact, no cyanosis or clubbing. No calf tenderness   GI:   Soft & nt; nml bowel sounds; no organomegaly or masses detected.   Musco: Warm bil, no deformities or joint swelling noted.   Neuro: alert, no focal deficits noted.    Skin: Warm, no lesions or rashes    Lab Results:  CBC    Component Value Date/Time   WBC 8.5 11/02/2019 0451   RBC 4.82 11/02/2019 0451   HGB  15.0 11/02/2019 0451   HCT 46.9 11/02/2019 0451   PLT 200 11/02/2019 0451   MCV 97.3 11/02/2019 0451   MCH 31.1 11/02/2019 0451   MCHC 32.0 11/02/2019 0451   RDW 13.3 11/02/2019 0451   LYMPHSABS 1.6 11/02/2019 0451   MONOABS 1.1 (H) 11/02/2019 0451   EOSABS 0.7 (H) 11/02/2019 0451   BASOSABS 0.1 11/02/2019 0451    BMET    Component Value Date/Time   NA 140 11/02/2019 0451   K 4.4 11/02/2019 0451   CL 104 11/02/2019 0451   CO2 30 11/02/2019 0451   GLUCOSE 83 11/02/2019 0451   BUN 7 (L) 11/02/2019 0451   CREATININE 0.95 11/02/2019 0451   CALCIUM 9.3 11/02/2019 0451   GFRNONAA >60 11/02/2019 0451   GFRAA >60 11/02/2019 0451    BNP No results found for: BNP  ProBNP No results found for: PROBNP  Imaging: No results found.    PFT Results Latest Ref Rng & Units 03/17/2016  FVC-Pre L 2.53  FVC-Predicted Pre % 57  FVC-Post L 2.88  FVC-Predicted Post % 65  Pre FEV1/FVC % % 54  Post FEV1/FCV % % 50  FEV1-Pre L 1.36  FEV1-Predicted Pre % 41  FEV1-Post L 1.44  DLCO uncorrected ml/min/mmHg 13.98  DLCO UNC% % 44  DLVA Predicted % 34  TLC L 7.37  TLC % Predicted % 106  RV % Predicted % 188    No results found for: NITRICOXIDE      Assessment & Plan:   COPD GOLD III Severe COPD with recent increase symptom burden -with dyspnea and decreased activity tolerance questionable etiology.  He has no associated cough or wheezing. We will check chest x-ray today.   Along with lab work including a D-dimer. If these are unrevealing.  Could consider adding LAMA back to his regimen so he would have to be on triple therapy  Plan Patient Instructions  Continue on AirDuo Twice daily, rinse after use.  Chest xray and labs today .      Dyspnea Increased dyspnea and decreased activity tolerance questionable etiology.  Walk test in the office does not show any desaturations on room air. We will check chest x-ray and labs today.  D-dimer is pending.  If positive will need a CTA with PE protocol.  Patient is at increased risk with underlying metastatic cancer.  Plan  Patient Instructions  Continue on AirDuo Twice daily, rinse after use.  Chest xray and labs today .  Follow up in 4 weeks as planned and As needed   Please contact office for sooner follow up if symptoms do not improve or worsen or seek emergency care           Rexene Edison, NP 04/28/2020

## 2020-04-28 NOTE — Assessment & Plan Note (Signed)
Increased dyspnea and decreased activity tolerance questionable etiology.  Walk test in the office does not show any desaturations on room air. We will check chest x-ray and labs today.  D-dimer is pending.  If positive will need a CTA with PE protocol.  Patient is at increased risk with underlying metastatic cancer.  Plan  Patient Instructions  Continue on AirDuo Twice daily, rinse after use.  Chest xray and labs today .  Follow up in 4 weeks as planned and As needed   Please contact office for sooner follow up if symptoms do not improve or worsen or seek emergency care

## 2020-04-28 NOTE — Assessment & Plan Note (Signed)
Severe COPD with recent increase symptom burden -with dyspnea and decreased activity tolerance questionable etiology.  He has no associated cough or wheezing. We will check chest x-ray today.  Along with lab work including a D-dimer. If these are unrevealing.  Could consider adding LAMA back to his regimen so he would have to be on triple therapy  Plan Patient Instructions  Continue on AirDuo Twice daily, rinse after use.  Chest xray and labs today .

## 2020-04-28 NOTE — Patient Instructions (Addendum)
Continue on AirDuo Twice daily, rinse after use.  Chest xray and labs today .  Follow up in 4 weeks as planned and As needed   Please contact office for sooner follow up if symptoms do not improve or worsen or seek emergency care

## 2020-05-19 DIAGNOSIS — C7B8 Other secondary neuroendocrine tumors: Secondary | ICD-10-CM | POA: Diagnosis not present

## 2020-05-19 DIAGNOSIS — Z79899 Other long term (current) drug therapy: Secondary | ICD-10-CM | POA: Diagnosis not present

## 2020-05-19 DIAGNOSIS — C7A8 Other malignant neuroendocrine tumors: Secondary | ICD-10-CM | POA: Diagnosis not present

## 2020-05-25 ENCOUNTER — Ambulatory Visit: Payer: Medicare Other | Admitting: Internal Medicine

## 2020-06-17 DIAGNOSIS — C7A8 Other malignant neuroendocrine tumors: Secondary | ICD-10-CM | POA: Diagnosis not present

## 2020-06-17 DIAGNOSIS — C7B8 Other secondary neuroendocrine tumors: Secondary | ICD-10-CM | POA: Diagnosis not present

## 2020-07-05 NOTE — Telephone Encounter (Signed)
Fine to convert to virtual visit.

## 2020-07-05 NOTE — Telephone Encounter (Signed)
Dr. Bing Quarry,  Please see patient comment and advise.  Thank you.

## 2020-07-13 DIAGNOSIS — E782 Mixed hyperlipidemia: Secondary | ICD-10-CM | POA: Diagnosis not present

## 2020-07-13 DIAGNOSIS — K069 Disorder of gingiva and edentulous alveolar ridge, unspecified: Secondary | ICD-10-CM | POA: Diagnosis not present

## 2020-07-13 DIAGNOSIS — D3A8 Other benign neuroendocrine tumors: Secondary | ICD-10-CM | POA: Diagnosis not present

## 2020-07-13 DIAGNOSIS — F419 Anxiety disorder, unspecified: Secondary | ICD-10-CM | POA: Diagnosis not present

## 2020-07-13 DIAGNOSIS — F330 Major depressive disorder, recurrent, mild: Secondary | ICD-10-CM | POA: Diagnosis not present

## 2020-07-13 DIAGNOSIS — N400 Enlarged prostate without lower urinary tract symptoms: Secondary | ICD-10-CM | POA: Diagnosis not present

## 2020-07-13 DIAGNOSIS — E890 Postprocedural hypothyroidism: Secondary | ICD-10-CM | POA: Diagnosis not present

## 2020-07-13 DIAGNOSIS — J449 Chronic obstructive pulmonary disease, unspecified: Secondary | ICD-10-CM | POA: Diagnosis not present

## 2020-07-14 ENCOUNTER — Other Ambulatory Visit: Payer: Self-pay

## 2020-07-14 ENCOUNTER — Encounter: Payer: Self-pay | Admitting: Internal Medicine

## 2020-07-14 ENCOUNTER — Ambulatory Visit (INDEPENDENT_AMBULATORY_CARE_PROVIDER_SITE_OTHER): Payer: Medicare Other | Admitting: Internal Medicine

## 2020-07-14 DIAGNOSIS — J449 Chronic obstructive pulmonary disease, unspecified: Secondary | ICD-10-CM

## 2020-07-14 NOTE — Patient Instructions (Signed)
The patient should have follow up scheduled with myself in 2 months.    Understanding COPD   What is COPD? COPD stands for chronic obstructive pulmonary (lung) disease. COPD is a general term used for several lung diseases.  COPD is an umbrella term and encompasses other  common diseases in this group like chronic bronchitis and emphysema. Chronic asthma may also be included in this group. While some patients with COPD have only chronic bronchitis or emphysema, most patients have a combination of both.  You might hear these terms used in exchange for one another.   COPD adds to the work of the heart. Diseased lungs may reduce the amount of oxygen that goes to the blood. High blood pressure in blood vessels from the heart to the lungs makes it difficult for the heart to pump. Lung disease can also cause the body to produce too many red blood cells which may make the blood thicker and harder to pump.   Patients who have COPD with low oxygen levels may develop an enlarged heart (cor pulmonale). This condition weakens the heart and causes increased shortness of breath and swelling in the legs and feet.   Chronic bronchitis Chronic bronchitis is irritation and inflammation (swelling) of the lining in the bronchial tubes (air passages). The irritation causes coughing and an excess amount of mucus in the airways. The swelling makes it difficult to get air in and out of the lungs. The small, hair-like structures on the inside of the airways (called cilia) may be damaged by the irritation. The cilia are then unable to help clean mucus from the airways.  Bronchitis is generally considered to be chronic when you have: a productive cough (cough up mucus) and shortness of breath that lasts about 3 months or more each year for 2 or more years in a row. Your doctor may define chronic bronchitis differently.   Emphysema Emphysema is the destruction, or breakdown, of the walls of the alveoli (air sacs) located at  the end of the bronchial tubes. The damaged alveoli are not able to exchange oxygen and carbon dioxide between the lungs and the blood. The bronchioles lose their elasticity and collapse when you exhale, trapping air in the lungs. The trapped air keeps fresh air and oxygen from entering the lungs.   Who is affected by COPD? Emphysema and chronic bronchitis affect approximately 16 million people in the [place], or close to 11 percent of the population.   Symptoms of COPD   Shortness of breath   Shortness of breath with mild exercise (walking, using the stairs, etc.)   Chronic, productive cough (with mucus)   A feeling of "tightness" in the chest   Wheezing   What causes COPD? The two primary causes of COPD are cigarette smoking and alpha1-antitrypsin (AAT) deficiency. Air pollution and occupational dusts may also contribute to COPD, especially when the person exposed to these substances is a cigarette smoker.  Cigarette smoke causes COPD by irritating the airways and creating inflammation that narrows the airways, making it more difficult to breathe. Cigarette smoke also causes the cilia to stop working properly so mucus and trapped particles are not cleaned from the airways. As a result, chronic cough and excess mucus production develop, leading to chronic bronchitis.  In some people, chronic bronchitis and infections can lead to destruction of the small airways, or emphysema.  AAT deficiency, an inherited disorder, can also lead to emphysema. Alpha antitrypsin (AAT) is a protective material produced in the  liver and transported to the lungs to help combat inflammation. When there is not enough of the chemical AAT, the body is no longer protected from an enzyme in the white blood cells.   How is COPD diagnosed?  To diagnose COPD, the physician needs to know: . Do you smoke?  . Have you had chronic exposure to dust or air pollutants?  . Do other members of your family have lung  disease?  Marland Kitchen Are you short of breath?  . Do you get short of breath with exercise?  Marland Kitchen Do you have chronic cough and/or wheezing?  Marland Kitchen Do you cough up excess mucus?  To help with the diagnosis, the physician will conduct a thorough physical exam which includes:  1. Listening to your lungs and heart  2. Checking your blood pressure and pulse  3. Examining your nose and throat  4. Checking your feet and ankles for swelling   Laboratory and other tests Several laboratory and other tests are needed to confirm a diagnosis of COPD. These tests may include:  . Chest X-ray to look for lung changes that could be caused by COPD  .  Spirometry and pulmonary function tests (PFTs) to determine lung volume and air flow  . Pulse oximetry to measure the saturation of oxygen in the blood  . Arterial blood gases (ABGs) to determine the amount of oxygen and carbon dioxide in the blood  . Exercise testing to determine if the oxygen level in the blood drops during exercise   Treatment In the beginning stages of COPD, there is minimal shortness of breath that may be noticed only during exercise. As the disease progresses, shortness of breath may worsen and you may need to wear an oxygen device.   To help control other symptoms of COPD, the following treatments and lifestyle changes may be prescribed.  . Quitting smoking  . Avoiding cigarette smoke and other irritants  . Taking medications including: a. bronchodilators b. anti-inflammatory agents c. oxygen d. antibiotics  . Maintaining a healthy diet  . Following a structured exercise program such as pulmonary rehabilitation . Preventing respiratory infections  . Controlling stress   If your COPD progresses, you may be eligible to be evaluated for lung volume reduction surgery or lung transplantation. You may also be eligible to participate in certain clinical trials (research studies). Ask your health care providers about studies being conducted in your  hospital.   What is the outlook? Although COPD can not be cured, its symptoms can be treated and your quality of life can be improved. Your prognosis or outlook for the future will depend on how well your lungs are functioning, your symptoms, and how well you respond to and follow your treatment plan.
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
## 2020-07-16 DIAGNOSIS — Z79899 Other long term (current) drug therapy: Secondary | ICD-10-CM | POA: Diagnosis not present

## 2020-07-16 DIAGNOSIS — C7A8 Other malignant neuroendocrine tumors: Secondary | ICD-10-CM | POA: Diagnosis not present

## 2020-07-16 DIAGNOSIS — C7B8 Other secondary neuroendocrine tumors: Secondary | ICD-10-CM | POA: Diagnosis not present

## 2020-07-26 ENCOUNTER — Ambulatory Visit (INDEPENDENT_AMBULATORY_CARE_PROVIDER_SITE_OTHER): Payer: Medicare Other | Admitting: Otolaryngology

## 2020-08-13 DIAGNOSIS — Z79899 Other long term (current) drug therapy: Secondary | ICD-10-CM | POA: Diagnosis not present

## 2020-08-13 DIAGNOSIS — C7B8 Other secondary neuroendocrine tumors: Secondary | ICD-10-CM | POA: Diagnosis not present

## 2020-08-13 DIAGNOSIS — C7A8 Other malignant neuroendocrine tumors: Secondary | ICD-10-CM | POA: Diagnosis not present

## 2020-08-31 ENCOUNTER — Telehealth: Payer: Self-pay | Admitting: Internal Medicine

## 2020-08-31 NOTE — Telephone Encounter (Signed)
I have never met him. May i suggest dr wert, young, hunsucker, dewald, byrum, icard.

## 2020-08-31 NOTE — Telephone Encounter (Signed)
Called and spoke with Patient.  Patient requested to change providers from Dr. Shearon Stalls to a new provider. Patient asked if Dr. Tamala Julian was still seeing Patients in office?  I explained Dr. Tamala Julian is only at the hospital now.  Patient does not know a provider to change to. Patient requested we pick a new pulmonologist for him.  Message routed to Dr. Shearon Stalls to advise on change

## 2020-08-31 NOTE — Telephone Encounter (Signed)
08/31/2020  Contacted patient.  Scheduled for a 30-minute office visit to establish care with Dr. Melvyn Novas at patient's request.  Patient scheduled for March/18th at [place], FNP

## 2020-08-31 NOTE — Telephone Encounter (Signed)
Noted  

## 2020-09-09 ENCOUNTER — Telehealth: Payer: Self-pay | Admitting: Internal Medicine

## 2020-09-09 NOTE — Telephone Encounter (Signed)
Call returned to patient, confirmed DOB. Patient is looking for records from 2013. I made him aware he will need to call [facility] records at [phone]. Voiced understanding.   Nothing further needed at this time.

## 2020-09-09 NOTE — Telephone Encounter (Signed)
Pt is calling in regards to an appt he had w/ Dr. Ina Homes in regards to a diagnosis that was given states he does not remember and cannot see any information in MyChart. [place] regard [phone]

## 2020-09-13 DIAGNOSIS — C7B8 Other secondary neuroendocrine tumors: Secondary | ICD-10-CM | POA: Diagnosis not present

## 2020-09-13 DIAGNOSIS — D3A8 Other benign neuroendocrine tumors: Secondary | ICD-10-CM | POA: Diagnosis not present

## 2020-09-13 DIAGNOSIS — C7A8 Other malignant neuroendocrine tumors: Secondary | ICD-10-CM | POA: Diagnosis not present

## 2020-09-13 DIAGNOSIS — M25511 Pain in right shoulder: Secondary | ICD-10-CM | POA: Diagnosis not present

## 2020-09-13 DIAGNOSIS — Z79899 Other long term (current) drug therapy: Secondary | ICD-10-CM | POA: Diagnosis not present

## 2020-09-17 ENCOUNTER — Other Ambulatory Visit: Payer: Self-pay

## 2020-09-17 ENCOUNTER — Encounter: Payer: Self-pay | Admitting: Internal Medicine

## 2020-09-17 ENCOUNTER — Ambulatory Visit (INDEPENDENT_AMBULATORY_CARE_PROVIDER_SITE_OTHER): Payer: Medicare Other | Admitting: Internal Medicine

## 2020-09-17 ENCOUNTER — Ambulatory Visit (INDEPENDENT_AMBULATORY_CARE_PROVIDER_SITE_OTHER): Payer: Medicare Other

## 2020-09-17 DIAGNOSIS — J449 Chronic obstructive pulmonary disease, unspecified: Secondary | ICD-10-CM

## 2020-09-17 DIAGNOSIS — J90 Pleural effusion, not elsewhere classified: Secondary | ICD-10-CM | POA: Diagnosis not present

## 2020-09-17 DIAGNOSIS — J439 Emphysema, unspecified: Secondary | ICD-10-CM | POA: Diagnosis not present

## 2020-09-17 MED ORDER — STIOLTO RESPIMAT 2.5-2.5 MCG/ACT IN AERS
2.0000 | INHALATION_SPRAY | Freq: Every day | RESPIRATORY_TRACT | 0 refills | Status: DC
Start: 2020-09-17 — End: 2020-11-09

## 2020-09-17 NOTE — Progress Notes (Signed)
Subjective:    Patient ID: Mark Giles, male    DOB: July 02, 1946,    MRN: [id number]    Brief patient profile:  25   yowm quit smoking 01/2014 with doe and dx as GOLD II copd as early as 05/06/12 but worse fatigue and sob since quit smoking so referred to pulmonary clinic 09/01/2014 by Dr Dr Theodoro Doing     History of Present Illness  09/01/2014 1st [facility] office visit/ Meela Wareing   Chief Complaint  Patient presents with  . Pulmonary Consult    Referred by Dr. Deforest Hoyles for eval of COPD. Pt states that he was dxed with COPD back in 2014. He c/o DOE with exertion such as taking out of trash. He states that is breathing seems to get worse every day.   was exercising but stopped aroud 6 m prior to OV  Due to fatigue = sob  Onset was gradual, pattern is progressive to point where sob in shower, sweeping floor Does ok walking in a big grocery store/ driveway to house is 10 degree angle has to stop half way up No better on spiriva  rec Trial of anoro     01/05/2016  f/u ov/Alaric Gladwin re: GOLD II  Copd/ maint rx with anoro but can't afford it  Chief Complaint  Patient presents with  . Follow-up    Pt last seen March 2016- he was given Anoro, but just started taking med approx 2 wks ago. His breathing has improved. He has occ cough with grey sputum.   Doe = MMRC2 = can't walk a nl pace on a flat grade s sob but does fine slow and flat eg up to a mile slow pace anoro seemed to helped some but having to pay cash as doesn't have Med part D rec Air duo 2 pffs each am if not improving ok to to change to Take 2 puffs first thing in am and then another 2 puffs about 12 hours later.  Work on inhaler technique:  relax and gently blow all the way out    03/17/2016  f/u ov/Dorri Ozturk re:  Copd III copd/ maint on airduo 2 each am  Chief Complaint  Patient presents with  . Follow-up    PFT done today. Pt states had hemoptysis 2 wks ago. CT Chest was done 9/11/7- order by Dr Lysle Rubens. Pt states he was told he  has PNA and is currently taking Augmentin. He states his breathing is doing well and he denies any co's today.    despite acute worsening 2 weeks prior to OV  Doing better this week so completed pfts previously scheduled.  contiues doe = mmrc2/still struggling with med costs and has no saba  rec Change air duo to one puff each am and add second dose in evening if needed    10/10/2016  f/u ov/Peggy Loge re: GOLD III copd/ maint air duo one puff bid and paying retail for all meds  Chief Complaint  Patient presents with  . Follow-up    51mo rov. pt states breathing is doing well. no new co's today.   doe = one mile slow pace but varies some = MMRC1 = can walk nl pace, flat grade, can't hurry or go uphills or steps s sob   No obvious day to day or daytime variability or assoc excess/ purulent sputum or mucus plugs or hemoptysis or cp or chest tightness, subjective wheeze or overt sinus or hb symptoms. No unusual exp hx or h/o  childhood pna/ asthma or knowledge of premature birth. rec No change in air duo = one puff in am  And then add the second dose if losing ground Remember to rinse and gargle after use of the air duo    09/17/2020  f/u ov/Dajanae Brophy re:  GOLD III maint on airduo s any tendency to exac  Chief Complaint  Patient presents with  . Follow-up    Former patient of Dr Tamala Julian. Breathing is overall doing well. He has occ cough with clear sputum and wheezing occ. He rarely uses his albuterol.   Dyspnea:  Able to walk up to a mile s stopping in 30 min  Cough:  minimal Sleeping: able to lie on side bed is flat / 2 pillows  SABA use: rarely  02: none  Covid status:   vax 3    No obvious day to day or daytime variability or assoc excess/ purulent sputum or mucus plugs or hemoptysis or cp or chest tightness, subjective wheeze or overt sinus or hb symptoms.   Sleeping as above  without nocturnal  or early am exacerbation  of respiratory  c/o's or need for noct saba. Also denies any obvious  fluctuation of symptoms with weather or environmental changes or other aggravating or alleviating factors except as outlined above   No unusual exposure hx or h/o childhood pna/ asthma or knowledge of premature birth.  Current Allergies, Complete Past Medical History, Past Surgical History, Family History, and Social History were reviewed in [employer] record.  ROS  The following are not active complaints unless bolded Hoarseness, sore throat, dysphagia, dental problems, itching, sneezing,  nasal congestion or discharge of excess mucus or purulent secretions, ear ache,   fever, chills, sweats, unintended wt loss or wt gain, classically pleuritic or exertional cp,  orthopnea pnd or arm/hand swelling  or leg swelling, presyncope, palpitations, abdominal pain, anorexia, nausea, vomiting, diarrhea  or change in bowel habits or change in bladder habits, change in stools or change in urine, dysuria, hematuria,  rash, arthralgias, visual complaints, headache, numbness, weakness or ataxia or problems with walking or coordination,  change in mood or  memory.        Current Meds  Medication Sig  . albuterol (VENTOLIN HFA) 108 (90 Base) MCG/ACT inhaler Inhale 2 puffs into the lungs every 4 (four) hours as needed for wheezing or shortness of breath.   Marland Kitchen buPROPion (WELLBUTRIN XL) 300 MG 24 hr tablet Take 300 mg by mouth every evening.   . Fluticasone-Salmeterol,sensor, (AIRDUO DIGIHALER) 232-14 MCG/ACT AEPB Inhale 1 Inhaler into the lungs in the morning and at bedtime.  Marland Kitchen levothyroxine (SYNTHROID, LEVOTHROID) 100 MCG tablet Take 100 mcg by mouth every evening.   . lovastatin (MEVACOR) 20 MG tablet Take 20 mg by mouth at bedtime.   . montelukast (SINGULAIR) 10 MG tablet Take 1 tablet (10 mg total) by mouth at bedtime.  . ondansetron (ZOFRAN) 4 MG tablet Take 1 tablet (4 mg total) by mouth daily as needed for nausea or vomiting.  . tamsulosin (FLOMAX) 0.4 MG CAPS capsule Take 0.4 mg by  mouth daily after supper.                        Objective:   Physical Exam    09/17/2020        149 10/10/2016        182  03/17/2016        167   01/05/16 159  lb (72.122 kg)  09/01/14 167 lb (75.751 kg)  10/22/12 160 lb (72.576 kg)      Vital signs reviewed  09/17/2020  - Note at rest 02 sats  100% on RA   General appearance:    Thin amb wm nad  HEENT : pt wearing mask not removed for exam due to covid -19 concerns.    NECK :  without JVD/Nodes/TM/ nl carotid upstrokes bilaterally   LUNGS: no acc muscle use,  Mod barrel  contour chest wall with bilateral  Distant bs s audible wheeze and  without cough on insp or exp maneuvers and mod  Hyperresonant  to  percussion bilaterally     CV:  RRR  no s3 or murmur or increase in P2, and no edema   ABD:  soft and nontender with pos mid insp Hoover's  in the supine position. No bruits or organomegaly appreciated, bowel sounds nl  MS:     ext warm without deformities, calf tenderness, cyanosis or clubbing No obvious joint restrictions   SKIN: warm and dry without lesions    NEURO:  alert, approp, nl sensorium with  no motor or cerebellar deficits apparent.        CXR PA and Lateral:   09/17/2020 :    I personally reviewed images / impression as follows:   Mod copd/ non-specific increased markings no change from baseline     Labs ordered 09/17/2020  :     alpha one AT phenotype        Assessment & Plan:

## 2020-09-17 NOTE — Patient Instructions (Addendum)
Plan A = Automatic = Always=    Stiolto 2 pffs each am  Work on inhaler technique:  relax and gently blow all the way out then take a nice smooth deep breath back in, triggering the inhaler at same time you start breathing in.  Hold for up to 5 seconds if you can.  Rinse and gargle with water when done  Plan B = Backup (to supplement plan A, not to replace it) Only use your albuterol inhaler as a rescue medication to be used if you can't catch your breath by resting or doing a relaxed purse lip breathing pattern.  - The less you use it, the better it will work when you need it. - Ok to use the inhaler up to 2 puffs  every 4 hours if you must but call for appointment if use goes up over your usual need - Don't leave home without it !!  (think of it like the spare tire for your car)   Ok to Try albuterol 15 min before an activity that you know would make you short of breath and see if it makes any difference and if makes none then don't take it after activity unless you can't catch your breath.   Please remember to go to the lab and x-ray department  for your tests - we will call you with the results when they are available.       Please schedule a follow up office visit in 6 weeks, call sooner if needed with pfts

## 2020-09-17 NOTE — Assessment & Plan Note (Signed)
Stopped smoking 2015 PFTs 05/06/12  FEV1  1.87 (55%) ratio 45 and dlco 43 corrects to 48  - trial of anoro 09/01/2014 >  Changed to airduo 01/06/2016 as could not afford anoro cash basis - 01/05/2016  After extensive coaching inhaler  effectiveness =    91% with respiclick mode) > try airduo 113 2bid - PFT's  03/17/2016  FEV1 1.44 (44 % ) ratio 50  p 6 % improvement from saba p nothing prior to study with DLCO  44 % corrects to 34  % for alv volume  - 09/17/2020  After extensive coaching inhaler device,  effectiveness =   90% wih smi > 6 week samples of stiolto  - Labs ordered 09/17/2020  :  alpha one AT phenotype      Pt is Group B in terms of symptom/risk and laba/lama therefore appropriate rx at this point >>>  Trial of stiolto and prn saba   Re saba: I spent extra time with pt today reviewing appropriate use of albuterol for prn use on exertion with the following points: 1) saba is for relief of sob that does not improve by walking a slower pace or resting but rather if the pt does not improve after trying this first. 2) If the pt is convinced, as many are, that saba helps recover from activity faster then it's easy to tell if this is the case by re-challenging : ie stop, take the inhaler, then p 5 minutes try the exact same activity (intensity of workload) that just caused the symptoms and see if they are substantially diminished or not after saba 3) if there is an activity that reproducibly causes the symptoms, try the saba 15 min before the activity on alternate days   If in fact the saba really does help, then fine to continue to use it prn but advised may need to look closer at the maintenance regimen being used to achieve better control of airways disease with exertion.    F/u with pfts in 6 weeks         Each maintenance medication was reviewed in detail including emphasizing most importantly the difference between maintenance and prns and under what circumstances the prns are to be  triggered using an action plan format where appropriate.  Total time for H and P, chart review, counseling, reviewing smi device(s) and generating customized AVS unique to this office visit to re-establish p > 3 y, same day charting  > 60 min

## 2020-09-20 NOTE — Progress Notes (Signed)
Called and left detailed msg on machine informing pt of results ok per DPR.

## 2020-09-25 LAB — ALPHA-1 ANTITRYPSIN PHENOTYPE: A-1 Antitrypsin, Ser: 149 mg/dL (ref 83–199)

## 2020-10-01 DIAGNOSIS — Z23 Encounter for immunization: Secondary | ICD-10-CM | POA: Diagnosis not present

## 2020-10-19 DIAGNOSIS — C7A8 Other malignant neuroendocrine tumors: Secondary | ICD-10-CM | POA: Diagnosis not present

## 2020-10-19 DIAGNOSIS — C7B8 Other secondary neuroendocrine tumors: Secondary | ICD-10-CM | POA: Diagnosis not present

## 2020-10-20 DIAGNOSIS — C7B8 Other secondary neuroendocrine tumors: Secondary | ICD-10-CM | POA: Diagnosis not present

## 2020-10-20 DIAGNOSIS — Z79899 Other long term (current) drug therapy: Secondary | ICD-10-CM | POA: Diagnosis not present

## 2020-10-20 DIAGNOSIS — C7A8 Other malignant neuroendocrine tumors: Secondary | ICD-10-CM | POA: Diagnosis not present

## 2020-10-20 DIAGNOSIS — R935 Abnormal findings on diagnostic imaging of other abdominal regions, including retroperitoneum: Secondary | ICD-10-CM | POA: Diagnosis not present

## 2020-11-03 DIAGNOSIS — D696 Thrombocytopenia, unspecified: Secondary | ICD-10-CM | POA: Diagnosis not present

## 2020-11-03 DIAGNOSIS — C7A8 Other malignant neuroendocrine tumors: Secondary | ICD-10-CM | POA: Diagnosis not present

## 2020-11-03 DIAGNOSIS — M25511 Pain in right shoulder: Secondary | ICD-10-CM | POA: Diagnosis not present

## 2020-11-03 DIAGNOSIS — D3A8 Other benign neuroendocrine tumors: Secondary | ICD-10-CM | POA: Diagnosis not present

## 2020-11-03 DIAGNOSIS — C7B8 Other secondary neuroendocrine tumors: Secondary | ICD-10-CM | POA: Diagnosis not present

## 2020-11-03 DIAGNOSIS — Z79899 Other long term (current) drug therapy: Secondary | ICD-10-CM | POA: Diagnosis not present

## 2020-11-05 ENCOUNTER — Other Ambulatory Visit (HOSPITAL_COMMUNITY)
Admission: RE | Admit: 2020-11-05 | Discharge: 2020-11-05 | Disposition: A | Discharge status: Home / Self Care | Payer: Medicare Other | Source: Ambulatory Visit | Attending: Internal Medicine | Admitting: Internal Medicine

## 2020-11-05 DIAGNOSIS — Z20822 Contact with and (suspected) exposure to covid-19: Secondary | ICD-10-CM | POA: Diagnosis not present

## 2020-11-05 DIAGNOSIS — Z01812 Encounter for preprocedural laboratory examination: Secondary | ICD-10-CM | POA: Insufficient documentation

## 2020-11-05 LAB — SARS CORONAVIRUS 2 (TAT 6-24 HRS): SARS Coronavirus 2: NEGATIVE

## 2020-11-08 ENCOUNTER — Other Ambulatory Visit: Payer: Self-pay | Admitting: *Deleted

## 2020-11-08 ENCOUNTER — Encounter: Payer: Self-pay | Admitting: *Deleted

## 2020-11-08 DIAGNOSIS — J449 Chronic obstructive pulmonary disease, unspecified: Secondary | ICD-10-CM

## 2020-11-08 NOTE — Progress Notes (Signed)
pft  

## 2020-11-09 ENCOUNTER — Encounter: Payer: Self-pay | Admitting: Internal Medicine

## 2020-11-09 ENCOUNTER — Other Ambulatory Visit: Payer: Self-pay

## 2020-11-09 ENCOUNTER — Ambulatory Visit (INDEPENDENT_AMBULATORY_CARE_PROVIDER_SITE_OTHER): Payer: Medicare Other | Admitting: Internal Medicine

## 2020-11-09 DIAGNOSIS — R0902 Hypoxemia: Secondary | ICD-10-CM | POA: Insufficient documentation

## 2020-11-09 DIAGNOSIS — J449 Chronic obstructive pulmonary disease, unspecified: Secondary | ICD-10-CM | POA: Diagnosis not present

## 2020-11-09 LAB — PULMONARY FUNCTION TEST
DL/VA % pred: 43 %
DL/VA: 1.76 ml/min/mmHg/L
DLCO cor % pred: 37 %
DLCO cor: 9.44 ml/min/mmHg
DLCO unc % pred: 35 %
DLCO unc: 8.93 ml/min/mmHg
FEF 25-75 Post: 0.49 L/sec
FEF 25-75 Pre: 0.45 L/sec
FEF2575-%Change-Post: 8 %
FEF2575-%Pred-Post: 21 %
FEF2575-%Pred-Pre: 19 %
FEV1-%Change-Post: 2 %
FEV1-%Pred-Post: 34 %
FEV1-%Pred-Pre: 33 %
FEV1-Post: 1.05 L
FEV1-Pre: 1.03 L
FEV1FVC-%Change-Post: 0 %
FEV1FVC-%Pred-Pre: 55 %
FEV6-%Change-Post: 3 %
FEV6-%Pred-Post: 64 %
FEV6-%Pred-Pre: 62 %
FEV6-Post: 2.58 L
FEV6-Pre: 2.49 L
FEV6FVC-%Change-Post: 2 %
FEV6FVC-%Pred-Post: 106 %
FEV6FVC-%Pred-Pre: 103 %
FVC-%Change-Post: 1 %
FVC-%Pred-Post: 61 %
FVC-%Pred-Pre: 60 %
FVC-Post: 2.58 L
FVC-Pre: 2.55 L
Post FEV1/FVC ratio: 41 %
Post FEV6/FVC ratio: 100 %
Pre FEV1/FVC ratio: 40 %
Pre FEV6/FVC Ratio: 98 %
RV % pred: 230 %
RV: 5.74 L
TLC % pred: 136 %
TLC: 9.5 L

## 2020-11-09 MED ORDER — BREZTRI AEROSPHERE 160-9-4.8 MCG/ACT IN AERO: 2.0000 | INHALATION_SPRAY | Freq: Two times a day (BID) | RESPIRATORY_TRACT | 0 refills | Status: DC

## 2020-11-09 MED ORDER — BREZTRI AEROSPHERE 160-9-4.8 MCG/ACT IN AERO: INHALATION_SPRAY | RESPIRATORY_TRACT | 0 refills | Status: DC

## 2020-11-09 NOTE — Assessment & Plan Note (Addendum)
11/09/2020  Pt was able to complete 3 laps at mod pace with 1 rest stop. Pt's O2 saturation dropped to 85% on RA at lap 1.5. Pt's O2 saturation level recovered to 47% with application of 2L of cont. O2. Pt had no c/o end of walk    Pt declined portable 02 at this point p discussion of risk/benefit.   Each maintenance medication was reviewed in detail including emphasizing most importantly the difference between maintenance and prns and under what circumstances the prns are to be triggered using an action plan format where appropriate.  Total time for H and P, chart review, counseling, reviewing hfa device(s) , directly observing portions of ambulatory 02 saturation study/ and generating customized AVS unique to this office visit / same day charting = 35 min

## 2020-11-09 NOTE — Patient Instructions (Addendum)
Plan A = Automatic = Always=    Breztri Take 2 puffs first thing in am and then another 2 puffs about 12 hours later.   Work on inhaler technique:  relax and gently blow all the way out then take a nice smooth deep breath back in, triggering the inhaler at same time you start breathing in.  Hold for up to 5 seconds if you can. Blow out thru nose. Rinse and gargle with water when done      Plan B = Backup (to supplement plan A, not to replace it) Only use your albuterol inhaler as a rescue medication to be used if you can't catch your breath by resting or doing a relaxed purse lip breathing pattern.  - The less you use it, the better it will work when you need it. - Ok to use the inhaler up to 2 puffs  every 4 hours if you must but call for appointment if use goes up over your usual need - Don't leave home without it !!  (think of it like the spare tire for your car)   To get the most out of exercise, you need to be continuously aware that you are short of breath, but never out of breath, for at least 30 minutes daily. As you improve, it will actually be easier for you to do the same amount of exercise  in  30 minutes so always push to the level where you are short of breath.     Please schedule a follow up visit in 6 months but call sooner if needed     .

## 2020-11-09 NOTE — Patient Instructions (Signed)
Full PFT performed today. °

## 2020-11-09 NOTE — Assessment & Plan Note (Signed)
MM/Stopped smoking 2015 PFTs 05/06/12  FEV1  1.87 (55%) ratio 45 and dlco 43 corrects to 48  - trial of anoro 09/01/2014 >  Changed to airduo 01/06/2016 as could not afford anoro cash basis - 01/05/2016  After extensive coaching inhaler  effectiveness =    16% with respiclick mode) > try airduo 113 2bid - PFT's  03/17/2016  FEV1 1.44 (44 % ) ratio 50  p 6 % improvement from saba p nothing prior to study with DLCO  44 % corrects to 34  % for alv volume  - 09/17/2020  After extensive coaching inhaler device,  effectiveness =   90% wih smi > 6 week samples of stiolto  - Labs ordered 09/17/2020  :  alpha one AT phenotype   MM   Level 149   -  PFT's  11/09/2020  FEV1 1.05 (34 % ) ratio 0.41  p 2 % improvement from saba p advair 250 prior to study with DLCO  8.93 (35%) corrects to 1.76(43%)  for alv volume and FV curve classically concave - 11/09/2020  After extensive coaching inhaler device,  effectiveness =  90%  > breztri  2bid      Group D in terms of symptom/risk and laba/lama/ICS  therefore appropriate rx at this point >>>  Try breztri 2bid plus prn saba  I spent extra time with pt today reviewing appropriate use of albuterol for prn use on exertion with the following points: 1) saba is for relief of sob that does not improve by walking a slower pace or resting but rather if the pt does not improve after trying this first. 2) If the pt is convinced, as many are, that saba helps recover from activity faster then it's easy to tell if this is the case by re-challenging : ie stop, take the inhaler, then p 5 minutes try the exact same activity (intensity of workload) that just caused the symptoms and see if they are substantially diminished or not after saba 3) if there is an activity that reproducibly causes the symptoms, try the saba 15 min before the activity on alternate days   If in fact the saba really does help, then fine to continue to use it prn but advised may need to look closer at the maintenance regimen  being used to achieve better control of airways disease with exertion.

## 2020-11-09 NOTE — Progress Notes (Signed)
Subjective:    Patient ID: Mark Giles, male    DOB: 01-20-1946    MRN: [id number]    Brief patient profile:  79   yowm MM/quit smoking 01/2014 with doe and dx as GOLD II copd as early as 05/06/12 but worse fatigue and sob since quit smoking so referred to pulmonary clinic 09/01/2014 by Dr Dr Theodoro Doing     History of Present Illness  09/01/2014 1st [facility] office visit/ Mark Giles   Chief Complaint  Patient presents with  . Pulmonary Consult    Referred by Dr. Deforest Hoyles for eval of COPD. Pt states that he was dxed with COPD back in 2014. He c/o DOE with exertion such as taking out of trash. He states that is breathing seems to get worse every day.   was exercising but stopped aroud 6 m prior to OV  Due to fatigue = sob  Onset was gradual, pattern is progressive to point where sob in shower, sweeping floor Does ok walking in a big grocery store/ driveway to house is 10 degree angle has to stop half way up No better on spiriva  rec Trial of anoro     01/05/2016  f/u ov/Mallery Harshman re: GOLD II  Copd/ maint rx with anoro but can't afford it  Chief Complaint  Patient presents with  . Follow-up    Pt last seen March 2016- he was given Anoro, but just started taking med approx 2 wks ago. His breathing has improved. He has occ cough with grey sputum.   Doe = MMRC2 = can't walk a nl pace on a flat grade s sob but does fine slow and flat eg up to a mile slow pace anoro seemed to helped some but having to pay cash as doesn't have Med part D rec Air duo 2 pffs each am if not improving ok to to change to Take 2 puffs first thing in am and then another 2 puffs about 12 hours later.  Work on inhaler technique:  relax and gently blow all the way out    03/17/2016  f/u ov/Mark Giles re:  Copd III copd/ maint on airduo 2 each am  Chief Complaint  Patient presents with  . Follow-up    PFT done today. Pt states had hemoptysis 2 wks ago. CT Chest was done 9/11/7- order by Dr Lysle Rubens. Pt states he was told he  has PNA and is currently taking Augmentin. He states his breathing is doing well and he denies any co's today.    despite acute worsening 2 weeks prior to OV  Doing better this week so completed pfts previously scheduled.  contiues doe = mmrc2/still struggling with med costs and has no saba  rec Change air duo to one puff each am and add second dose in evening if needed    10/10/2016  f/u ov/Mark Giles re: GOLD III copd/ maint air duo one puff bid and paying retail for all meds  Chief Complaint  Patient presents with  . Follow-up    63mo rov. pt states breathing is doing well. no new co's today.   doe = one mile slow pace but varies some = MMRC1 = can walk nl pace, flat grade, can't hurry or go uphills or steps s sob   No obvious day to day or daytime variability or assoc excess/ purulent sputum or mucus plugs or hemoptysis or cp or chest tightness, subjective wheeze or overt sinus or hb symptoms. No unusual exp hx or h/o  childhood pna/ asthma or knowledge of premature birth. rec No change in air duo = one puff in am  And then add the second dose if losing ground Remember to rinse and gargle after use of the air duo    09/17/2020  f/u ov/Mark Giles re:  GOLD III maint on airduo s any tendency to exac  Chief Complaint  Patient presents with  . Follow-up    Former patient of Dr Tamala Julian. Breathing is overall doing well. He has occ cough with clear sputum and wheezing occ. He rarely uses his albuterol.   Dyspnea:  Able to walk up to a mile s stopping in 30 min  Cough:  minimal Sleeping: able to lie on side bed is flat / 2 pillows  SABA use: rarely  02: none  Covid status:   vax 3  rec Plan A = Automatic = Always=    Stiolto 2 pffs each am Work on inhaler technique:    Plan B = Backup (to supplement plan A, not to replace it) Only use your albuterol inhaler as a rescue medication  Ok to Try albuterol 15 min before an activity that you know would make you short of breath and see if it makes any  difference and if makes none then don't take it after activity unless you can't catch your breath. Please remember to go to the lab and x-ray department  for your tests - we will call you with the results when they are available.   11/09/2020  f/u ov/Mark Giles re:  GOLD III  Copd back on advair 250 bid no better ex tol on stiolto Chief Complaint  Patient presents with  . Follow-up    Not using albuterol or stiolto. Using Fluticasone 2x a day. Productive cough with brown mucus started a month ago   Dyspnea:  Variable doe x 50 min walk including some hills sometimes has to stop up to 10x Cough: minimal light grey baseline  Sleeping: on side/ bed is flat  SABA use: not using  02: none  Covid status:   Vax x 4     No obvious day to day or daytime variability or assoc excess/ purulent sputum or mucus plugs or hemoptysis or cp or chest tightness, subjective wheeze or overt sinus or hb symptoms.   Sleeping  without nocturnal  or early am exacerbation  of respiratory  c/o's or need for noct saba. Also denies any obvious fluctuation of symptoms with weather or environmental changes or other aggravating or alleviating factors except as outlined above   No unusual exposure hx or h/o childhood pna/ asthma or knowledge of premature birth.  Current Allergies, Complete Past Medical History, Past Surgical History, Family History, and Social History were reviewed in [employer] record.  ROS  The following are not active complaints unless bolded Hoarseness, sore throat, dysphagia, dental problems, itching, sneezing,  nasal congestion or discharge of excess mucus or purulent secretions, ear ache,   fever, chills, sweats, unintended wt loss or wt gain, classically pleuritic or exertional cp,  orthopnea pnd or arm/hand swelling  or leg swelling, presyncope, palpitations, abdominal pain, anorexia, nausea, vomiting, diarrhea  or change in bowel habits or change in bladder habits, change in  stools or change in urine, dysuria, hematuria,  rash, arthralgias, visual complaints, headache, numbness, weakness or ataxia or problems with walking or coordination,  change in mood or  memory.        Current Meds  Medication Sig  . buPROPion (  WELLBUTRIN XL) 300 MG 24 hr tablet Take 300 mg by mouth every evening.   . Fluticasone-Salmeterol 113-14 MCG/ACT AEPB Inhale 2 puffs into the lungs 2 (two) times daily.  Marland Kitchen levothyroxine (SYNTHROID, LEVOTHROID) 100 MCG tablet Take 100 mcg by mouth every evening.   . lovastatin (MEVACOR) 20 MG tablet Take 20 mg by mouth at bedtime.   . montelukast (SINGULAIR) 10 MG tablet Take 1 tablet (10 mg total) by mouth at bedtime.  . tamsulosin (FLOMAX) 0.4 MG CAPS capsule Take 0.4 mg by mouth daily after supper.                 Objective:   Physical Exam   11/09/2020        151  09/17/2020        149 10/10/2016        182  03/17/2016        167   01/05/16 159 lb (72.122 kg)  09/01/14 167 lb (75.751 kg)  10/22/12 160 lb (72.576 kg)       Vital signs reviewed  11/09/2020  - Note at rest 02 sats  95% on RA   HEENT : pt wearing mask not removed for exam due to covid -19 concerns.    NECK :  without JVD/Nodes/TM/ nl carotid upstrokes bilaterally   LUNGS: no acc muscle use,  Mod barrel  contour chest wall with bilateral  Distant bs s audible wheeze and  without cough on insp or exp maneuvers and mod  Hyperresonant  to  percussion bilaterally     CV:  RRR  no s3 or murmur or increase in P2, and no edema   ABD:  soft and nontender with pos mid insp Hoover's  in the supine position. No bruits or organomegaly appreciated, bowel sounds nl  MS:     ext warm without deformities, calf tenderness, cyanosis or clubbing No obvious joint restrictions   SKIN: warm and dry without lesions    NEURO:  alert, approp, nl sensorium with  no motor or cerebellar deficits apparent.              Assessment & Plan:

## 2020-11-09 NOTE — Progress Notes (Signed)
Full PFT performed today. °

## 2020-11-10 DIAGNOSIS — C7A8 Other malignant neuroendocrine tumors: Secondary | ICD-10-CM | POA: Diagnosis not present

## 2020-11-10 DIAGNOSIS — Z9689 Presence of other specified functional implants: Secondary | ICD-10-CM | POA: Diagnosis not present

## 2020-11-10 DIAGNOSIS — C7A1 Malignant poorly differentiated neuroendocrine tumors: Secondary | ICD-10-CM | POA: Diagnosis not present

## 2020-11-10 DIAGNOSIS — Z7983 Long term (current) use of bisphosphonates: Secondary | ICD-10-CM | POA: Diagnosis not present

## 2020-11-10 DIAGNOSIS — C7B8 Other secondary neuroendocrine tumors: Secondary | ICD-10-CM | POA: Diagnosis not present

## 2020-11-10 DIAGNOSIS — D696 Thrombocytopenia, unspecified: Secondary | ICD-10-CM | POA: Diagnosis not present

## 2020-11-10 DIAGNOSIS — Z87891 Personal history of nicotine dependence: Secondary | ICD-10-CM | POA: Diagnosis not present

## 2020-11-10 DIAGNOSIS — B370 Candidal stomatitis: Secondary | ICD-10-CM | POA: Diagnosis not present

## 2020-11-11 NOTE — Telephone Encounter (Signed)
MW please advise. Thanks    I spoke too soon when I declined the prescription for oxygen that discussed Tuesday. Can we carry through with that recommendation and what do I need to do to do that? Also, I'm curious re: your recommendation on a pulse versus continuous flow concentrator.  Mackie Pai

## 2020-11-17 DIAGNOSIS — C7A8 Other malignant neuroendocrine tumors: Secondary | ICD-10-CM | POA: Diagnosis not present

## 2020-11-17 DIAGNOSIS — B370 Candidal stomatitis: Secondary | ICD-10-CM | POA: Diagnosis not present

## 2020-11-17 DIAGNOSIS — Z79899 Other long term (current) drug therapy: Secondary | ICD-10-CM | POA: Diagnosis not present

## 2020-11-17 DIAGNOSIS — C7B8 Other secondary neuroendocrine tumors: Secondary | ICD-10-CM | POA: Diagnosis not present

## 2020-11-17 DIAGNOSIS — M25511 Pain in right shoulder: Secondary | ICD-10-CM | POA: Diagnosis not present

## 2020-11-18 DIAGNOSIS — D696 Thrombocytopenia, unspecified: Secondary | ICD-10-CM | POA: Diagnosis not present

## 2020-11-18 DIAGNOSIS — J449 Chronic obstructive pulmonary disease, unspecified: Secondary | ICD-10-CM | POA: Diagnosis not present

## 2020-11-18 DIAGNOSIS — E782 Mixed hyperlipidemia: Secondary | ICD-10-CM | POA: Diagnosis not present

## 2020-11-18 DIAGNOSIS — F330 Major depressive disorder, recurrent, mild: Secondary | ICD-10-CM | POA: Diagnosis not present

## 2020-11-18 DIAGNOSIS — F419 Anxiety disorder, unspecified: Secondary | ICD-10-CM | POA: Diagnosis not present

## 2020-11-18 DIAGNOSIS — E890 Postprocedural hypothyroidism: Secondary | ICD-10-CM | POA: Diagnosis not present

## 2020-11-18 DIAGNOSIS — D3A8 Other benign neuroendocrine tumors: Secondary | ICD-10-CM | POA: Diagnosis not present

## 2020-11-18 NOTE — Telephone Encounter (Signed)
Pt calling to check on that status on whether  MW can put in the referral for him to get a o2 concentrator. Pt can be reached at [phone]

## 2020-11-24 DIAGNOSIS — B370 Candidal stomatitis: Secondary | ICD-10-CM | POA: Diagnosis not present

## 2020-11-24 DIAGNOSIS — D696 Thrombocytopenia, unspecified: Secondary | ICD-10-CM | POA: Diagnosis not present

## 2020-11-24 DIAGNOSIS — C7A8 Other malignant neuroendocrine tumors: Secondary | ICD-10-CM | POA: Diagnosis not present

## 2020-11-24 DIAGNOSIS — M25511 Pain in right shoulder: Secondary | ICD-10-CM | POA: Diagnosis not present

## 2020-11-24 DIAGNOSIS — Z79899 Other long term (current) drug therapy: Secondary | ICD-10-CM | POA: Diagnosis not present

## 2020-11-24 DIAGNOSIS — C7B8 Other secondary neuroendocrine tumors: Secondary | ICD-10-CM | POA: Diagnosis not present

## 2020-11-24 DIAGNOSIS — K0889 Other specified disorders of teeth and supporting structures: Secondary | ICD-10-CM | POA: Diagnosis not present

## 2020-11-30 ENCOUNTER — Telehealth: Payer: Self-pay | Admitting: Internal Medicine

## 2020-11-30 DIAGNOSIS — R0609 Other forms of dyspnea: Secondary | ICD-10-CM

## 2020-11-30 DIAGNOSIS — R0600 Dyspnea, unspecified: Secondary | ICD-10-CM

## 2020-11-30 NOTE — Telephone Encounter (Signed)
Ok to order per original plan

## 2020-11-30 NOTE — Telephone Encounter (Signed)
I called and spoke with patient regarding O2 order. It looks like he sent a mychart message last week as patient changed his mind and is wanting to use O2 now. I cannot see where Dr. Melvyn Novas responded who an O2 order was sent in. Will route to Dr. Melvyn Novas and let patient know.  Dr. Melvyn Novas, please advise if its ok to send in O2 order. Thanks!

## 2020-11-30 NOTE — Telephone Encounter (Signed)
Order has been placed to get the pt set up with the oxygen. I  Have called the pt and he is aware.  Nothing further is needed.

## 2020-12-08 DIAGNOSIS — C7A020 Malignant carcinoid tumor of the appendix: Secondary | ICD-10-CM | POA: Diagnosis not present

## 2020-12-08 DIAGNOSIS — Z79899 Other long term (current) drug therapy: Secondary | ICD-10-CM | POA: Diagnosis not present

## 2020-12-08 DIAGNOSIS — C7B8 Other secondary neuroendocrine tumors: Secondary | ICD-10-CM | POA: Diagnosis not present

## 2020-12-08 DIAGNOSIS — C786 Secondary malignant neoplasm of retroperitoneum and peritoneum: Secondary | ICD-10-CM | POA: Diagnosis not present

## 2020-12-08 DIAGNOSIS — C7951 Secondary malignant neoplasm of bone: Secondary | ICD-10-CM | POA: Diagnosis not present

## 2020-12-08 DIAGNOSIS — C7A8 Other malignant neuroendocrine tumors: Secondary | ICD-10-CM | POA: Diagnosis not present

## 2020-12-08 DIAGNOSIS — C7989 Secondary malignant neoplasm of other specified sites: Secondary | ICD-10-CM | POA: Diagnosis not present

## 2020-12-15 DIAGNOSIS — C7A025 Malignant carcinoid tumor of the sigmoid colon: Secondary | ICD-10-CM | POA: Diagnosis not present

## 2020-12-15 DIAGNOSIS — Z87891 Personal history of nicotine dependence: Secondary | ICD-10-CM | POA: Diagnosis not present

## 2020-12-15 DIAGNOSIS — C7A8 Other malignant neuroendocrine tumors: Secondary | ICD-10-CM | POA: Diagnosis not present

## 2020-12-15 DIAGNOSIS — Z79899 Other long term (current) drug therapy: Secondary | ICD-10-CM | POA: Diagnosis not present

## 2020-12-15 DIAGNOSIS — C7B04 Secondary carcinoid tumors of peritoneum: Secondary | ICD-10-CM | POA: Diagnosis not present

## 2020-12-15 DIAGNOSIS — C7B8 Other secondary neuroendocrine tumors: Secondary | ICD-10-CM | POA: Diagnosis not present

## 2020-12-15 DIAGNOSIS — C7989 Secondary malignant neoplasm of other specified sites: Secondary | ICD-10-CM | POA: Diagnosis not present

## 2020-12-15 DIAGNOSIS — C7951 Secondary malignant neoplasm of bone: Secondary | ICD-10-CM | POA: Diagnosis not present

## 2020-12-15 DIAGNOSIS — Z9221 Personal history of antineoplastic chemotherapy: Secondary | ICD-10-CM | POA: Diagnosis not present

## 2020-12-24 DIAGNOSIS — R1084 Generalized abdominal pain: Secondary | ICD-10-CM | POA: Diagnosis not present

## 2020-12-24 DIAGNOSIS — J302 Other seasonal allergic rhinitis: Secondary | ICD-10-CM | POA: Diagnosis not present

## 2020-12-24 DIAGNOSIS — C7951 Secondary malignant neoplasm of bone: Secondary | ICD-10-CM | POA: Diagnosis not present

## 2020-12-24 DIAGNOSIS — Z87891 Personal history of nicotine dependence: Secondary | ICD-10-CM | POA: Diagnosis not present

## 2020-12-24 DIAGNOSIS — C786 Secondary malignant neoplasm of retroperitoneum and peritoneum: Secondary | ICD-10-CM | POA: Diagnosis not present

## 2020-12-24 DIAGNOSIS — J449 Chronic obstructive pulmonary disease, unspecified: Secondary | ICD-10-CM | POA: Diagnosis not present

## 2020-12-24 DIAGNOSIS — N400 Enlarged prostate without lower urinary tract symptoms: Secondary | ICD-10-CM | POA: Diagnosis not present

## 2020-12-24 DIAGNOSIS — E039 Hypothyroidism, unspecified: Secondary | ICD-10-CM | POA: Diagnosis not present

## 2020-12-24 DIAGNOSIS — F32A Depression, unspecified: Secondary | ICD-10-CM | POA: Diagnosis not present

## 2020-12-24 DIAGNOSIS — E785 Hyperlipidemia, unspecified: Secondary | ICD-10-CM | POA: Diagnosis not present

## 2020-12-24 DIAGNOSIS — R0600 Dyspnea, unspecified: Secondary | ICD-10-CM | POA: Diagnosis not present

## 2020-12-24 DIAGNOSIS — C7A8 Other malignant neuroendocrine tumors: Secondary | ICD-10-CM | POA: Diagnosis not present

## 2020-12-24 DIAGNOSIS — C7880 Secondary malignant neoplasm of unspecified digestive organ: Secondary | ICD-10-CM | POA: Diagnosis not present

## 2020-12-24 DIAGNOSIS — C787 Secondary malignant neoplasm of liver and intrahepatic bile duct: Secondary | ICD-10-CM | POA: Diagnosis not present

## 2020-12-27 DIAGNOSIS — C787 Secondary malignant neoplasm of liver and intrahepatic bile duct: Secondary | ICD-10-CM | POA: Diagnosis not present

## 2020-12-27 DIAGNOSIS — C7951 Secondary malignant neoplasm of bone: Secondary | ICD-10-CM | POA: Diagnosis not present

## 2020-12-27 DIAGNOSIS — R0600 Dyspnea, unspecified: Secondary | ICD-10-CM | POA: Diagnosis not present

## 2020-12-27 DIAGNOSIS — C786 Secondary malignant neoplasm of retroperitoneum and peritoneum: Secondary | ICD-10-CM | POA: Diagnosis not present

## 2020-12-27 DIAGNOSIS — C7880 Secondary malignant neoplasm of unspecified digestive organ: Secondary | ICD-10-CM | POA: Diagnosis not present

## 2020-12-27 DIAGNOSIS — C7A8 Other malignant neuroendocrine tumors: Secondary | ICD-10-CM | POA: Diagnosis not present

## 2020-12-31 DIAGNOSIS — R1084 Generalized abdominal pain: Secondary | ICD-10-CM | POA: Diagnosis not present

## 2020-12-31 DIAGNOSIS — J302 Other seasonal allergic rhinitis: Secondary | ICD-10-CM | POA: Diagnosis not present

## 2020-12-31 DIAGNOSIS — E785 Hyperlipidemia, unspecified: Secondary | ICD-10-CM | POA: Diagnosis not present

## 2020-12-31 DIAGNOSIS — C786 Secondary malignant neoplasm of retroperitoneum and peritoneum: Secondary | ICD-10-CM | POA: Diagnosis not present

## 2020-12-31 DIAGNOSIS — F32A Depression, unspecified: Secondary | ICD-10-CM | POA: Diagnosis not present

## 2020-12-31 DIAGNOSIS — R0600 Dyspnea, unspecified: Secondary | ICD-10-CM | POA: Diagnosis not present

## 2020-12-31 DIAGNOSIS — E039 Hypothyroidism, unspecified: Secondary | ICD-10-CM | POA: Diagnosis not present

## 2020-12-31 DIAGNOSIS — C7880 Secondary malignant neoplasm of unspecified digestive organ: Secondary | ICD-10-CM | POA: Diagnosis not present

## 2020-12-31 DIAGNOSIS — N400 Enlarged prostate without lower urinary tract symptoms: Secondary | ICD-10-CM | POA: Diagnosis not present

## 2020-12-31 DIAGNOSIS — J449 Chronic obstructive pulmonary disease, unspecified: Secondary | ICD-10-CM | POA: Diagnosis not present

## 2020-12-31 DIAGNOSIS — C7A8 Other malignant neuroendocrine tumors: Secondary | ICD-10-CM | POA: Diagnosis not present

## 2020-12-31 DIAGNOSIS — C787 Secondary malignant neoplasm of liver and intrahepatic bile duct: Secondary | ICD-10-CM | POA: Diagnosis not present

## 2020-12-31 DIAGNOSIS — C7951 Secondary malignant neoplasm of bone: Secondary | ICD-10-CM | POA: Diagnosis not present

## 2020-12-31 DIAGNOSIS — Z87891 Personal history of nicotine dependence: Secondary | ICD-10-CM | POA: Diagnosis not present

## 2021-01-12 DIAGNOSIS — C787 Secondary malignant neoplasm of liver and intrahepatic bile duct: Secondary | ICD-10-CM | POA: Diagnosis not present

## 2021-01-12 DIAGNOSIS — C7A8 Other malignant neuroendocrine tumors: Secondary | ICD-10-CM | POA: Diagnosis not present

## 2021-01-12 DIAGNOSIS — R0600 Dyspnea, unspecified: Secondary | ICD-10-CM | POA: Diagnosis not present

## 2021-01-12 DIAGNOSIS — C7951 Secondary malignant neoplasm of bone: Secondary | ICD-10-CM | POA: Diagnosis not present

## 2021-01-12 DIAGNOSIS — C786 Secondary malignant neoplasm of retroperitoneum and peritoneum: Secondary | ICD-10-CM | POA: Diagnosis not present

## 2021-01-12 DIAGNOSIS — C7880 Secondary malignant neoplasm of unspecified digestive organ: Secondary | ICD-10-CM | POA: Diagnosis not present

## 2021-01-18 ENCOUNTER — Telehealth: Payer: Self-pay | Admitting: Radiation Oncology

## 2021-01-18 DIAGNOSIS — Z20822 Contact with and (suspected) exposure to covid-19: Secondary | ICD-10-CM | POA: Diagnosis not present

## 2021-01-19 ENCOUNTER — Encounter: Payer: Self-pay | Admitting: Radiation Oncology

## 2021-01-19 ENCOUNTER — Other Ambulatory Visit: Payer: Self-pay

## 2021-01-19 ENCOUNTER — Ambulatory Visit
Admission: RE | Admit: 2021-01-19 | Discharge: 2021-01-19 | Disposition: A | Discharge status: Home / Self Care | Payer: Medicare Other | Source: Ambulatory Visit | Attending: Radiation Oncology | Admitting: Radiation Oncology

## 2021-01-19 VITALS — Ht 69.0 in | Wt 154.0 lb

## 2021-01-19 DIAGNOSIS — C7A8 Other malignant neuroendocrine tumors: Secondary | ICD-10-CM | POA: Diagnosis not present

## 2021-01-19 DIAGNOSIS — C7B8 Other secondary neuroendocrine tumors: Secondary | ICD-10-CM | POA: Diagnosis not present

## 2021-01-19 DIAGNOSIS — C7951 Secondary malignant neoplasm of bone: Secondary | ICD-10-CM
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
## 2021-01-19 NOTE — Progress Notes (Signed)
Histology and Location of Primary Cancer: [place]  Sites of Visceral and Bony Metastatic Disease: Right Shoulder   Past/Anticipated chemotherapy by medical oncology, if any:  Dr. Dorene Grebe -Stage IV neuroendocrine tumor, grade 2, of GI tract with peritoneal/diaphragmatic/skeletal, and possible soft tissue metastases:  -Status post Lutathera. Had progression. Intolerant to Afinitor.  -Try dose adjusted Temodar and Xeloda. Patient did not tolerate well and decided to stop systemic therapy altogether. Stop lanreotide infusion. -Discussed the prognosis. Discussed palliative care/hospice.  -Patient was understanding and would like to proceed with hospice. Referred hospice. Discussed goals of care and CODE STATUS. -Discussed about the full code versus full support DNR. Patient would like to make a decision later and discussed with the hospice team. He Denied CTA previously for further evaluation of dyspnea. -Advised him to limit upper arm exercise to not to exacerbate the shoulder pain.   Pain on a scale of 0-10 is:  2/10 right now, he took Oxycodone 5 mg 2 hours ago.  4/10 without pain medication.  ROM: Notes ROM remains intact, no weakness noted.    Ambulatory status? Walker? Wheelchair?: Ambulatory  SAFETY ISSUES: Prior radiation? No Pacemaker/ICD? No Possible current pregnancy? N/a Is the patient on methotrexate? No  Current Complaints / other details:

## 2021-01-20 ENCOUNTER — Other Ambulatory Visit: Payer: Self-pay

## 2021-01-20 ENCOUNTER — Other Ambulatory Visit: Payer: Self-pay | Admitting: Radiation Oncology

## 2021-01-20 ENCOUNTER — Telehealth: Payer: Self-pay | Admitting: Radiation Oncology

## 2021-01-20 ENCOUNTER — Encounter: Payer: Self-pay | Admitting: Radiation Oncology

## 2021-01-20 DIAGNOSIS — C7951 Secondary malignant neoplasm of bone: Secondary | ICD-10-CM

## 2021-01-20 NOTE — Telephone Encounter (Signed)
I called and left a message for the patient to let him know his PET scan did not show other sites that Dr. Lisbeth Renshaw thought would be benefited by radiation other than the right shoulder. He will come in on Thursday next week for simulation with a clinical set up and PET fusion.

## 2021-01-21 DIAGNOSIS — C7951 Secondary malignant neoplasm of bone: Secondary | ICD-10-CM | POA: Diagnosis not present

## 2021-01-21 DIAGNOSIS — R0600 Dyspnea, unspecified: Secondary | ICD-10-CM | POA: Diagnosis not present

## 2021-01-21 DIAGNOSIS — C787 Secondary malignant neoplasm of liver and intrahepatic bile duct: Secondary | ICD-10-CM | POA: Diagnosis not present

## 2021-01-21 DIAGNOSIS — C786 Secondary malignant neoplasm of retroperitoneum and peritoneum: Secondary | ICD-10-CM | POA: Diagnosis not present

## 2021-01-21 DIAGNOSIS — C7A8 Other malignant neuroendocrine tumors: Secondary | ICD-10-CM | POA: Diagnosis not present

## 2021-01-21 DIAGNOSIS — C7880 Secondary malignant neoplasm of unspecified digestive organ: Secondary | ICD-10-CM | POA: Diagnosis not present

## 2021-01-26 DIAGNOSIS — C786 Secondary malignant neoplasm of retroperitoneum and peritoneum: Secondary | ICD-10-CM | POA: Diagnosis not present

## 2021-01-26 DIAGNOSIS — C787 Secondary malignant neoplasm of liver and intrahepatic bile duct: Secondary | ICD-10-CM | POA: Diagnosis not present

## 2021-01-26 DIAGNOSIS — C7A8 Other malignant neuroendocrine tumors: Secondary | ICD-10-CM | POA: Diagnosis not present

## 2021-01-26 DIAGNOSIS — C7880 Secondary malignant neoplasm of unspecified digestive organ: Secondary | ICD-10-CM | POA: Diagnosis not present

## 2021-01-26 DIAGNOSIS — R0600 Dyspnea, unspecified: Secondary | ICD-10-CM | POA: Diagnosis not present

## 2021-01-26 DIAGNOSIS — C7951 Secondary malignant neoplasm of bone: Secondary | ICD-10-CM | POA: Diagnosis not present

## 2021-01-27 ENCOUNTER — Ambulatory Visit
Admission: RE | Admit: 2021-01-27 | Discharge: 2021-01-27 | Disposition: A | Discharge status: Home / Self Care | Source: Ambulatory Visit | Attending: Radiation Oncology | Admitting: Radiation Oncology

## 2021-01-27 ENCOUNTER — Other Ambulatory Visit: Payer: Self-pay

## 2021-01-27 DIAGNOSIS — C7A8 Other malignant neuroendocrine tumors: Secondary | ICD-10-CM | POA: Diagnosis not present

## 2021-01-27 DIAGNOSIS — C7951 Secondary malignant neoplasm of bone: Secondary | ICD-10-CM | POA: Insufficient documentation

## 2021-01-27 DIAGNOSIS — C7B8 Other secondary neuroendocrine tumors: Secondary | ICD-10-CM | POA: Diagnosis not present

## 2021-01-31 DIAGNOSIS — J302 Other seasonal allergic rhinitis: Secondary | ICD-10-CM | POA: Diagnosis not present

## 2021-01-31 DIAGNOSIS — C787 Secondary malignant neoplasm of liver and intrahepatic bile duct: Secondary | ICD-10-CM | POA: Diagnosis not present

## 2021-01-31 DIAGNOSIS — E039 Hypothyroidism, unspecified: Secondary | ICD-10-CM | POA: Diagnosis not present

## 2021-01-31 DIAGNOSIS — F32A Depression, unspecified: Secondary | ICD-10-CM | POA: Diagnosis not present

## 2021-01-31 DIAGNOSIS — C7880 Secondary malignant neoplasm of unspecified digestive organ: Secondary | ICD-10-CM | POA: Diagnosis not present

## 2021-01-31 DIAGNOSIS — Z87891 Personal history of nicotine dependence: Secondary | ICD-10-CM | POA: Diagnosis not present

## 2021-01-31 DIAGNOSIS — R1084 Generalized abdominal pain: Secondary | ICD-10-CM | POA: Diagnosis not present

## 2021-01-31 DIAGNOSIS — E785 Hyperlipidemia, unspecified: Secondary | ICD-10-CM | POA: Diagnosis not present

## 2021-01-31 DIAGNOSIS — C7951 Secondary malignant neoplasm of bone: Secondary | ICD-10-CM | POA: Diagnosis not present

## 2021-01-31 DIAGNOSIS — R0600 Dyspnea, unspecified: Secondary | ICD-10-CM | POA: Diagnosis not present

## 2021-01-31 DIAGNOSIS — C786 Secondary malignant neoplasm of retroperitoneum and peritoneum: Secondary | ICD-10-CM | POA: Diagnosis not present

## 2021-01-31 DIAGNOSIS — N400 Enlarged prostate without lower urinary tract symptoms: Secondary | ICD-10-CM | POA: Diagnosis not present

## 2021-01-31 DIAGNOSIS — J449 Chronic obstructive pulmonary disease, unspecified: Secondary | ICD-10-CM | POA: Diagnosis not present

## 2021-01-31 DIAGNOSIS — C7A8 Other malignant neuroendocrine tumors: Secondary | ICD-10-CM | POA: Diagnosis not present

## 2021-02-01 ENCOUNTER — Ambulatory Visit

## 2021-02-02 ENCOUNTER — Other Ambulatory Visit: Payer: Self-pay

## 2021-02-02 ENCOUNTER — Ambulatory Visit

## 2021-02-02 ENCOUNTER — Ambulatory Visit
Admission: RE | Admit: 2021-02-02 | Discharge: 2021-02-02 | Disposition: A | Discharge status: Home / Self Care | Source: Ambulatory Visit | Attending: Radiation Oncology | Admitting: Radiation Oncology

## 2021-02-02 DIAGNOSIS — C7B8 Other secondary neuroendocrine tumors: Secondary | ICD-10-CM | POA: Diagnosis not present

## 2021-02-02 DIAGNOSIS — C7A8 Other malignant neuroendocrine tumors: Secondary | ICD-10-CM | POA: Diagnosis not present

## 2021-02-02 DIAGNOSIS — Z510 Encounter for antineoplastic radiation therapy: Secondary | ICD-10-CM | POA: Insufficient documentation

## 2021-02-02 DIAGNOSIS — C7951 Secondary malignant neoplasm of bone: Secondary | ICD-10-CM | POA: Diagnosis not present

## 2021-02-03 ENCOUNTER — Ambulatory Visit
Admission: RE | Admit: 2021-02-03 | Discharge: 2021-02-03 | Disposition: A | Discharge status: Home / Self Care | Source: Ambulatory Visit | Attending: Radiation Oncology | Admitting: Radiation Oncology

## 2021-02-03 DIAGNOSIS — Z510 Encounter for antineoplastic radiation therapy: Secondary | ICD-10-CM | POA: Diagnosis not present

## 2021-02-04 ENCOUNTER — Other Ambulatory Visit: Payer: Self-pay

## 2021-02-04 ENCOUNTER — Ambulatory Visit
Admission: RE | Admit: 2021-02-04 | Discharge: 2021-02-04 | Disposition: A | Discharge status: Home / Self Care | Source: Ambulatory Visit | Attending: Radiation Oncology | Admitting: Radiation Oncology

## 2021-02-04 DIAGNOSIS — Z510 Encounter for antineoplastic radiation therapy: Secondary | ICD-10-CM | POA: Diagnosis not present

## 2021-02-07 ENCOUNTER — Other Ambulatory Visit: Payer: Self-pay

## 2021-02-07 ENCOUNTER — Ambulatory Visit
Admission: RE | Admit: 2021-02-07 | Discharge: 2021-02-07 | Disposition: A | Discharge status: Home / Self Care | Source: Ambulatory Visit | Attending: Radiation Oncology | Admitting: Radiation Oncology

## 2021-02-07 DIAGNOSIS — Z510 Encounter for antineoplastic radiation therapy: Secondary | ICD-10-CM | POA: Diagnosis not present

## 2021-02-08 ENCOUNTER — Encounter: Payer: Self-pay | Admitting: Radiation Oncology

## 2021-02-08 ENCOUNTER — Ambulatory Visit
Admission: RE | Admit: 2021-02-08 | Discharge: 2021-02-08 | Disposition: A | Discharge status: Home / Self Care | Source: Ambulatory Visit | Attending: Radiation Oncology | Admitting: Radiation Oncology

## 2021-02-08 DIAGNOSIS — C7B8 Other secondary neuroendocrine tumors: Secondary | ICD-10-CM | POA: Diagnosis not present

## 2021-02-08 DIAGNOSIS — C7A8 Other malignant neuroendocrine tumors: Secondary | ICD-10-CM | POA: Diagnosis not present

## 2021-02-08 DIAGNOSIS — C7951 Secondary malignant neoplasm of bone: Secondary | ICD-10-CM | POA: Diagnosis not present

## 2021-02-08 DIAGNOSIS — Z510 Encounter for antineoplastic radiation therapy: Secondary | ICD-10-CM | POA: Diagnosis not present

## 2021-02-09 DIAGNOSIS — C787 Secondary malignant neoplasm of liver and intrahepatic bile duct: Secondary | ICD-10-CM | POA: Diagnosis not present

## 2021-02-09 DIAGNOSIS — C7A8 Other malignant neuroendocrine tumors: Secondary | ICD-10-CM | POA: Diagnosis not present

## 2021-02-09 DIAGNOSIS — C7951 Secondary malignant neoplasm of bone: Secondary | ICD-10-CM | POA: Diagnosis not present

## 2021-02-09 DIAGNOSIS — C7880 Secondary malignant neoplasm of unspecified digestive organ: Secondary | ICD-10-CM | POA: Diagnosis not present

## 2021-02-09 DIAGNOSIS — R0600 Dyspnea, unspecified: Secondary | ICD-10-CM | POA: Diagnosis not present

## 2021-02-09 DIAGNOSIS — C786 Secondary malignant neoplasm of retroperitoneum and peritoneum: Secondary | ICD-10-CM | POA: Diagnosis not present

## 2021-02-11 DIAGNOSIS — C786 Secondary malignant neoplasm of retroperitoneum and peritoneum: Secondary | ICD-10-CM | POA: Diagnosis not present

## 2021-02-11 DIAGNOSIS — R0600 Dyspnea, unspecified: Secondary | ICD-10-CM | POA: Diagnosis not present

## 2021-02-11 DIAGNOSIS — C787 Secondary malignant neoplasm of liver and intrahepatic bile duct: Secondary | ICD-10-CM | POA: Diagnosis not present

## 2021-02-11 DIAGNOSIS — C7951 Secondary malignant neoplasm of bone: Secondary | ICD-10-CM | POA: Diagnosis not present

## 2021-02-11 DIAGNOSIS — C7880 Secondary malignant neoplasm of unspecified digestive organ: Secondary | ICD-10-CM | POA: Diagnosis not present

## 2021-02-11 DIAGNOSIS — C7A8 Other malignant neuroendocrine tumors: Secondary | ICD-10-CM | POA: Diagnosis not present

## 2021-02-21 NOTE — Progress Notes (Signed)
  Radiation Oncology         [phone]) [phone] ________________________________  Name: Mark Giles MRN: [id number]  Date: 02/08/2021  DOB: August 19, 1945  End of Treatment Note  Diagnosis:     Progressive metastatic neuroendocrine carcinoma of the appendix with multiple nodal, testicular, bone, peritoneal, and liver disease     Indication for treatment:  palliative       Radiation treatment dates:   02/02/21-02/08/21  Site/dose:   The left shoulder was treated to 20 Gy in 5 fractions.  Narrative: The patient tolerated radiation treatment relatively well.     Plan: The patient will follow up with outpatient hospice care.The patient had been encouraged to call with questions at the conclusion of treatment.      Carola Rhine, PAC

## 2021-02-23 DIAGNOSIS — C787 Secondary malignant neoplasm of liver and intrahepatic bile duct: Secondary | ICD-10-CM | POA: Diagnosis not present

## 2021-02-23 DIAGNOSIS — C7880 Secondary malignant neoplasm of unspecified digestive organ: Secondary | ICD-10-CM | POA: Diagnosis not present

## 2021-02-23 DIAGNOSIS — R0600 Dyspnea, unspecified: Secondary | ICD-10-CM | POA: Diagnosis not present

## 2021-02-23 DIAGNOSIS — C7951 Secondary malignant neoplasm of bone: Secondary | ICD-10-CM | POA: Diagnosis not present

## 2021-02-23 DIAGNOSIS — C7A8 Other malignant neuroendocrine tumors: Secondary | ICD-10-CM | POA: Diagnosis not present

## 2021-02-23 DIAGNOSIS — C786 Secondary malignant neoplasm of retroperitoneum and peritoneum: Secondary | ICD-10-CM | POA: Diagnosis not present

## 2021-03-03 DIAGNOSIS — C7951 Secondary malignant neoplasm of bone: Secondary | ICD-10-CM | POA: Diagnosis not present

## 2021-03-03 DIAGNOSIS — R0600 Dyspnea, unspecified: Secondary | ICD-10-CM | POA: Diagnosis not present

## 2021-03-03 DIAGNOSIS — C786 Secondary malignant neoplasm of retroperitoneum and peritoneum: Secondary | ICD-10-CM | POA: Diagnosis not present

## 2021-03-03 DIAGNOSIS — E039 Hypothyroidism, unspecified: Secondary | ICD-10-CM | POA: Diagnosis not present

## 2021-03-03 DIAGNOSIS — J302 Other seasonal allergic rhinitis: Secondary | ICD-10-CM | POA: Diagnosis not present

## 2021-03-03 DIAGNOSIS — C7A8 Other malignant neuroendocrine tumors: Secondary | ICD-10-CM | POA: Diagnosis not present

## 2021-03-03 DIAGNOSIS — R1084 Generalized abdominal pain: Secondary | ICD-10-CM | POA: Diagnosis not present

## 2021-03-03 DIAGNOSIS — N400 Enlarged prostate without lower urinary tract symptoms: Secondary | ICD-10-CM | POA: Diagnosis not present

## 2021-03-03 DIAGNOSIS — C787 Secondary malignant neoplasm of liver and intrahepatic bile duct: Secondary | ICD-10-CM | POA: Diagnosis not present

## 2021-03-03 DIAGNOSIS — E785 Hyperlipidemia, unspecified: Secondary | ICD-10-CM | POA: Diagnosis not present

## 2021-03-03 DIAGNOSIS — F32A Depression, unspecified: Secondary | ICD-10-CM | POA: Diagnosis not present

## 2021-03-03 DIAGNOSIS — Z87891 Personal history of nicotine dependence: Secondary | ICD-10-CM | POA: Diagnosis not present

## 2021-03-03 DIAGNOSIS — J449 Chronic obstructive pulmonary disease, unspecified: Secondary | ICD-10-CM | POA: Diagnosis not present

## 2021-03-03 DIAGNOSIS — C7880 Secondary malignant neoplasm of unspecified digestive organ: Secondary | ICD-10-CM | POA: Diagnosis not present

## 2021-03-09 DIAGNOSIS — C7951 Secondary malignant neoplasm of bone: Secondary | ICD-10-CM | POA: Diagnosis not present

## 2021-03-09 DIAGNOSIS — C7A8 Other malignant neuroendocrine tumors: Secondary | ICD-10-CM | POA: Diagnosis not present

## 2021-03-09 DIAGNOSIS — C786 Secondary malignant neoplasm of retroperitoneum and peritoneum: Secondary | ICD-10-CM | POA: Diagnosis not present

## 2021-03-09 DIAGNOSIS — R0600 Dyspnea, unspecified: Secondary | ICD-10-CM | POA: Diagnosis not present

## 2021-03-09 DIAGNOSIS — C787 Secondary malignant neoplasm of liver and intrahepatic bile duct: Secondary | ICD-10-CM | POA: Diagnosis not present

## 2021-03-09 DIAGNOSIS — C7880 Secondary malignant neoplasm of unspecified digestive organ: Secondary | ICD-10-CM | POA: Diagnosis not present

## 2021-03-22 DIAGNOSIS — C7951 Secondary malignant neoplasm of bone: Secondary | ICD-10-CM | POA: Diagnosis not present

## 2021-03-22 DIAGNOSIS — C7880 Secondary malignant neoplasm of unspecified digestive organ: Secondary | ICD-10-CM | POA: Diagnosis not present

## 2021-03-22 DIAGNOSIS — C7A8 Other malignant neuroendocrine tumors: Secondary | ICD-10-CM | POA: Diagnosis not present

## 2021-03-22 DIAGNOSIS — R0600 Dyspnea, unspecified: Secondary | ICD-10-CM | POA: Diagnosis not present

## 2021-03-22 DIAGNOSIS — C787 Secondary malignant neoplasm of liver and intrahepatic bile duct: Secondary | ICD-10-CM | POA: Diagnosis not present

## 2021-03-22 DIAGNOSIS — C786 Secondary malignant neoplasm of retroperitoneum and peritoneum: Secondary | ICD-10-CM | POA: Diagnosis not present

## 2021-04-01 DIAGNOSIS — C787 Secondary malignant neoplasm of liver and intrahepatic bile duct: Secondary | ICD-10-CM | POA: Diagnosis not present

## 2021-04-01 DIAGNOSIS — C7A8 Other malignant neuroendocrine tumors: Secondary | ICD-10-CM | POA: Diagnosis not present

## 2021-04-01 DIAGNOSIS — R0600 Dyspnea, unspecified: Secondary | ICD-10-CM | POA: Diagnosis not present

## 2021-04-01 DIAGNOSIS — C7951 Secondary malignant neoplasm of bone: Secondary | ICD-10-CM | POA: Diagnosis not present

## 2021-04-01 DIAGNOSIS — C786 Secondary malignant neoplasm of retroperitoneum and peritoneum: Secondary | ICD-10-CM | POA: Diagnosis not present

## 2021-04-01 DIAGNOSIS — C7880 Secondary malignant neoplasm of unspecified digestive organ: Secondary | ICD-10-CM | POA: Diagnosis not present

## 2021-04-02 DIAGNOSIS — C7951 Secondary malignant neoplasm of bone: Secondary | ICD-10-CM | POA: Diagnosis not present

## 2021-04-02 DIAGNOSIS — J302 Other seasonal allergic rhinitis: Secondary | ICD-10-CM | POA: Diagnosis not present

## 2021-04-02 DIAGNOSIS — E039 Hypothyroidism, unspecified: Secondary | ICD-10-CM | POA: Diagnosis not present

## 2021-04-02 DIAGNOSIS — F32A Depression, unspecified: Secondary | ICD-10-CM | POA: Diagnosis not present

## 2021-04-02 DIAGNOSIS — N400 Enlarged prostate without lower urinary tract symptoms: Secondary | ICD-10-CM | POA: Diagnosis not present

## 2021-04-02 DIAGNOSIS — E785 Hyperlipidemia, unspecified: Secondary | ICD-10-CM | POA: Diagnosis not present

## 2021-04-02 DIAGNOSIS — R1084 Generalized abdominal pain: Secondary | ICD-10-CM | POA: Diagnosis not present

## 2021-04-02 DIAGNOSIS — C787 Secondary malignant neoplasm of liver and intrahepatic bile duct: Secondary | ICD-10-CM | POA: Diagnosis not present

## 2021-04-02 DIAGNOSIS — R0600 Dyspnea, unspecified: Secondary | ICD-10-CM | POA: Diagnosis not present

## 2021-04-02 DIAGNOSIS — C7880 Secondary malignant neoplasm of unspecified digestive organ: Secondary | ICD-10-CM | POA: Diagnosis not present

## 2021-04-02 DIAGNOSIS — C786 Secondary malignant neoplasm of retroperitoneum and peritoneum: Secondary | ICD-10-CM | POA: Diagnosis not present

## 2021-04-02 DIAGNOSIS — J449 Chronic obstructive pulmonary disease, unspecified: Secondary | ICD-10-CM | POA: Diagnosis not present

## 2021-04-02 DIAGNOSIS — Z87891 Personal history of nicotine dependence: Secondary | ICD-10-CM | POA: Diagnosis not present

## 2021-04-02 DIAGNOSIS — C7A8 Other malignant neuroendocrine tumors: Secondary | ICD-10-CM | POA: Diagnosis not present

## 2021-04-05 DIAGNOSIS — E890 Postprocedural hypothyroidism: Secondary | ICD-10-CM | POA: Diagnosis not present

## 2021-04-05 DIAGNOSIS — F330 Major depressive disorder, recurrent, mild: Secondary | ICD-10-CM | POA: Diagnosis not present

## 2021-04-05 DIAGNOSIS — R0600 Dyspnea, unspecified: Secondary | ICD-10-CM | POA: Diagnosis not present

## 2021-04-05 DIAGNOSIS — C7880 Secondary malignant neoplasm of unspecified digestive organ: Secondary | ICD-10-CM | POA: Diagnosis not present

## 2021-04-05 DIAGNOSIS — Z1389 Encounter for screening for other disorder: Secondary | ICD-10-CM | POA: Diagnosis not present

## 2021-04-05 DIAGNOSIS — F419 Anxiety disorder, unspecified: Secondary | ICD-10-CM | POA: Diagnosis not present

## 2021-04-05 DIAGNOSIS — J449 Chronic obstructive pulmonary disease, unspecified: Secondary | ICD-10-CM | POA: Diagnosis not present

## 2021-04-05 DIAGNOSIS — E782 Mixed hyperlipidemia: Secondary | ICD-10-CM | POA: Diagnosis not present

## 2021-04-05 DIAGNOSIS — C787 Secondary malignant neoplasm of liver and intrahepatic bile duct: Secondary | ICD-10-CM | POA: Diagnosis not present

## 2021-04-05 DIAGNOSIS — R21 Rash and other nonspecific skin eruption: Secondary | ICD-10-CM | POA: Diagnosis not present

## 2021-04-05 DIAGNOSIS — C7951 Secondary malignant neoplasm of bone: Secondary | ICD-10-CM | POA: Diagnosis not present

## 2021-04-05 DIAGNOSIS — C786 Secondary malignant neoplasm of retroperitoneum and peritoneum: Secondary | ICD-10-CM | POA: Diagnosis not present

## 2021-04-05 DIAGNOSIS — C7A8 Other malignant neuroendocrine tumors: Secondary | ICD-10-CM | POA: Diagnosis not present

## 2021-04-05 DIAGNOSIS — N400 Enlarged prostate without lower urinary tract symptoms: Secondary | ICD-10-CM | POA: Diagnosis not present

## 2021-04-05 DIAGNOSIS — Z0000 Encounter for general adult medical examination without abnormal findings: Secondary | ICD-10-CM | POA: Diagnosis not present

## 2021-04-05 DIAGNOSIS — D696 Thrombocytopenia, unspecified: Secondary | ICD-10-CM | POA: Diagnosis not present

## 2021-04-05 DIAGNOSIS — D3A8 Other benign neuroendocrine tumors: Secondary | ICD-10-CM | POA: Diagnosis not present

## 2021-04-06 DIAGNOSIS — C7951 Secondary malignant neoplasm of bone: Secondary | ICD-10-CM | POA: Diagnosis not present

## 2021-04-06 DIAGNOSIS — C7880 Secondary malignant neoplasm of unspecified digestive organ: Secondary | ICD-10-CM | POA: Diagnosis not present

## 2021-04-06 DIAGNOSIS — R0600 Dyspnea, unspecified: Secondary | ICD-10-CM | POA: Diagnosis not present

## 2021-04-06 DIAGNOSIS — C787 Secondary malignant neoplasm of liver and intrahepatic bile duct: Secondary | ICD-10-CM | POA: Diagnosis not present

## 2021-04-06 DIAGNOSIS — C7A8 Other malignant neuroendocrine tumors: Secondary | ICD-10-CM | POA: Diagnosis not present

## 2021-04-06 DIAGNOSIS — C786 Secondary malignant neoplasm of retroperitoneum and peritoneum: Secondary | ICD-10-CM | POA: Diagnosis not present

## 2021-04-09 ENCOUNTER — Other Ambulatory Visit: Payer: Self-pay | Admitting: Primary Care

## 2021-04-11 ENCOUNTER — Telehealth: Payer: Self-pay | Admitting: Internal Medicine

## 2021-04-11 MED ORDER — ALBUTEROL SULFATE HFA 108 (90 BASE) MCG/ACT IN AERS: 2.0000 | INHALATION_SPRAY | RESPIRATORY_TRACT | 4 refills | Status: AC | PRN

## 2021-04-11 NOTE — Telephone Encounter (Signed)
Called patient directly. He had sent a Mychart message on 04/08/21 asking for a refill for his albuterol but he incorrectly told that he needed an OV since we hadn't seen him since 2020. Patient had a visit back in May of this year.   He stated that he would like to have the albuterol sent to [facility] on [address]. I advised him that I would go ahead and send this in for him. He verbalized understanding.   Nothing further needed.

## 2021-04-12 ENCOUNTER — Other Ambulatory Visit: Payer: Self-pay | Admitting: Internal Medicine

## 2021-04-13 MED ORDER — FLUTICASONE-SALMETEROL 232-14 MCG/ACT IN AEPB: INHALATION_SPRAY | RESPIRATORY_TRACT | 11 refills | Status: AC

## 2021-04-13 NOTE — Telephone Encounter (Signed)
Called and spoke with pt's wife Patty who stated that pt was needing Rx for fluticasone-salmeterol sent to the pharmacy. Pt did not notice any difference once tried the [place] and wanted to remain on the less expensive inhaler. Rx for fluticasone-salmeterol has been sent to preferred pharmacy for pt. Nothing further needed.

## 2021-04-13 NOTE — Addendum Note (Signed)
Addended by: Lorretta Harp on: 04/13/2021 10:20 AM   Modules accepted: Orders

## 2021-04-14 DIAGNOSIS — Z23 Encounter for immunization: Secondary | ICD-10-CM | POA: Diagnosis not present

## 2021-04-19 DIAGNOSIS — C787 Secondary malignant neoplasm of liver and intrahepatic bile duct: Secondary | ICD-10-CM | POA: Diagnosis not present

## 2021-04-19 DIAGNOSIS — C7951 Secondary malignant neoplasm of bone: Secondary | ICD-10-CM | POA: Diagnosis not present

## 2021-04-19 DIAGNOSIS — C7880 Secondary malignant neoplasm of unspecified digestive organ: Secondary | ICD-10-CM | POA: Diagnosis not present

## 2021-04-19 DIAGNOSIS — C7A8 Other malignant neuroendocrine tumors: Secondary | ICD-10-CM | POA: Diagnosis not present

## 2021-04-19 DIAGNOSIS — C786 Secondary malignant neoplasm of retroperitoneum and peritoneum: Secondary | ICD-10-CM | POA: Diagnosis not present

## 2021-04-19 DIAGNOSIS — R0600 Dyspnea, unspecified: Secondary | ICD-10-CM | POA: Diagnosis not present

## 2021-05-03 DIAGNOSIS — R1084 Generalized abdominal pain: Secondary | ICD-10-CM | POA: Diagnosis not present

## 2021-05-03 DIAGNOSIS — C786 Secondary malignant neoplasm of retroperitoneum and peritoneum: Secondary | ICD-10-CM | POA: Diagnosis not present

## 2021-05-03 DIAGNOSIS — N400 Enlarged prostate without lower urinary tract symptoms: Secondary | ICD-10-CM | POA: Diagnosis not present

## 2021-05-03 DIAGNOSIS — F32A Depression, unspecified: Secondary | ICD-10-CM | POA: Diagnosis not present

## 2021-05-03 DIAGNOSIS — E785 Hyperlipidemia, unspecified: Secondary | ICD-10-CM | POA: Diagnosis not present

## 2021-05-03 DIAGNOSIS — E039 Hypothyroidism, unspecified: Secondary | ICD-10-CM | POA: Diagnosis not present

## 2021-05-03 DIAGNOSIS — J302 Other seasonal allergic rhinitis: Secondary | ICD-10-CM | POA: Diagnosis not present

## 2021-05-03 DIAGNOSIS — Z87891 Personal history of nicotine dependence: Secondary | ICD-10-CM | POA: Diagnosis not present

## 2021-05-03 DIAGNOSIS — C7951 Secondary malignant neoplasm of bone: Secondary | ICD-10-CM | POA: Diagnosis not present

## 2021-05-03 DIAGNOSIS — C7A8 Other malignant neuroendocrine tumors: Secondary | ICD-10-CM | POA: Diagnosis not present

## 2021-05-03 DIAGNOSIS — C787 Secondary malignant neoplasm of liver and intrahepatic bile duct: Secondary | ICD-10-CM | POA: Diagnosis not present

## 2021-05-03 DIAGNOSIS — J449 Chronic obstructive pulmonary disease, unspecified: Secondary | ICD-10-CM | POA: Diagnosis not present

## 2021-05-03 DIAGNOSIS — C7880 Secondary malignant neoplasm of unspecified digestive organ: Secondary | ICD-10-CM | POA: Diagnosis not present

## 2021-05-03 DIAGNOSIS — R0600 Dyspnea, unspecified: Secondary | ICD-10-CM | POA: Diagnosis not present

## 2021-05-04 DIAGNOSIS — C787 Secondary malignant neoplasm of liver and intrahepatic bile duct: Secondary | ICD-10-CM | POA: Diagnosis not present

## 2021-05-04 DIAGNOSIS — C7A8 Other malignant neuroendocrine tumors: Secondary | ICD-10-CM | POA: Diagnosis not present

## 2021-05-04 DIAGNOSIS — R0600 Dyspnea, unspecified: Secondary | ICD-10-CM | POA: Diagnosis not present

## 2021-05-04 DIAGNOSIS — C7880 Secondary malignant neoplasm of unspecified digestive organ: Secondary | ICD-10-CM | POA: Diagnosis not present

## 2021-05-04 DIAGNOSIS — C786 Secondary malignant neoplasm of retroperitoneum and peritoneum: Secondary | ICD-10-CM | POA: Diagnosis not present

## 2021-05-04 DIAGNOSIS — C7951 Secondary malignant neoplasm of bone: Secondary | ICD-10-CM | POA: Diagnosis not present

## 2021-05-20 DIAGNOSIS — C7951 Secondary malignant neoplasm of bone: Secondary | ICD-10-CM | POA: Diagnosis not present

## 2021-05-20 DIAGNOSIS — C7880 Secondary malignant neoplasm of unspecified digestive organ: Secondary | ICD-10-CM | POA: Diagnosis not present

## 2021-05-20 DIAGNOSIS — C786 Secondary malignant neoplasm of retroperitoneum and peritoneum: Secondary | ICD-10-CM | POA: Diagnosis not present

## 2021-05-20 DIAGNOSIS — R0600 Dyspnea, unspecified: Secondary | ICD-10-CM | POA: Diagnosis not present

## 2021-05-20 DIAGNOSIS — C787 Secondary malignant neoplasm of liver and intrahepatic bile duct: Secondary | ICD-10-CM | POA: Diagnosis not present

## 2021-05-20 DIAGNOSIS — C7A8 Other malignant neuroendocrine tumors: Secondary | ICD-10-CM | POA: Diagnosis not present

## 2021-06-01 DIAGNOSIS — C7A8 Other malignant neuroendocrine tumors: Secondary | ICD-10-CM | POA: Diagnosis not present

## 2021-06-01 DIAGNOSIS — R0600 Dyspnea, unspecified: Secondary | ICD-10-CM | POA: Diagnosis not present

## 2021-06-01 DIAGNOSIS — C786 Secondary malignant neoplasm of retroperitoneum and peritoneum: Secondary | ICD-10-CM | POA: Diagnosis not present

## 2021-06-01 DIAGNOSIS — C7951 Secondary malignant neoplasm of bone: Secondary | ICD-10-CM | POA: Diagnosis not present

## 2021-06-01 DIAGNOSIS — C7880 Secondary malignant neoplasm of unspecified digestive organ: Secondary | ICD-10-CM | POA: Diagnosis not present

## 2021-06-01 DIAGNOSIS — C787 Secondary malignant neoplasm of liver and intrahepatic bile duct: Secondary | ICD-10-CM | POA: Diagnosis not present

## 2021-06-02 DIAGNOSIS — N400 Enlarged prostate without lower urinary tract symptoms: Secondary | ICD-10-CM | POA: Diagnosis not present

## 2021-06-02 DIAGNOSIS — R1084 Generalized abdominal pain: Secondary | ICD-10-CM | POA: Diagnosis not present

## 2021-06-02 DIAGNOSIS — Z87891 Personal history of nicotine dependence: Secondary | ICD-10-CM | POA: Diagnosis not present

## 2021-06-02 DIAGNOSIS — J302 Other seasonal allergic rhinitis: Secondary | ICD-10-CM | POA: Diagnosis not present

## 2021-06-02 DIAGNOSIS — C7880 Secondary malignant neoplasm of unspecified digestive organ: Secondary | ICD-10-CM | POA: Diagnosis not present

## 2021-06-02 DIAGNOSIS — E039 Hypothyroidism, unspecified: Secondary | ICD-10-CM | POA: Diagnosis not present

## 2021-06-02 DIAGNOSIS — E785 Hyperlipidemia, unspecified: Secondary | ICD-10-CM | POA: Diagnosis not present

## 2021-06-02 DIAGNOSIS — C7A8 Other malignant neuroendocrine tumors: Secondary | ICD-10-CM | POA: Diagnosis not present

## 2021-06-02 DIAGNOSIS — C786 Secondary malignant neoplasm of retroperitoneum and peritoneum: Secondary | ICD-10-CM | POA: Diagnosis not present

## 2021-06-02 DIAGNOSIS — R0600 Dyspnea, unspecified: Secondary | ICD-10-CM | POA: Diagnosis not present

## 2021-06-02 DIAGNOSIS — C7951 Secondary malignant neoplasm of bone: Secondary | ICD-10-CM | POA: Diagnosis not present

## 2021-06-02 DIAGNOSIS — F32A Depression, unspecified: Secondary | ICD-10-CM | POA: Diagnosis not present

## 2021-06-02 DIAGNOSIS — J449 Chronic obstructive pulmonary disease, unspecified: Secondary | ICD-10-CM | POA: Diagnosis not present

## 2021-06-02 DIAGNOSIS — C787 Secondary malignant neoplasm of liver and intrahepatic bile duct: Secondary | ICD-10-CM | POA: Diagnosis not present

## 2021-06-13 DIAGNOSIS — C7A8 Other malignant neuroendocrine tumors: Secondary | ICD-10-CM | POA: Diagnosis not present

## 2021-06-13 DIAGNOSIS — R0600 Dyspnea, unspecified: Secondary | ICD-10-CM | POA: Diagnosis not present

## 2021-06-13 DIAGNOSIS — C7951 Secondary malignant neoplasm of bone: Secondary | ICD-10-CM | POA: Diagnosis not present

## 2021-06-13 DIAGNOSIS — C786 Secondary malignant neoplasm of retroperitoneum and peritoneum: Secondary | ICD-10-CM | POA: Diagnosis not present

## 2021-06-13 DIAGNOSIS — C7880 Secondary malignant neoplasm of unspecified digestive organ: Secondary | ICD-10-CM | POA: Diagnosis not present

## 2021-06-13 DIAGNOSIS — C787 Secondary malignant neoplasm of liver and intrahepatic bile duct: Secondary | ICD-10-CM | POA: Diagnosis not present

## 2021-06-15 DIAGNOSIS — R0600 Dyspnea, unspecified: Secondary | ICD-10-CM | POA: Diagnosis not present

## 2021-06-15 DIAGNOSIS — C7880 Secondary malignant neoplasm of unspecified digestive organ: Secondary | ICD-10-CM | POA: Diagnosis not present

## 2021-06-15 DIAGNOSIS — C786 Secondary malignant neoplasm of retroperitoneum and peritoneum: Secondary | ICD-10-CM | POA: Diagnosis not present

## 2021-06-15 DIAGNOSIS — C7951 Secondary malignant neoplasm of bone: Secondary | ICD-10-CM | POA: Diagnosis not present

## 2021-06-15 DIAGNOSIS — C787 Secondary malignant neoplasm of liver and intrahepatic bile duct: Secondary | ICD-10-CM | POA: Diagnosis not present

## 2021-06-15 DIAGNOSIS — C7A8 Other malignant neuroendocrine tumors: Secondary | ICD-10-CM | POA: Diagnosis not present

## 2021-06-29 DIAGNOSIS — C7880 Secondary malignant neoplasm of unspecified digestive organ: Secondary | ICD-10-CM | POA: Diagnosis not present

## 2021-06-29 DIAGNOSIS — C7A8 Other malignant neuroendocrine tumors: Secondary | ICD-10-CM | POA: Diagnosis not present

## 2021-06-29 DIAGNOSIS — R0600 Dyspnea, unspecified: Secondary | ICD-10-CM | POA: Diagnosis not present

## 2021-06-29 DIAGNOSIS — C786 Secondary malignant neoplasm of retroperitoneum and peritoneum: Secondary | ICD-10-CM | POA: Diagnosis not present

## 2021-06-29 DIAGNOSIS — C787 Secondary malignant neoplasm of liver and intrahepatic bile duct: Secondary | ICD-10-CM | POA: Diagnosis not present

## 2021-06-29 DIAGNOSIS — C7951 Secondary malignant neoplasm of bone: Secondary | ICD-10-CM | POA: Diagnosis not present

## 2021-07-03 DIAGNOSIS — C7880 Secondary malignant neoplasm of unspecified digestive organ: Secondary | ICD-10-CM | POA: Diagnosis not present

## 2021-07-03 DIAGNOSIS — C787 Secondary malignant neoplasm of liver and intrahepatic bile duct: Secondary | ICD-10-CM | POA: Diagnosis not present

## 2021-07-03 DIAGNOSIS — E039 Hypothyroidism, unspecified: Secondary | ICD-10-CM | POA: Diagnosis not present

## 2021-07-03 DIAGNOSIS — C786 Secondary malignant neoplasm of retroperitoneum and peritoneum: Secondary | ICD-10-CM | POA: Diagnosis not present

## 2021-07-03 DIAGNOSIS — F32A Depression, unspecified: Secondary | ICD-10-CM | POA: Diagnosis not present

## 2021-07-03 DIAGNOSIS — Z87891 Personal history of nicotine dependence: Secondary | ICD-10-CM | POA: Diagnosis not present

## 2021-07-03 DIAGNOSIS — R0600 Dyspnea, unspecified: Secondary | ICD-10-CM | POA: Diagnosis not present

## 2021-07-03 DIAGNOSIS — J302 Other seasonal allergic rhinitis: Secondary | ICD-10-CM | POA: Diagnosis not present

## 2021-07-03 DIAGNOSIS — R1084 Generalized abdominal pain: Secondary | ICD-10-CM | POA: Diagnosis not present

## 2021-07-03 DIAGNOSIS — J449 Chronic obstructive pulmonary disease, unspecified: Secondary | ICD-10-CM | POA: Diagnosis not present

## 2021-07-03 DIAGNOSIS — N400 Enlarged prostate without lower urinary tract symptoms: Secondary | ICD-10-CM | POA: Diagnosis not present

## 2021-07-03 DIAGNOSIS — E785 Hyperlipidemia, unspecified: Secondary | ICD-10-CM | POA: Diagnosis not present

## 2021-07-03 DIAGNOSIS — C7A8 Other malignant neuroendocrine tumors: Secondary | ICD-10-CM | POA: Diagnosis not present

## 2021-07-03 DIAGNOSIS — C7951 Secondary malignant neoplasm of bone: Secondary | ICD-10-CM | POA: Diagnosis not present

## 2021-07-06 DIAGNOSIS — C7A8 Other malignant neuroendocrine tumors: Secondary | ICD-10-CM | POA: Diagnosis not present

## 2021-07-06 DIAGNOSIS — C787 Secondary malignant neoplasm of liver and intrahepatic bile duct: Secondary | ICD-10-CM | POA: Diagnosis not present

## 2021-07-06 DIAGNOSIS — C7880 Secondary malignant neoplasm of unspecified digestive organ: Secondary | ICD-10-CM | POA: Diagnosis not present

## 2021-07-06 DIAGNOSIS — C7951 Secondary malignant neoplasm of bone: Secondary | ICD-10-CM | POA: Diagnosis not present

## 2021-07-06 DIAGNOSIS — C786 Secondary malignant neoplasm of retroperitoneum and peritoneum: Secondary | ICD-10-CM | POA: Diagnosis not present

## 2021-07-06 DIAGNOSIS — R0600 Dyspnea, unspecified: Secondary | ICD-10-CM | POA: Diagnosis not present

## 2021-07-08 DIAGNOSIS — R339 Retention of urine, unspecified: Secondary | ICD-10-CM | POA: Diagnosis not present

## 2021-07-14 DIAGNOSIS — C7951 Secondary malignant neoplasm of bone: Secondary | ICD-10-CM | POA: Diagnosis not present

## 2021-07-14 DIAGNOSIS — C7A8 Other malignant neuroendocrine tumors: Secondary | ICD-10-CM | POA: Diagnosis not present

## 2021-07-14 DIAGNOSIS — C787 Secondary malignant neoplasm of liver and intrahepatic bile duct: Secondary | ICD-10-CM | POA: Diagnosis not present

## 2021-07-14 DIAGNOSIS — C7880 Secondary malignant neoplasm of unspecified digestive organ: Secondary | ICD-10-CM | POA: Diagnosis not present

## 2021-07-14 DIAGNOSIS — R0600 Dyspnea, unspecified: Secondary | ICD-10-CM | POA: Diagnosis not present

## 2021-07-14 DIAGNOSIS — C786 Secondary malignant neoplasm of retroperitoneum and peritoneum: Secondary | ICD-10-CM | POA: Diagnosis not present

## 2021-07-27 DIAGNOSIS — C786 Secondary malignant neoplasm of retroperitoneum and peritoneum: Secondary | ICD-10-CM | POA: Diagnosis not present

## 2021-07-27 DIAGNOSIS — R0600 Dyspnea, unspecified: Secondary | ICD-10-CM | POA: Diagnosis not present

## 2021-07-27 DIAGNOSIS — C7880 Secondary malignant neoplasm of unspecified digestive organ: Secondary | ICD-10-CM | POA: Diagnosis not present

## 2021-07-27 DIAGNOSIS — C7951 Secondary malignant neoplasm of bone: Secondary | ICD-10-CM | POA: Diagnosis not present

## 2021-07-27 DIAGNOSIS — C787 Secondary malignant neoplasm of liver and intrahepatic bile duct: Secondary | ICD-10-CM | POA: Diagnosis not present

## 2021-07-27 DIAGNOSIS — C7A8 Other malignant neuroendocrine tumors: Secondary | ICD-10-CM | POA: Diagnosis not present

## 2021-08-03 DIAGNOSIS — J449 Chronic obstructive pulmonary disease, unspecified: Secondary | ICD-10-CM | POA: Diagnosis not present

## 2021-08-03 DIAGNOSIS — J302 Other seasonal allergic rhinitis: Secondary | ICD-10-CM | POA: Diagnosis not present

## 2021-08-03 DIAGNOSIS — R0600 Dyspnea, unspecified: Secondary | ICD-10-CM | POA: Diagnosis not present

## 2021-08-03 DIAGNOSIS — C7A8 Other malignant neuroendocrine tumors: Secondary | ICD-10-CM | POA: Diagnosis not present

## 2021-08-03 DIAGNOSIS — Z87891 Personal history of nicotine dependence: Secondary | ICD-10-CM | POA: Diagnosis not present

## 2021-08-03 DIAGNOSIS — C787 Secondary malignant neoplasm of liver and intrahepatic bile duct: Secondary | ICD-10-CM | POA: Diagnosis not present

## 2021-08-03 DIAGNOSIS — R1084 Generalized abdominal pain: Secondary | ICD-10-CM | POA: Diagnosis not present

## 2021-08-03 DIAGNOSIS — E039 Hypothyroidism, unspecified: Secondary | ICD-10-CM | POA: Diagnosis not present

## 2021-08-03 DIAGNOSIS — C7951 Secondary malignant neoplasm of bone: Secondary | ICD-10-CM | POA: Diagnosis not present

## 2021-08-03 DIAGNOSIS — E785 Hyperlipidemia, unspecified: Secondary | ICD-10-CM | POA: Diagnosis not present

## 2021-08-03 DIAGNOSIS — N400 Enlarged prostate without lower urinary tract symptoms: Secondary | ICD-10-CM | POA: Diagnosis not present

## 2021-08-03 DIAGNOSIS — C7880 Secondary malignant neoplasm of unspecified digestive organ: Secondary | ICD-10-CM | POA: Diagnosis not present

## 2021-08-03 DIAGNOSIS — C786 Secondary malignant neoplasm of retroperitoneum and peritoneum: Secondary | ICD-10-CM | POA: Diagnosis not present

## 2021-08-03 DIAGNOSIS — F32A Depression, unspecified: Secondary | ICD-10-CM | POA: Diagnosis not present

## 2021-08-10 DIAGNOSIS — C787 Secondary malignant neoplasm of liver and intrahepatic bile duct: Secondary | ICD-10-CM | POA: Diagnosis not present

## 2021-08-10 DIAGNOSIS — C7A8 Other malignant neuroendocrine tumors: Secondary | ICD-10-CM | POA: Diagnosis not present

## 2021-08-10 DIAGNOSIS — C7880 Secondary malignant neoplasm of unspecified digestive organ: Secondary | ICD-10-CM | POA: Diagnosis not present

## 2021-08-10 DIAGNOSIS — C786 Secondary malignant neoplasm of retroperitoneum and peritoneum: Secondary | ICD-10-CM | POA: Diagnosis not present

## 2021-08-10 DIAGNOSIS — C7951 Secondary malignant neoplasm of bone: Secondary | ICD-10-CM | POA: Diagnosis not present

## 2021-08-10 DIAGNOSIS — R0600 Dyspnea, unspecified: Secondary | ICD-10-CM | POA: Diagnosis not present

## 2021-08-12 DIAGNOSIS — C7880 Secondary malignant neoplasm of unspecified digestive organ: Secondary | ICD-10-CM | POA: Diagnosis not present

## 2021-08-12 DIAGNOSIS — C7951 Secondary malignant neoplasm of bone: Secondary | ICD-10-CM | POA: Diagnosis not present

## 2021-08-12 DIAGNOSIS — C786 Secondary malignant neoplasm of retroperitoneum and peritoneum: Secondary | ICD-10-CM | POA: Diagnosis not present

## 2021-08-12 DIAGNOSIS — C7A8 Other malignant neuroendocrine tumors: Secondary | ICD-10-CM | POA: Diagnosis not present

## 2021-08-12 DIAGNOSIS — R0600 Dyspnea, unspecified: Secondary | ICD-10-CM | POA: Diagnosis not present

## 2021-08-12 DIAGNOSIS — C787 Secondary malignant neoplasm of liver and intrahepatic bile duct: Secondary | ICD-10-CM | POA: Diagnosis not present

## 2021-08-21 IMAGING — DX DG CHEST 2V
2 series · 2 of 2 positions shown · non-contrast
Comparison: 04/28/2020

CLINICAL DATA: COPD

EXAM:
CHEST - 2 VIEW

[chest pa]
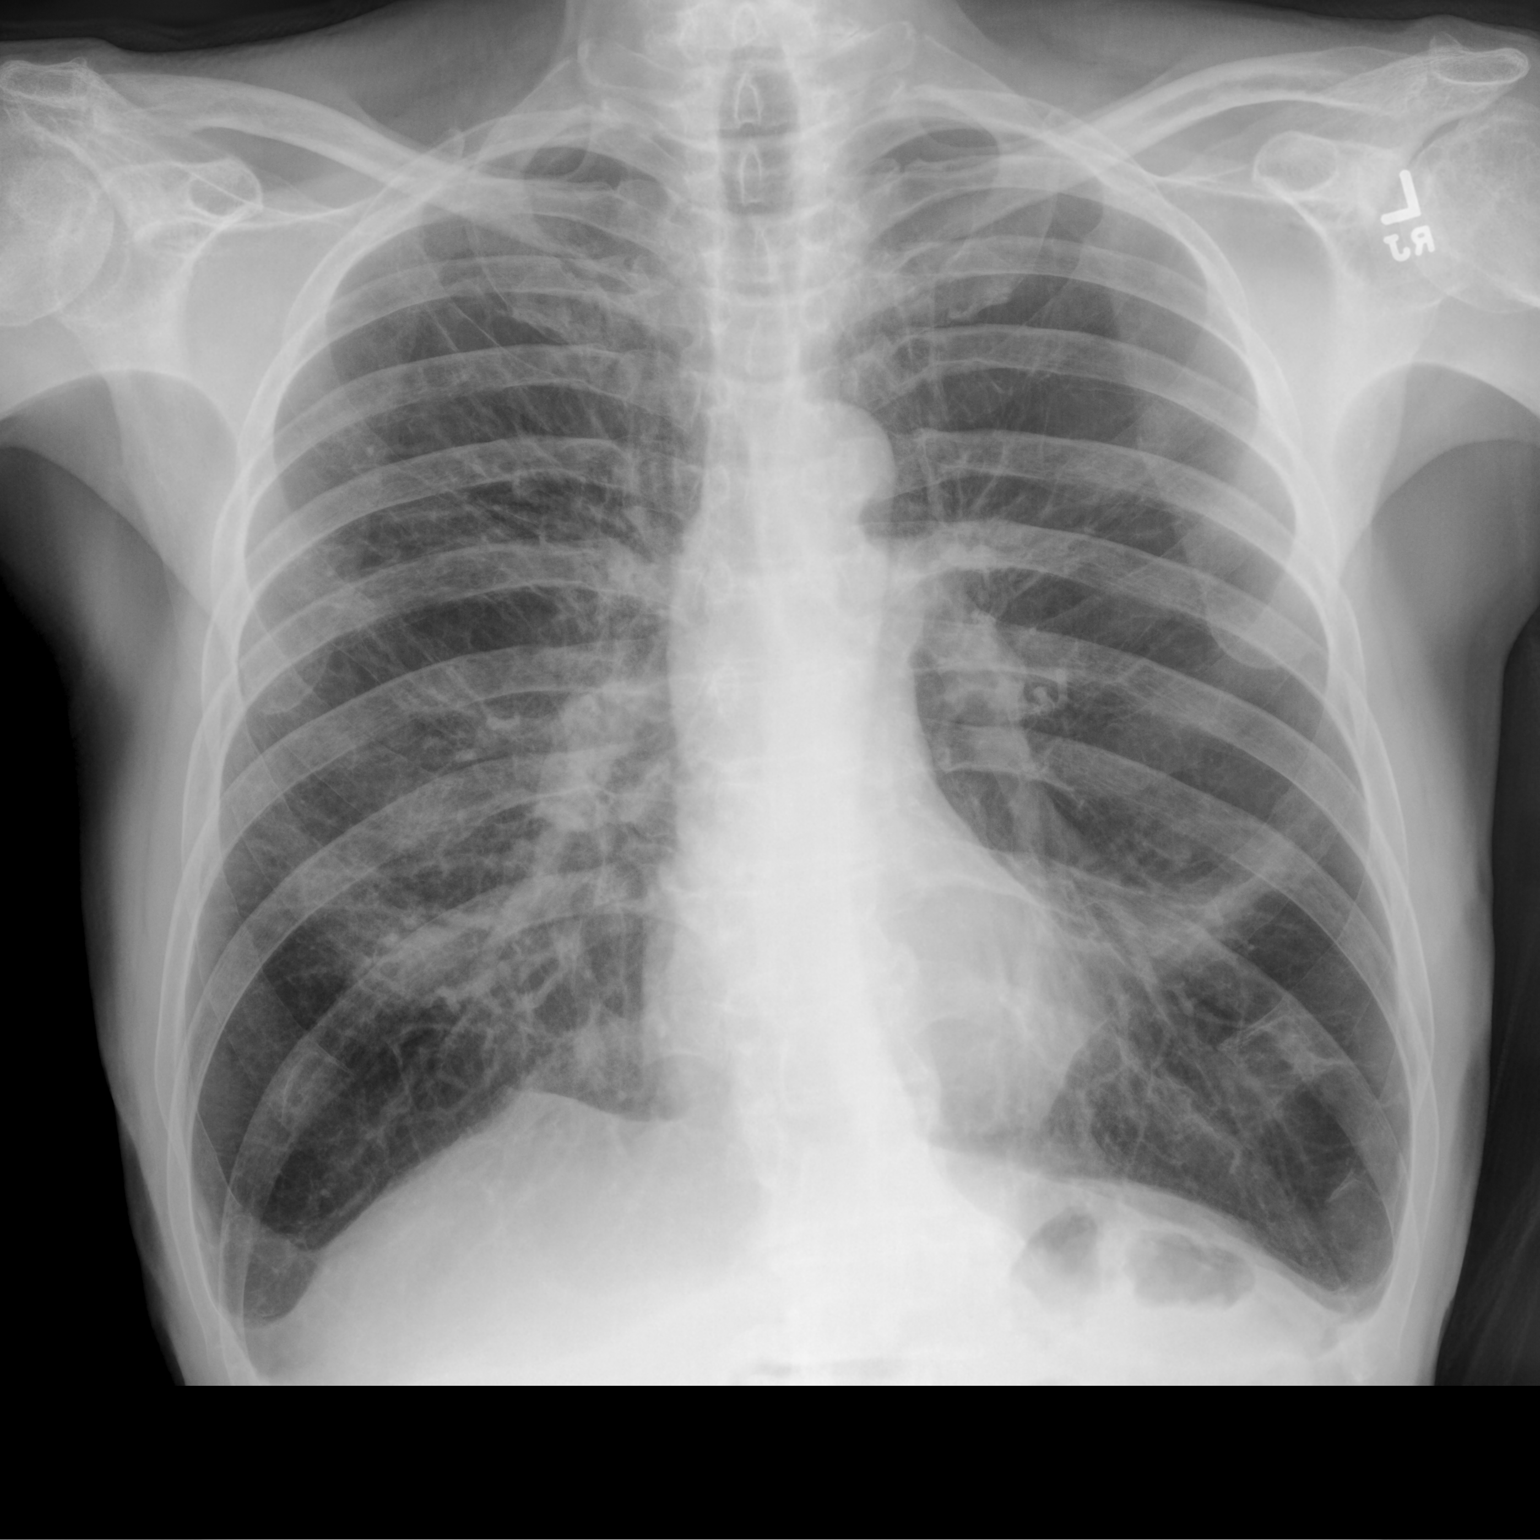

[chest lat]
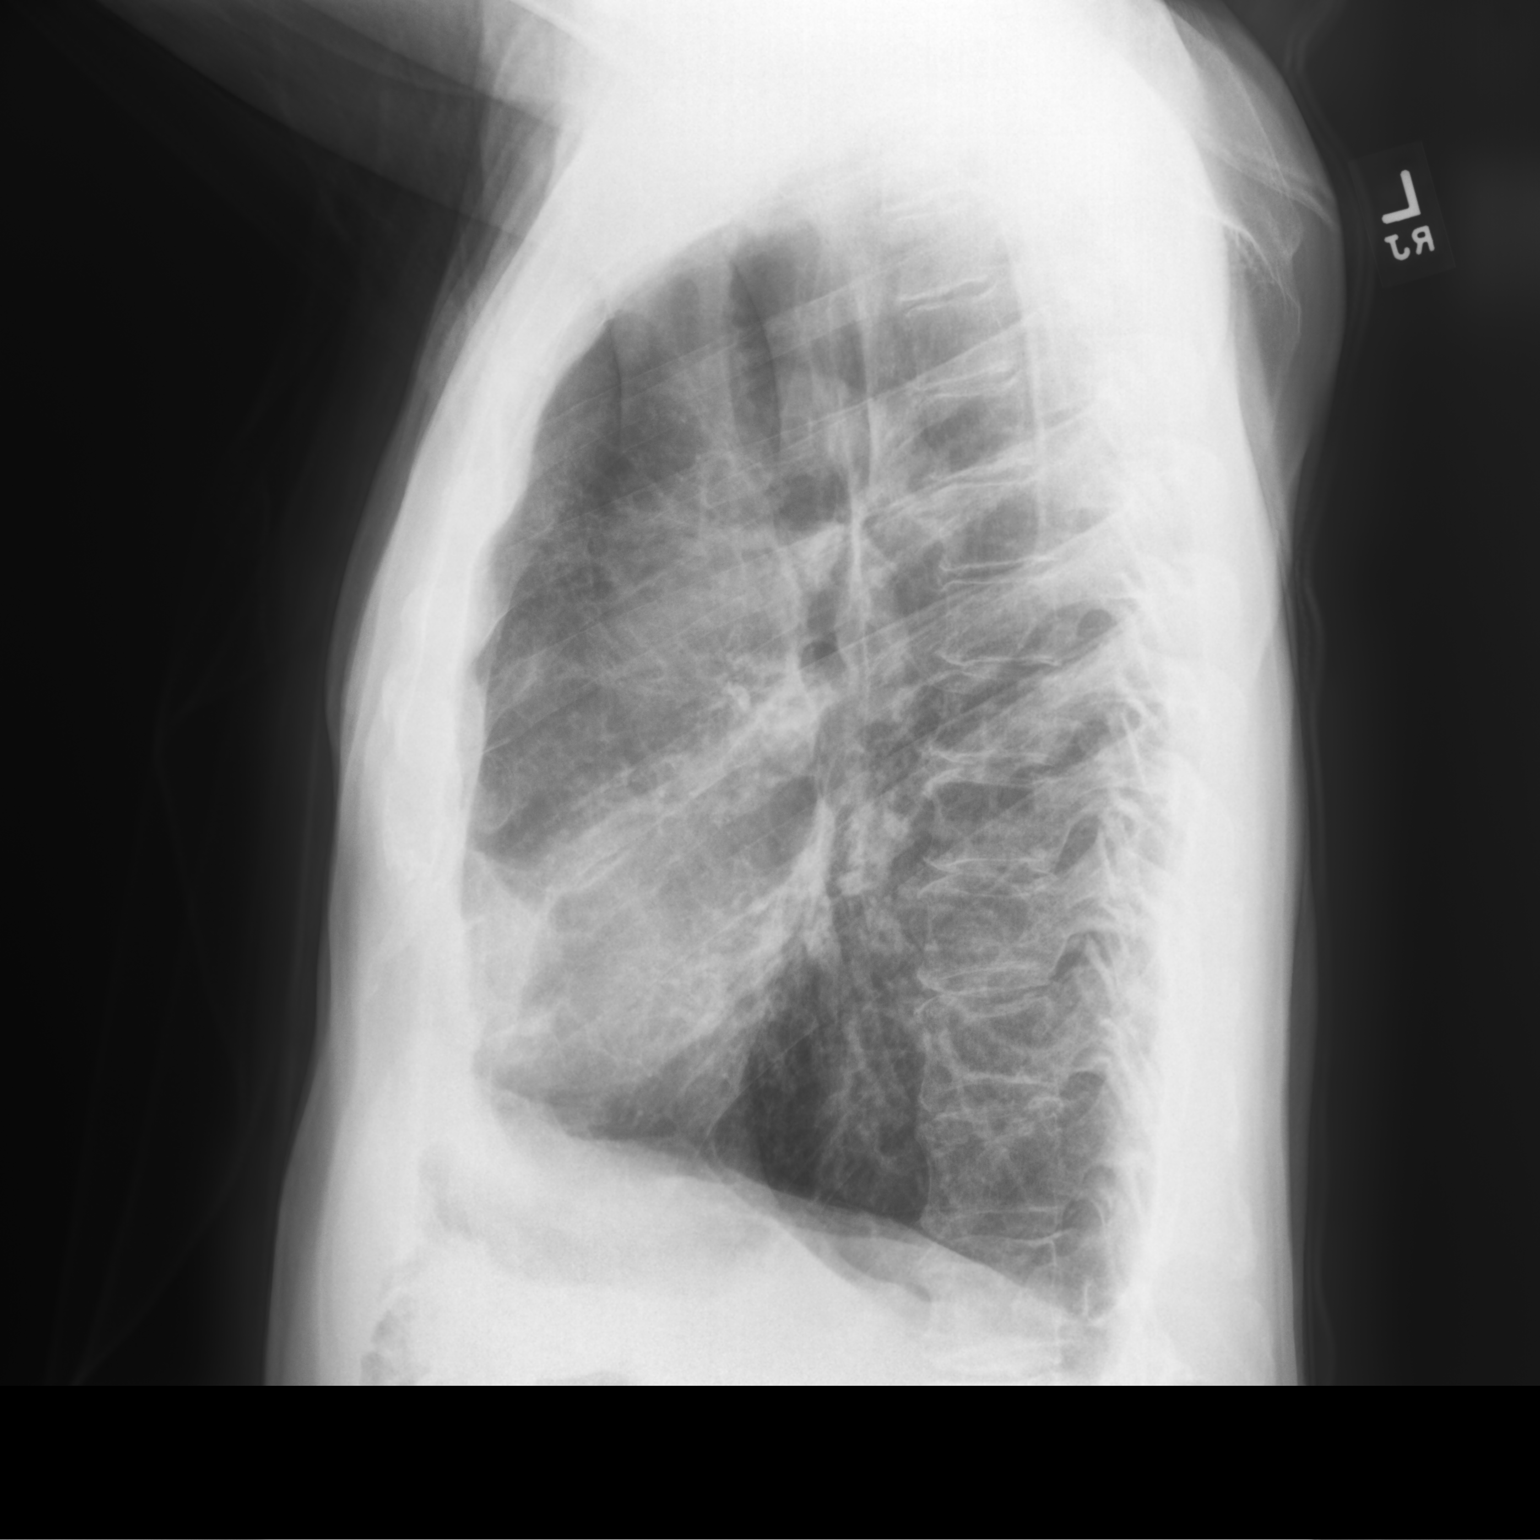

[2 of 2 positions shown; findings below may reference images not displayed]

FINDINGS: The heart size and mediastinal contours are within normal limits.
Emphysema. Unchanged small, chronic bilateral pleural effusions and
or pleural thickening. The visualized skeletal structures are
unremarkable.
IMPRESSION: 1. Emphysema. No acute abnormality of the lungs.
2. Unchanged small, chronic bilateral pleural effusions and/or
pleural thickening.

## 2021-08-24 DIAGNOSIS — C787 Secondary malignant neoplasm of liver and intrahepatic bile duct: Secondary | ICD-10-CM | POA: Diagnosis not present

## 2021-08-24 DIAGNOSIS — C7951 Secondary malignant neoplasm of bone: Secondary | ICD-10-CM | POA: Diagnosis not present

## 2021-08-24 DIAGNOSIS — C7A8 Other malignant neuroendocrine tumors: Secondary | ICD-10-CM | POA: Diagnosis not present

## 2021-08-24 DIAGNOSIS — C7880 Secondary malignant neoplasm of unspecified digestive organ: Secondary | ICD-10-CM | POA: Diagnosis not present

## 2021-08-24 DIAGNOSIS — R0600 Dyspnea, unspecified: Secondary | ICD-10-CM | POA: Diagnosis not present

## 2021-08-24 DIAGNOSIS — C786 Secondary malignant neoplasm of retroperitoneum and peritoneum: Secondary | ICD-10-CM | POA: Diagnosis not present

## 2021-08-31 DIAGNOSIS — C7A8 Other malignant neuroendocrine tumors: Secondary | ICD-10-CM | POA: Diagnosis not present

## 2021-08-31 DIAGNOSIS — C787 Secondary malignant neoplasm of liver and intrahepatic bile duct: Secondary | ICD-10-CM | POA: Diagnosis not present

## 2021-08-31 DIAGNOSIS — Z87891 Personal history of nicotine dependence: Secondary | ICD-10-CM | POA: Diagnosis not present

## 2021-08-31 DIAGNOSIS — E039 Hypothyroidism, unspecified: Secondary | ICD-10-CM | POA: Diagnosis not present

## 2021-08-31 DIAGNOSIS — N400 Enlarged prostate without lower urinary tract symptoms: Secondary | ICD-10-CM | POA: Diagnosis not present

## 2021-08-31 DIAGNOSIS — E785 Hyperlipidemia, unspecified: Secondary | ICD-10-CM | POA: Diagnosis not present

## 2021-08-31 DIAGNOSIS — C7951 Secondary malignant neoplasm of bone: Secondary | ICD-10-CM | POA: Diagnosis not present

## 2021-08-31 DIAGNOSIS — F32A Depression, unspecified: Secondary | ICD-10-CM | POA: Diagnosis not present

## 2021-08-31 DIAGNOSIS — J449 Chronic obstructive pulmonary disease, unspecified: Secondary | ICD-10-CM | POA: Diagnosis not present

## 2021-08-31 DIAGNOSIS — R0600 Dyspnea, unspecified: Secondary | ICD-10-CM | POA: Diagnosis not present

## 2021-08-31 DIAGNOSIS — R1084 Generalized abdominal pain: Secondary | ICD-10-CM | POA: Diagnosis not present

## 2021-08-31 DIAGNOSIS — J302 Other seasonal allergic rhinitis: Secondary | ICD-10-CM | POA: Diagnosis not present

## 2021-08-31 DIAGNOSIS — C786 Secondary malignant neoplasm of retroperitoneum and peritoneum: Secondary | ICD-10-CM | POA: Diagnosis not present

## 2021-08-31 DIAGNOSIS — C7880 Secondary malignant neoplasm of unspecified digestive organ: Secondary | ICD-10-CM | POA: Diagnosis not present

## 2021-09-02 DIAGNOSIS — Z20822 Contact with and (suspected) exposure to covid-19: Secondary | ICD-10-CM | POA: Diagnosis not present

## 2021-09-06 DIAGNOSIS — R0600 Dyspnea, unspecified: Secondary | ICD-10-CM | POA: Diagnosis not present

## 2021-09-06 DIAGNOSIS — C7880 Secondary malignant neoplasm of unspecified digestive organ: Secondary | ICD-10-CM | POA: Diagnosis not present

## 2021-09-06 DIAGNOSIS — C787 Secondary malignant neoplasm of liver and intrahepatic bile duct: Secondary | ICD-10-CM | POA: Diagnosis not present

## 2021-09-06 DIAGNOSIS — C786 Secondary malignant neoplasm of retroperitoneum and peritoneum: Secondary | ICD-10-CM | POA: Diagnosis not present

## 2021-09-06 DIAGNOSIS — C7951 Secondary malignant neoplasm of bone: Secondary | ICD-10-CM | POA: Diagnosis not present

## 2021-09-06 DIAGNOSIS — C7A8 Other malignant neuroendocrine tumors: Secondary | ICD-10-CM | POA: Diagnosis not present

## 2021-09-09 DIAGNOSIS — R0600 Dyspnea, unspecified: Secondary | ICD-10-CM | POA: Diagnosis not present

## 2021-09-09 DIAGNOSIS — C7880 Secondary malignant neoplasm of unspecified digestive organ: Secondary | ICD-10-CM | POA: Diagnosis not present

## 2021-09-09 DIAGNOSIS — C787 Secondary malignant neoplasm of liver and intrahepatic bile duct: Secondary | ICD-10-CM | POA: Diagnosis not present

## 2021-09-09 DIAGNOSIS — C7951 Secondary malignant neoplasm of bone: Secondary | ICD-10-CM | POA: Diagnosis not present

## 2021-09-09 DIAGNOSIS — C786 Secondary malignant neoplasm of retroperitoneum and peritoneum: Secondary | ICD-10-CM | POA: Diagnosis not present

## 2021-09-09 DIAGNOSIS — C7A8 Other malignant neuroendocrine tumors: Secondary | ICD-10-CM | POA: Diagnosis not present

## 2021-09-12 DIAGNOSIS — C787 Secondary malignant neoplasm of liver and intrahepatic bile duct: Secondary | ICD-10-CM | POA: Diagnosis not present

## 2021-09-12 DIAGNOSIS — C786 Secondary malignant neoplasm of retroperitoneum and peritoneum: Secondary | ICD-10-CM | POA: Diagnosis not present

## 2021-09-12 DIAGNOSIS — C7951 Secondary malignant neoplasm of bone: Secondary | ICD-10-CM | POA: Diagnosis not present

## 2021-09-12 DIAGNOSIS — R0600 Dyspnea, unspecified: Secondary | ICD-10-CM | POA: Diagnosis not present

## 2021-09-12 DIAGNOSIS — C7880 Secondary malignant neoplasm of unspecified digestive organ: Secondary | ICD-10-CM | POA: Diagnosis not present

## 2021-09-12 DIAGNOSIS — C7A8 Other malignant neuroendocrine tumors: Secondary | ICD-10-CM | POA: Diagnosis not present

## 2021-09-21 DIAGNOSIS — R0600 Dyspnea, unspecified: Secondary | ICD-10-CM | POA: Diagnosis not present

## 2021-09-21 DIAGNOSIS — C787 Secondary malignant neoplasm of liver and intrahepatic bile duct: Secondary | ICD-10-CM | POA: Diagnosis not present

## 2021-09-21 DIAGNOSIS — C7951 Secondary malignant neoplasm of bone: Secondary | ICD-10-CM | POA: Diagnosis not present

## 2021-09-21 DIAGNOSIS — C7880 Secondary malignant neoplasm of unspecified digestive organ: Secondary | ICD-10-CM | POA: Diagnosis not present

## 2021-09-21 DIAGNOSIS — C786 Secondary malignant neoplasm of retroperitoneum and peritoneum: Secondary | ICD-10-CM | POA: Diagnosis not present

## 2021-09-21 DIAGNOSIS — C7A8 Other malignant neuroendocrine tumors: Secondary | ICD-10-CM | POA: Diagnosis not present

## 2021-10-01 DIAGNOSIS — F32A Depression, unspecified: Secondary | ICD-10-CM | POA: Diagnosis not present

## 2021-10-01 DIAGNOSIS — C7880 Secondary malignant neoplasm of unspecified digestive organ: Secondary | ICD-10-CM | POA: Diagnosis not present

## 2021-10-01 DIAGNOSIS — C786 Secondary malignant neoplasm of retroperitoneum and peritoneum: Secondary | ICD-10-CM | POA: Diagnosis not present

## 2021-10-01 DIAGNOSIS — R0600 Dyspnea, unspecified: Secondary | ICD-10-CM | POA: Diagnosis not present

## 2021-10-01 DIAGNOSIS — R1084 Generalized abdominal pain: Secondary | ICD-10-CM | POA: Diagnosis not present

## 2021-10-01 DIAGNOSIS — E785 Hyperlipidemia, unspecified: Secondary | ICD-10-CM | POA: Diagnosis not present

## 2021-10-01 DIAGNOSIS — J302 Other seasonal allergic rhinitis: Secondary | ICD-10-CM | POA: Diagnosis not present

## 2021-10-01 DIAGNOSIS — J449 Chronic obstructive pulmonary disease, unspecified: Secondary | ICD-10-CM | POA: Diagnosis not present

## 2021-10-01 DIAGNOSIS — C787 Secondary malignant neoplasm of liver and intrahepatic bile duct: Secondary | ICD-10-CM | POA: Diagnosis not present

## 2021-10-01 DIAGNOSIS — N400 Enlarged prostate without lower urinary tract symptoms: Secondary | ICD-10-CM | POA: Diagnosis not present

## 2021-10-01 DIAGNOSIS — C7951 Secondary malignant neoplasm of bone: Secondary | ICD-10-CM | POA: Diagnosis not present

## 2021-10-01 DIAGNOSIS — E039 Hypothyroidism, unspecified: Secondary | ICD-10-CM | POA: Diagnosis not present

## 2021-10-01 DIAGNOSIS — Z87891 Personal history of nicotine dependence: Secondary | ICD-10-CM | POA: Diagnosis not present

## 2021-10-01 DIAGNOSIS — C7A8 Other malignant neuroendocrine tumors: Secondary | ICD-10-CM | POA: Diagnosis not present

## 2021-10-04 DIAGNOSIS — C7951 Secondary malignant neoplasm of bone: Secondary | ICD-10-CM | POA: Diagnosis not present

## 2021-10-04 DIAGNOSIS — C787 Secondary malignant neoplasm of liver and intrahepatic bile duct: Secondary | ICD-10-CM | POA: Diagnosis not present

## 2021-10-04 DIAGNOSIS — R0600 Dyspnea, unspecified: Secondary | ICD-10-CM | POA: Diagnosis not present

## 2021-10-04 DIAGNOSIS — C786 Secondary malignant neoplasm of retroperitoneum and peritoneum: Secondary | ICD-10-CM | POA: Diagnosis not present

## 2021-10-04 DIAGNOSIS — C7880 Secondary malignant neoplasm of unspecified digestive organ: Secondary | ICD-10-CM | POA: Diagnosis not present

## 2021-10-04 DIAGNOSIS — C7A8 Other malignant neuroendocrine tumors: Secondary | ICD-10-CM | POA: Diagnosis not present

## 2021-10-05 DIAGNOSIS — C7A8 Other malignant neuroendocrine tumors: Secondary | ICD-10-CM | POA: Diagnosis not present

## 2021-10-05 DIAGNOSIS — C786 Secondary malignant neoplasm of retroperitoneum and peritoneum: Secondary | ICD-10-CM | POA: Diagnosis not present

## 2021-10-05 DIAGNOSIS — C7951 Secondary malignant neoplasm of bone: Secondary | ICD-10-CM | POA: Diagnosis not present

## 2021-10-05 DIAGNOSIS — C787 Secondary malignant neoplasm of liver and intrahepatic bile duct: Secondary | ICD-10-CM | POA: Diagnosis not present

## 2021-10-05 DIAGNOSIS — C7880 Secondary malignant neoplasm of unspecified digestive organ: Secondary | ICD-10-CM | POA: Diagnosis not present

## 2021-10-05 DIAGNOSIS — R0600 Dyspnea, unspecified: Secondary | ICD-10-CM | POA: Diagnosis not present

## 2021-10-06 DIAGNOSIS — C786 Secondary malignant neoplasm of retroperitoneum and peritoneum: Secondary | ICD-10-CM | POA: Diagnosis not present

## 2021-10-06 DIAGNOSIS — C7951 Secondary malignant neoplasm of bone: Secondary | ICD-10-CM | POA: Diagnosis not present

## 2021-10-06 DIAGNOSIS — C787 Secondary malignant neoplasm of liver and intrahepatic bile duct: Secondary | ICD-10-CM | POA: Diagnosis not present

## 2021-10-06 DIAGNOSIS — R0600 Dyspnea, unspecified: Secondary | ICD-10-CM | POA: Diagnosis not present

## 2021-10-06 DIAGNOSIS — C7A8 Other malignant neuroendocrine tumors: Secondary | ICD-10-CM | POA: Diagnosis not present

## 2021-10-06 DIAGNOSIS — C7880 Secondary malignant neoplasm of unspecified digestive organ: Secondary | ICD-10-CM | POA: Diagnosis not present

## 2021-10-17 DIAGNOSIS — Z20828 Contact with and (suspected) exposure to other viral communicable diseases: Secondary | ICD-10-CM | POA: Diagnosis not present

## 2021-10-17 DIAGNOSIS — Z1152 Encounter for screening for COVID-19: Secondary | ICD-10-CM | POA: Diagnosis not present

## 2021-10-20 DIAGNOSIS — Z20822 Contact with and (suspected) exposure to covid-19: Secondary | ICD-10-CM | POA: Diagnosis not present

## 2021-10-25 DIAGNOSIS — E782 Mixed hyperlipidemia: Secondary | ICD-10-CM | POA: Diagnosis not present

## 2021-10-25 DIAGNOSIS — E890 Postprocedural hypothyroidism: Secondary | ICD-10-CM | POA: Diagnosis not present

## 2021-10-25 DIAGNOSIS — D3A8 Other benign neuroendocrine tumors: Secondary | ICD-10-CM | POA: Diagnosis not present

## 2021-10-25 DIAGNOSIS — D696 Thrombocytopenia, unspecified: Secondary | ICD-10-CM | POA: Diagnosis not present

## 2021-10-25 DIAGNOSIS — F330 Major depressive disorder, recurrent, mild: Secondary | ICD-10-CM | POA: Diagnosis not present

## 2021-10-25 DIAGNOSIS — J449 Chronic obstructive pulmonary disease, unspecified: Secondary | ICD-10-CM | POA: Diagnosis not present

## 2021-10-25 DIAGNOSIS — G894 Chronic pain syndrome: Secondary | ICD-10-CM | POA: Diagnosis not present

## 2021-10-25 DIAGNOSIS — F419 Anxiety disorder, unspecified: Secondary | ICD-10-CM | POA: Diagnosis not present

## 2021-10-26 DIAGNOSIS — C7951 Secondary malignant neoplasm of bone: Secondary | ICD-10-CM | POA: Diagnosis not present

## 2021-10-26 DIAGNOSIS — C7A8 Other malignant neuroendocrine tumors: Secondary | ICD-10-CM | POA: Diagnosis not present

## 2021-10-26 DIAGNOSIS — C7880 Secondary malignant neoplasm of unspecified digestive organ: Secondary | ICD-10-CM | POA: Diagnosis not present

## 2021-10-26 DIAGNOSIS — C787 Secondary malignant neoplasm of liver and intrahepatic bile duct: Secondary | ICD-10-CM | POA: Diagnosis not present

## 2021-10-26 DIAGNOSIS — R0600 Dyspnea, unspecified: Secondary | ICD-10-CM | POA: Diagnosis not present

## 2021-10-26 DIAGNOSIS — C786 Secondary malignant neoplasm of retroperitoneum and peritoneum: Secondary | ICD-10-CM | POA: Diagnosis not present

## 2021-10-28 DIAGNOSIS — Z20822 Contact with and (suspected) exposure to covid-19: Secondary | ICD-10-CM | POA: Diagnosis not present

## 2021-10-31 DIAGNOSIS — C787 Secondary malignant neoplasm of liver and intrahepatic bile duct: Secondary | ICD-10-CM | POA: Diagnosis not present

## 2021-10-31 DIAGNOSIS — N400 Enlarged prostate without lower urinary tract symptoms: Secondary | ICD-10-CM | POA: Diagnosis not present

## 2021-10-31 DIAGNOSIS — C7951 Secondary malignant neoplasm of bone: Secondary | ICD-10-CM | POA: Diagnosis not present

## 2021-10-31 DIAGNOSIS — F32A Depression, unspecified: Secondary | ICD-10-CM | POA: Diagnosis not present

## 2021-10-31 DIAGNOSIS — C7880 Secondary malignant neoplasm of unspecified digestive organ: Secondary | ICD-10-CM | POA: Diagnosis not present

## 2021-10-31 DIAGNOSIS — E785 Hyperlipidemia, unspecified: Secondary | ICD-10-CM | POA: Diagnosis not present

## 2021-10-31 DIAGNOSIS — J302 Other seasonal allergic rhinitis: Secondary | ICD-10-CM | POA: Diagnosis not present

## 2021-10-31 DIAGNOSIS — Z9981 Dependence on supplemental oxygen: Secondary | ICD-10-CM | POA: Diagnosis not present

## 2021-10-31 DIAGNOSIS — E039 Hypothyroidism, unspecified: Secondary | ICD-10-CM | POA: Diagnosis not present

## 2021-10-31 DIAGNOSIS — R0600 Dyspnea, unspecified: Secondary | ICD-10-CM | POA: Diagnosis not present

## 2021-10-31 DIAGNOSIS — C7A8 Other malignant neuroendocrine tumors: Secondary | ICD-10-CM | POA: Diagnosis not present

## 2021-10-31 DIAGNOSIS — J449 Chronic obstructive pulmonary disease, unspecified: Secondary | ICD-10-CM | POA: Diagnosis not present

## 2021-10-31 DIAGNOSIS — C786 Secondary malignant neoplasm of retroperitoneum and peritoneum: Secondary | ICD-10-CM | POA: Diagnosis not present

## 2021-10-31 DIAGNOSIS — R1084 Generalized abdominal pain: Secondary | ICD-10-CM | POA: Diagnosis not present

## 2021-10-31 DIAGNOSIS — Z87891 Personal history of nicotine dependence: Secondary | ICD-10-CM | POA: Diagnosis not present

## 2021-11-03 DIAGNOSIS — Z20822 Contact with and (suspected) exposure to covid-19: Secondary | ICD-10-CM | POA: Diagnosis not present

## 2021-11-05 DIAGNOSIS — Z20822 Contact with and (suspected) exposure to covid-19: Secondary | ICD-10-CM | POA: Diagnosis not present

## 2021-11-08 DIAGNOSIS — Z20828 Contact with and (suspected) exposure to other viral communicable diseases: Secondary | ICD-10-CM | POA: Diagnosis not present

## 2021-11-09 DIAGNOSIS — C787 Secondary malignant neoplasm of liver and intrahepatic bile duct: Secondary | ICD-10-CM | POA: Diagnosis not present

## 2021-11-09 DIAGNOSIS — C7A8 Other malignant neuroendocrine tumors: Secondary | ICD-10-CM | POA: Diagnosis not present

## 2021-11-09 DIAGNOSIS — C7880 Secondary malignant neoplasm of unspecified digestive organ: Secondary | ICD-10-CM | POA: Diagnosis not present

## 2021-11-09 DIAGNOSIS — Z20822 Contact with and (suspected) exposure to covid-19: Secondary | ICD-10-CM | POA: Diagnosis not present

## 2021-11-09 DIAGNOSIS — C7951 Secondary malignant neoplasm of bone: Secondary | ICD-10-CM | POA: Diagnosis not present

## 2021-11-09 DIAGNOSIS — R0600 Dyspnea, unspecified: Secondary | ICD-10-CM | POA: Diagnosis not present

## 2021-11-09 DIAGNOSIS — C786 Secondary malignant neoplasm of retroperitoneum and peritoneum: Secondary | ICD-10-CM | POA: Diagnosis not present

## 2021-11-16 DIAGNOSIS — R0600 Dyspnea, unspecified: Secondary | ICD-10-CM | POA: Diagnosis not present

## 2021-11-16 DIAGNOSIS — C7880 Secondary malignant neoplasm of unspecified digestive organ: Secondary | ICD-10-CM | POA: Diagnosis not present

## 2021-11-16 DIAGNOSIS — C7951 Secondary malignant neoplasm of bone: Secondary | ICD-10-CM | POA: Diagnosis not present

## 2021-11-16 DIAGNOSIS — C786 Secondary malignant neoplasm of retroperitoneum and peritoneum: Secondary | ICD-10-CM | POA: Diagnosis not present

## 2021-11-16 DIAGNOSIS — C787 Secondary malignant neoplasm of liver and intrahepatic bile duct: Secondary | ICD-10-CM | POA: Diagnosis not present

## 2021-11-16 DIAGNOSIS — C7A8 Other malignant neuroendocrine tumors: Secondary | ICD-10-CM | POA: Diagnosis not present

## 2021-11-23 DIAGNOSIS — C7880 Secondary malignant neoplasm of unspecified digestive organ: Secondary | ICD-10-CM | POA: Diagnosis not present

## 2021-11-23 DIAGNOSIS — C7A8 Other malignant neuroendocrine tumors: Secondary | ICD-10-CM | POA: Diagnosis not present

## 2021-11-23 DIAGNOSIS — C786 Secondary malignant neoplasm of retroperitoneum and peritoneum: Secondary | ICD-10-CM | POA: Diagnosis not present

## 2021-11-23 DIAGNOSIS — R0600 Dyspnea, unspecified: Secondary | ICD-10-CM | POA: Diagnosis not present

## 2021-11-23 DIAGNOSIS — C787 Secondary malignant neoplasm of liver and intrahepatic bile duct: Secondary | ICD-10-CM | POA: Diagnosis not present

## 2021-11-23 DIAGNOSIS — C7951 Secondary malignant neoplasm of bone: Secondary | ICD-10-CM | POA: Diagnosis not present

## 2021-11-30 DIAGNOSIS — C7880 Secondary malignant neoplasm of unspecified digestive organ: Secondary | ICD-10-CM | POA: Diagnosis not present

## 2021-11-30 DIAGNOSIS — C786 Secondary malignant neoplasm of retroperitoneum and peritoneum: Secondary | ICD-10-CM | POA: Diagnosis not present

## 2021-11-30 DIAGNOSIS — C787 Secondary malignant neoplasm of liver and intrahepatic bile duct: Secondary | ICD-10-CM | POA: Diagnosis not present

## 2021-11-30 DIAGNOSIS — R0600 Dyspnea, unspecified: Secondary | ICD-10-CM | POA: Diagnosis not present

## 2021-11-30 DIAGNOSIS — C7951 Secondary malignant neoplasm of bone: Secondary | ICD-10-CM | POA: Diagnosis not present

## 2021-11-30 DIAGNOSIS — C7A8 Other malignant neuroendocrine tumors: Secondary | ICD-10-CM | POA: Diagnosis not present

## 2021-12-01 DIAGNOSIS — C7951 Secondary malignant neoplasm of bone: Secondary | ICD-10-CM | POA: Diagnosis not present

## 2021-12-01 DIAGNOSIS — C7880 Secondary malignant neoplasm of unspecified digestive organ: Secondary | ICD-10-CM | POA: Diagnosis not present

## 2021-12-01 DIAGNOSIS — R1084 Generalized abdominal pain: Secondary | ICD-10-CM | POA: Diagnosis not present

## 2021-12-01 DIAGNOSIS — C7A8 Other malignant neuroendocrine tumors: Secondary | ICD-10-CM | POA: Diagnosis not present

## 2021-12-01 DIAGNOSIS — J302 Other seasonal allergic rhinitis: Secondary | ICD-10-CM | POA: Diagnosis not present

## 2021-12-01 DIAGNOSIS — E039 Hypothyroidism, unspecified: Secondary | ICD-10-CM | POA: Diagnosis not present

## 2021-12-01 DIAGNOSIS — C786 Secondary malignant neoplasm of retroperitoneum and peritoneum: Secondary | ICD-10-CM | POA: Diagnosis not present

## 2021-12-01 DIAGNOSIS — F32A Depression, unspecified: Secondary | ICD-10-CM | POA: Diagnosis not present

## 2021-12-01 DIAGNOSIS — E785 Hyperlipidemia, unspecified: Secondary | ICD-10-CM | POA: Diagnosis not present

## 2021-12-01 DIAGNOSIS — C787 Secondary malignant neoplasm of liver and intrahepatic bile duct: Secondary | ICD-10-CM | POA: Diagnosis not present

## 2021-12-01 DIAGNOSIS — J449 Chronic obstructive pulmonary disease, unspecified: Secondary | ICD-10-CM | POA: Diagnosis not present

## 2021-12-01 DIAGNOSIS — Z87891 Personal history of nicotine dependence: Secondary | ICD-10-CM | POA: Diagnosis not present

## 2021-12-01 DIAGNOSIS — R0600 Dyspnea, unspecified: Secondary | ICD-10-CM | POA: Diagnosis not present

## 2021-12-01 DIAGNOSIS — N400 Enlarged prostate without lower urinary tract symptoms: Secondary | ICD-10-CM | POA: Diagnosis not present

## 2021-12-01 DIAGNOSIS — Z9981 Dependence on supplemental oxygen: Secondary | ICD-10-CM | POA: Diagnosis not present

## 2021-12-07 DIAGNOSIS — C787 Secondary malignant neoplasm of liver and intrahepatic bile duct: Secondary | ICD-10-CM | POA: Diagnosis not present

## 2021-12-07 DIAGNOSIS — C786 Secondary malignant neoplasm of retroperitoneum and peritoneum: Secondary | ICD-10-CM | POA: Diagnosis not present

## 2021-12-07 DIAGNOSIS — C7A8 Other malignant neuroendocrine tumors: Secondary | ICD-10-CM | POA: Diagnosis not present

## 2021-12-07 DIAGNOSIS — C7951 Secondary malignant neoplasm of bone: Secondary | ICD-10-CM | POA: Diagnosis not present

## 2021-12-07 DIAGNOSIS — R0600 Dyspnea, unspecified: Secondary | ICD-10-CM | POA: Diagnosis not present

## 2021-12-07 DIAGNOSIS — C7880 Secondary malignant neoplasm of unspecified digestive organ: Secondary | ICD-10-CM | POA: Diagnosis not present

## 2021-12-10 DIAGNOSIS — C7951 Secondary malignant neoplasm of bone: Secondary | ICD-10-CM | POA: Diagnosis not present

## 2021-12-10 DIAGNOSIS — R0600 Dyspnea, unspecified: Secondary | ICD-10-CM | POA: Diagnosis not present

## 2021-12-10 DIAGNOSIS — C787 Secondary malignant neoplasm of liver and intrahepatic bile duct: Secondary | ICD-10-CM | POA: Diagnosis not present

## 2021-12-10 DIAGNOSIS — C7A8 Other malignant neuroendocrine tumors: Secondary | ICD-10-CM | POA: Diagnosis not present

## 2021-12-10 DIAGNOSIS — C786 Secondary malignant neoplasm of retroperitoneum and peritoneum: Secondary | ICD-10-CM | POA: Diagnosis not present

## 2021-12-10 DIAGNOSIS — C7880 Secondary malignant neoplasm of unspecified digestive organ: Secondary | ICD-10-CM | POA: Diagnosis not present

## 2021-12-14 DIAGNOSIS — C7880 Secondary malignant neoplasm of unspecified digestive organ: Secondary | ICD-10-CM | POA: Diagnosis not present

## 2021-12-14 DIAGNOSIS — R0600 Dyspnea, unspecified: Secondary | ICD-10-CM | POA: Diagnosis not present

## 2021-12-14 DIAGNOSIS — C7A8 Other malignant neuroendocrine tumors: Secondary | ICD-10-CM | POA: Diagnosis not present

## 2021-12-14 DIAGNOSIS — C787 Secondary malignant neoplasm of liver and intrahepatic bile duct: Secondary | ICD-10-CM | POA: Diagnosis not present

## 2021-12-14 DIAGNOSIS — C786 Secondary malignant neoplasm of retroperitoneum and peritoneum: Secondary | ICD-10-CM | POA: Diagnosis not present

## 2021-12-14 DIAGNOSIS — C7951 Secondary malignant neoplasm of bone: Secondary | ICD-10-CM | POA: Diagnosis not present

## 2021-12-21 DIAGNOSIS — C787 Secondary malignant neoplasm of liver and intrahepatic bile duct: Secondary | ICD-10-CM | POA: Diagnosis not present

## 2021-12-21 DIAGNOSIS — R0600 Dyspnea, unspecified: Secondary | ICD-10-CM | POA: Diagnosis not present

## 2021-12-21 DIAGNOSIS — C7A8 Other malignant neuroendocrine tumors: Secondary | ICD-10-CM | POA: Diagnosis not present

## 2021-12-21 DIAGNOSIS — C7951 Secondary malignant neoplasm of bone: Secondary | ICD-10-CM | POA: Diagnosis not present

## 2021-12-21 DIAGNOSIS — C786 Secondary malignant neoplasm of retroperitoneum and peritoneum: Secondary | ICD-10-CM | POA: Diagnosis not present

## 2021-12-21 DIAGNOSIS — C7880 Secondary malignant neoplasm of unspecified digestive organ: Secondary | ICD-10-CM | POA: Diagnosis not present

## 2021-12-28 DIAGNOSIS — C7880 Secondary malignant neoplasm of unspecified digestive organ: Secondary | ICD-10-CM | POA: Diagnosis not present

## 2021-12-28 DIAGNOSIS — C7951 Secondary malignant neoplasm of bone: Secondary | ICD-10-CM | POA: Diagnosis not present

## 2021-12-28 DIAGNOSIS — R0600 Dyspnea, unspecified: Secondary | ICD-10-CM | POA: Diagnosis not present

## 2021-12-28 DIAGNOSIS — C7A8 Other malignant neuroendocrine tumors: Secondary | ICD-10-CM | POA: Diagnosis not present

## 2021-12-28 DIAGNOSIS — C787 Secondary malignant neoplasm of liver and intrahepatic bile duct: Secondary | ICD-10-CM | POA: Diagnosis not present

## 2021-12-28 DIAGNOSIS — C786 Secondary malignant neoplasm of retroperitoneum and peritoneum: Secondary | ICD-10-CM | POA: Diagnosis not present

## 2021-12-31 DIAGNOSIS — E785 Hyperlipidemia, unspecified: Secondary | ICD-10-CM | POA: Diagnosis not present

## 2021-12-31 DIAGNOSIS — C787 Secondary malignant neoplasm of liver and intrahepatic bile duct: Secondary | ICD-10-CM | POA: Diagnosis not present

## 2021-12-31 DIAGNOSIS — J449 Chronic obstructive pulmonary disease, unspecified: Secondary | ICD-10-CM | POA: Diagnosis not present

## 2021-12-31 DIAGNOSIS — R0600 Dyspnea, unspecified: Secondary | ICD-10-CM | POA: Diagnosis not present

## 2021-12-31 DIAGNOSIS — N400 Enlarged prostate without lower urinary tract symptoms: Secondary | ICD-10-CM | POA: Diagnosis not present

## 2021-12-31 DIAGNOSIS — F32A Depression, unspecified: Secondary | ICD-10-CM | POA: Diagnosis not present

## 2021-12-31 DIAGNOSIS — C7951 Secondary malignant neoplasm of bone: Secondary | ICD-10-CM | POA: Diagnosis not present

## 2021-12-31 DIAGNOSIS — Z87891 Personal history of nicotine dependence: Secondary | ICD-10-CM | POA: Diagnosis not present

## 2021-12-31 DIAGNOSIS — E039 Hypothyroidism, unspecified: Secondary | ICD-10-CM | POA: Diagnosis not present

## 2021-12-31 DIAGNOSIS — J302 Other seasonal allergic rhinitis: Secondary | ICD-10-CM | POA: Diagnosis not present

## 2021-12-31 DIAGNOSIS — C786 Secondary malignant neoplasm of retroperitoneum and peritoneum: Secondary | ICD-10-CM | POA: Diagnosis not present

## 2021-12-31 DIAGNOSIS — C7A8 Other malignant neuroendocrine tumors: Secondary | ICD-10-CM | POA: Diagnosis not present

## 2021-12-31 DIAGNOSIS — R1084 Generalized abdominal pain: Secondary | ICD-10-CM | POA: Diagnosis not present

## 2021-12-31 DIAGNOSIS — Z9981 Dependence on supplemental oxygen: Secondary | ICD-10-CM | POA: Diagnosis not present

## 2021-12-31 DIAGNOSIS — C7880 Secondary malignant neoplasm of unspecified digestive organ: Secondary | ICD-10-CM | POA: Diagnosis not present

## 2022-01-04 DIAGNOSIS — C7A8 Other malignant neuroendocrine tumors: Secondary | ICD-10-CM | POA: Diagnosis not present

## 2022-01-04 DIAGNOSIS — C7880 Secondary malignant neoplasm of unspecified digestive organ: Secondary | ICD-10-CM | POA: Diagnosis not present

## 2022-01-04 DIAGNOSIS — C786 Secondary malignant neoplasm of retroperitoneum and peritoneum: Secondary | ICD-10-CM | POA: Diagnosis not present

## 2022-01-04 DIAGNOSIS — R0600 Dyspnea, unspecified: Secondary | ICD-10-CM | POA: Diagnosis not present

## 2022-01-04 DIAGNOSIS — C7951 Secondary malignant neoplasm of bone: Secondary | ICD-10-CM | POA: Diagnosis not present

## 2022-01-04 DIAGNOSIS — C787 Secondary malignant neoplasm of liver and intrahepatic bile duct: Secondary | ICD-10-CM | POA: Diagnosis not present

## 2022-01-10 DIAGNOSIS — R0600 Dyspnea, unspecified: Secondary | ICD-10-CM | POA: Diagnosis not present

## 2022-01-10 DIAGNOSIS — C786 Secondary malignant neoplasm of retroperitoneum and peritoneum: Secondary | ICD-10-CM | POA: Diagnosis not present

## 2022-01-10 DIAGNOSIS — C7951 Secondary malignant neoplasm of bone: Secondary | ICD-10-CM | POA: Diagnosis not present

## 2022-01-10 DIAGNOSIS — C787 Secondary malignant neoplasm of liver and intrahepatic bile duct: Secondary | ICD-10-CM | POA: Diagnosis not present

## 2022-01-10 DIAGNOSIS — C7A8 Other malignant neuroendocrine tumors: Secondary | ICD-10-CM | POA: Diagnosis not present

## 2022-01-10 DIAGNOSIS — C7880 Secondary malignant neoplasm of unspecified digestive organ: Secondary | ICD-10-CM | POA: Diagnosis not present

## 2022-01-11 DIAGNOSIS — C7A8 Other malignant neuroendocrine tumors: Secondary | ICD-10-CM | POA: Diagnosis not present

## 2022-01-11 DIAGNOSIS — C7951 Secondary malignant neoplasm of bone: Secondary | ICD-10-CM | POA: Diagnosis not present

## 2022-01-11 DIAGNOSIS — R0600 Dyspnea, unspecified: Secondary | ICD-10-CM | POA: Diagnosis not present

## 2022-01-11 DIAGNOSIS — C787 Secondary malignant neoplasm of liver and intrahepatic bile duct: Secondary | ICD-10-CM | POA: Diagnosis not present

## 2022-01-11 DIAGNOSIS — C786 Secondary malignant neoplasm of retroperitoneum and peritoneum: Secondary | ICD-10-CM | POA: Diagnosis not present

## 2022-01-11 DIAGNOSIS — C7880 Secondary malignant neoplasm of unspecified digestive organ: Secondary | ICD-10-CM | POA: Diagnosis not present

## 2022-01-18 DIAGNOSIS — C786 Secondary malignant neoplasm of retroperitoneum and peritoneum: Secondary | ICD-10-CM | POA: Diagnosis not present

## 2022-01-18 DIAGNOSIS — C7A8 Other malignant neuroendocrine tumors: Secondary | ICD-10-CM | POA: Diagnosis not present

## 2022-01-18 DIAGNOSIS — C7880 Secondary malignant neoplasm of unspecified digestive organ: Secondary | ICD-10-CM | POA: Diagnosis not present

## 2022-01-18 DIAGNOSIS — R0600 Dyspnea, unspecified: Secondary | ICD-10-CM | POA: Diagnosis not present

## 2022-01-18 DIAGNOSIS — C7951 Secondary malignant neoplasm of bone: Secondary | ICD-10-CM | POA: Diagnosis not present

## 2022-01-18 DIAGNOSIS — C787 Secondary malignant neoplasm of liver and intrahepatic bile duct: Secondary | ICD-10-CM | POA: Diagnosis not present

## 2022-01-25 DIAGNOSIS — C7A8 Other malignant neuroendocrine tumors: Secondary | ICD-10-CM | POA: Diagnosis not present

## 2022-01-25 DIAGNOSIS — R0600 Dyspnea, unspecified: Secondary | ICD-10-CM | POA: Diagnosis not present

## 2022-01-25 DIAGNOSIS — C7951 Secondary malignant neoplasm of bone: Secondary | ICD-10-CM | POA: Diagnosis not present

## 2022-01-25 DIAGNOSIS — C787 Secondary malignant neoplasm of liver and intrahepatic bile duct: Secondary | ICD-10-CM | POA: Diagnosis not present

## 2022-01-25 DIAGNOSIS — C786 Secondary malignant neoplasm of retroperitoneum and peritoneum: Secondary | ICD-10-CM | POA: Diagnosis not present

## 2022-01-25 DIAGNOSIS — C7880 Secondary malignant neoplasm of unspecified digestive organ: Secondary | ICD-10-CM | POA: Diagnosis not present

## 2022-01-31 DIAGNOSIS — C7951 Secondary malignant neoplasm of bone: Secondary | ICD-10-CM | POA: Diagnosis not present

## 2022-01-31 DIAGNOSIS — Z9981 Dependence on supplemental oxygen: Secondary | ICD-10-CM | POA: Diagnosis not present

## 2022-01-31 DIAGNOSIS — F32A Depression, unspecified: Secondary | ICD-10-CM | POA: Diagnosis not present

## 2022-01-31 DIAGNOSIS — C786 Secondary malignant neoplasm of retroperitoneum and peritoneum: Secondary | ICD-10-CM | POA: Diagnosis not present

## 2022-01-31 DIAGNOSIS — Z87891 Personal history of nicotine dependence: Secondary | ICD-10-CM | POA: Diagnosis not present

## 2022-01-31 DIAGNOSIS — C787 Secondary malignant neoplasm of liver and intrahepatic bile duct: Secondary | ICD-10-CM | POA: Diagnosis not present

## 2022-01-31 DIAGNOSIS — C7A8 Other malignant neuroendocrine tumors: Secondary | ICD-10-CM | POA: Diagnosis not present

## 2022-01-31 DIAGNOSIS — J302 Other seasonal allergic rhinitis: Secondary | ICD-10-CM | POA: Diagnosis not present

## 2022-01-31 DIAGNOSIS — E785 Hyperlipidemia, unspecified: Secondary | ICD-10-CM | POA: Diagnosis not present

## 2022-01-31 DIAGNOSIS — E039 Hypothyroidism, unspecified: Secondary | ICD-10-CM | POA: Diagnosis not present

## 2022-01-31 DIAGNOSIS — R0600 Dyspnea, unspecified: Secondary | ICD-10-CM | POA: Diagnosis not present

## 2022-01-31 DIAGNOSIS — N400 Enlarged prostate without lower urinary tract symptoms: Secondary | ICD-10-CM | POA: Diagnosis not present

## 2022-01-31 DIAGNOSIS — J449 Chronic obstructive pulmonary disease, unspecified: Secondary | ICD-10-CM | POA: Diagnosis not present

## 2022-01-31 DIAGNOSIS — R1084 Generalized abdominal pain: Secondary | ICD-10-CM | POA: Diagnosis not present

## 2022-01-31 DIAGNOSIS — C7880 Secondary malignant neoplasm of unspecified digestive organ: Secondary | ICD-10-CM | POA: Diagnosis not present

## 2022-02-01 DIAGNOSIS — C7A8 Other malignant neuroendocrine tumors: Secondary | ICD-10-CM | POA: Diagnosis not present

## 2022-02-01 DIAGNOSIS — C7951 Secondary malignant neoplasm of bone: Secondary | ICD-10-CM | POA: Diagnosis not present

## 2022-02-01 DIAGNOSIS — C786 Secondary malignant neoplasm of retroperitoneum and peritoneum: Secondary | ICD-10-CM | POA: Diagnosis not present

## 2022-02-01 DIAGNOSIS — C787 Secondary malignant neoplasm of liver and intrahepatic bile duct: Secondary | ICD-10-CM | POA: Diagnosis not present

## 2022-02-01 DIAGNOSIS — C7880 Secondary malignant neoplasm of unspecified digestive organ: Secondary | ICD-10-CM | POA: Diagnosis not present

## 2022-02-01 DIAGNOSIS — R0600 Dyspnea, unspecified: Secondary | ICD-10-CM | POA: Diagnosis not present

## 2022-02-08 DIAGNOSIS — C7880 Secondary malignant neoplasm of unspecified digestive organ: Secondary | ICD-10-CM | POA: Diagnosis not present

## 2022-02-08 DIAGNOSIS — C786 Secondary malignant neoplasm of retroperitoneum and peritoneum: Secondary | ICD-10-CM | POA: Diagnosis not present

## 2022-02-08 DIAGNOSIS — C787 Secondary malignant neoplasm of liver and intrahepatic bile duct: Secondary | ICD-10-CM | POA: Diagnosis not present

## 2022-02-08 DIAGNOSIS — R0600 Dyspnea, unspecified: Secondary | ICD-10-CM | POA: Diagnosis not present

## 2022-02-08 DIAGNOSIS — C7A8 Other malignant neuroendocrine tumors: Secondary | ICD-10-CM | POA: Diagnosis not present

## 2022-02-08 DIAGNOSIS — C7951 Secondary malignant neoplasm of bone: Secondary | ICD-10-CM | POA: Diagnosis not present

## 2022-02-09 DIAGNOSIS — C7951 Secondary malignant neoplasm of bone: Secondary | ICD-10-CM | POA: Diagnosis not present

## 2022-02-09 DIAGNOSIS — R0600 Dyspnea, unspecified: Secondary | ICD-10-CM | POA: Diagnosis not present

## 2022-02-09 DIAGNOSIS — C7880 Secondary malignant neoplasm of unspecified digestive organ: Secondary | ICD-10-CM | POA: Diagnosis not present

## 2022-02-09 DIAGNOSIS — C787 Secondary malignant neoplasm of liver and intrahepatic bile duct: Secondary | ICD-10-CM | POA: Diagnosis not present

## 2022-02-09 DIAGNOSIS — C786 Secondary malignant neoplasm of retroperitoneum and peritoneum: Secondary | ICD-10-CM | POA: Diagnosis not present

## 2022-02-09 DIAGNOSIS — C7A8 Other malignant neuroendocrine tumors: Secondary | ICD-10-CM | POA: Diagnosis not present

## 2022-02-16 DIAGNOSIS — C786 Secondary malignant neoplasm of retroperitoneum and peritoneum: Secondary | ICD-10-CM | POA: Diagnosis not present

## 2022-02-16 DIAGNOSIS — C7A8 Other malignant neuroendocrine tumors: Secondary | ICD-10-CM | POA: Diagnosis not present

## 2022-02-16 DIAGNOSIS — C787 Secondary malignant neoplasm of liver and intrahepatic bile duct: Secondary | ICD-10-CM | POA: Diagnosis not present

## 2022-02-16 DIAGNOSIS — C7951 Secondary malignant neoplasm of bone: Secondary | ICD-10-CM | POA: Diagnosis not present

## 2022-02-16 DIAGNOSIS — R0600 Dyspnea, unspecified: Secondary | ICD-10-CM | POA: Diagnosis not present

## 2022-02-16 DIAGNOSIS — C7880 Secondary malignant neoplasm of unspecified digestive organ: Secondary | ICD-10-CM | POA: Diagnosis not present

## 2022-02-22 DIAGNOSIS — C786 Secondary malignant neoplasm of retroperitoneum and peritoneum: Secondary | ICD-10-CM | POA: Diagnosis not present

## 2022-02-22 DIAGNOSIS — C7A8 Other malignant neuroendocrine tumors: Secondary | ICD-10-CM | POA: Diagnosis not present

## 2022-02-22 DIAGNOSIS — R0600 Dyspnea, unspecified: Secondary | ICD-10-CM | POA: Diagnosis not present

## 2022-02-22 DIAGNOSIS — C7880 Secondary malignant neoplasm of unspecified digestive organ: Secondary | ICD-10-CM | POA: Diagnosis not present

## 2022-02-22 DIAGNOSIS — C787 Secondary malignant neoplasm of liver and intrahepatic bile duct: Secondary | ICD-10-CM | POA: Diagnosis not present

## 2022-02-22 DIAGNOSIS — C7951 Secondary malignant neoplasm of bone: Secondary | ICD-10-CM | POA: Diagnosis not present

## 2022-03-01 DIAGNOSIS — C787 Secondary malignant neoplasm of liver and intrahepatic bile duct: Secondary | ICD-10-CM | POA: Diagnosis not present

## 2022-03-01 DIAGNOSIS — C7A8 Other malignant neuroendocrine tumors: Secondary | ICD-10-CM | POA: Diagnosis not present

## 2022-03-01 DIAGNOSIS — C786 Secondary malignant neoplasm of retroperitoneum and peritoneum: Secondary | ICD-10-CM | POA: Diagnosis not present

## 2022-03-01 DIAGNOSIS — R0600 Dyspnea, unspecified: Secondary | ICD-10-CM | POA: Diagnosis not present

## 2022-03-01 DIAGNOSIS — C7880 Secondary malignant neoplasm of unspecified digestive organ: Secondary | ICD-10-CM | POA: Diagnosis not present

## 2022-03-01 DIAGNOSIS — C7951 Secondary malignant neoplasm of bone: Secondary | ICD-10-CM | POA: Diagnosis not present

## 2022-03-03 DIAGNOSIS — Z9981 Dependence on supplemental oxygen: Secondary | ICD-10-CM | POA: Diagnosis not present

## 2022-03-03 DIAGNOSIS — C7A8 Other malignant neuroendocrine tumors: Secondary | ICD-10-CM | POA: Diagnosis not present

## 2022-03-03 DIAGNOSIS — C7951 Secondary malignant neoplasm of bone: Secondary | ICD-10-CM | POA: Diagnosis not present

## 2022-03-03 DIAGNOSIS — J449 Chronic obstructive pulmonary disease, unspecified: Secondary | ICD-10-CM | POA: Diagnosis not present

## 2022-03-03 DIAGNOSIS — C7880 Secondary malignant neoplasm of unspecified digestive organ: Secondary | ICD-10-CM | POA: Diagnosis not present

## 2022-03-03 DIAGNOSIS — C787 Secondary malignant neoplasm of liver and intrahepatic bile duct: Secondary | ICD-10-CM | POA: Diagnosis not present

## 2022-03-03 DIAGNOSIS — R1084 Generalized abdominal pain: Secondary | ICD-10-CM | POA: Diagnosis not present

## 2022-03-03 DIAGNOSIS — F32A Depression, unspecified: Secondary | ICD-10-CM | POA: Diagnosis not present

## 2022-03-03 DIAGNOSIS — J302 Other seasonal allergic rhinitis: Secondary | ICD-10-CM | POA: Diagnosis not present

## 2022-03-03 DIAGNOSIS — C786 Secondary malignant neoplasm of retroperitoneum and peritoneum: Secondary | ICD-10-CM | POA: Diagnosis not present

## 2022-03-03 DIAGNOSIS — E785 Hyperlipidemia, unspecified: Secondary | ICD-10-CM | POA: Diagnosis not present

## 2022-03-03 DIAGNOSIS — E039 Hypothyroidism, unspecified: Secondary | ICD-10-CM | POA: Diagnosis not present

## 2022-03-03 DIAGNOSIS — R0600 Dyspnea, unspecified: Secondary | ICD-10-CM | POA: Diagnosis not present

## 2022-03-03 DIAGNOSIS — Z87891 Personal history of nicotine dependence: Secondary | ICD-10-CM | POA: Diagnosis not present

## 2022-03-03 DIAGNOSIS — N400 Enlarged prostate without lower urinary tract symptoms: Secondary | ICD-10-CM | POA: Diagnosis not present

## 2022-03-08 DIAGNOSIS — C7951 Secondary malignant neoplasm of bone: Secondary | ICD-10-CM | POA: Diagnosis not present

## 2022-03-08 DIAGNOSIS — C7880 Secondary malignant neoplasm of unspecified digestive organ: Secondary | ICD-10-CM | POA: Diagnosis not present

## 2022-03-08 DIAGNOSIS — C7A8 Other malignant neuroendocrine tumors: Secondary | ICD-10-CM | POA: Diagnosis not present

## 2022-03-08 DIAGNOSIS — R0600 Dyspnea, unspecified: Secondary | ICD-10-CM | POA: Diagnosis not present

## 2022-03-08 DIAGNOSIS — C786 Secondary malignant neoplasm of retroperitoneum and peritoneum: Secondary | ICD-10-CM | POA: Diagnosis not present

## 2022-03-08 DIAGNOSIS — C787 Secondary malignant neoplasm of liver and intrahepatic bile duct: Secondary | ICD-10-CM | POA: Diagnosis not present

## 2022-03-15 DIAGNOSIS — C7A8 Other malignant neuroendocrine tumors: Secondary | ICD-10-CM | POA: Diagnosis not present

## 2022-03-15 DIAGNOSIS — C7951 Secondary malignant neoplasm of bone: Secondary | ICD-10-CM | POA: Diagnosis not present

## 2022-03-15 DIAGNOSIS — R0600 Dyspnea, unspecified: Secondary | ICD-10-CM | POA: Diagnosis not present

## 2022-03-15 DIAGNOSIS — C786 Secondary malignant neoplasm of retroperitoneum and peritoneum: Secondary | ICD-10-CM | POA: Diagnosis not present

## 2022-03-15 DIAGNOSIS — C7880 Secondary malignant neoplasm of unspecified digestive organ: Secondary | ICD-10-CM | POA: Diagnosis not present

## 2022-03-15 DIAGNOSIS — C787 Secondary malignant neoplasm of liver and intrahepatic bile duct: Secondary | ICD-10-CM | POA: Diagnosis not present

## 2022-03-22 DIAGNOSIS — C7880 Secondary malignant neoplasm of unspecified digestive organ: Secondary | ICD-10-CM | POA: Diagnosis not present

## 2022-03-22 DIAGNOSIS — C7A8 Other malignant neuroendocrine tumors: Secondary | ICD-10-CM | POA: Diagnosis not present

## 2022-03-22 DIAGNOSIS — C787 Secondary malignant neoplasm of liver and intrahepatic bile duct: Secondary | ICD-10-CM | POA: Diagnosis not present

## 2022-03-22 DIAGNOSIS — C786 Secondary malignant neoplasm of retroperitoneum and peritoneum: Secondary | ICD-10-CM | POA: Diagnosis not present

## 2022-03-22 DIAGNOSIS — C7951 Secondary malignant neoplasm of bone: Secondary | ICD-10-CM | POA: Diagnosis not present

## 2022-03-22 DIAGNOSIS — R0600 Dyspnea, unspecified: Secondary | ICD-10-CM | POA: Diagnosis not present

## 2022-03-29 DIAGNOSIS — C787 Secondary malignant neoplasm of liver and intrahepatic bile duct: Secondary | ICD-10-CM | POA: Diagnosis not present

## 2022-03-29 DIAGNOSIS — C786 Secondary malignant neoplasm of retroperitoneum and peritoneum: Secondary | ICD-10-CM | POA: Diagnosis not present

## 2022-03-29 DIAGNOSIS — R0600 Dyspnea, unspecified: Secondary | ICD-10-CM | POA: Diagnosis not present

## 2022-03-29 DIAGNOSIS — C7A8 Other malignant neuroendocrine tumors: Secondary | ICD-10-CM | POA: Diagnosis not present

## 2022-03-29 DIAGNOSIS — C7951 Secondary malignant neoplasm of bone: Secondary | ICD-10-CM | POA: Diagnosis not present

## 2022-03-29 DIAGNOSIS — C7880 Secondary malignant neoplasm of unspecified digestive organ: Secondary | ICD-10-CM | POA: Diagnosis not present

## 2022-04-02 DIAGNOSIS — R0600 Dyspnea, unspecified: Secondary | ICD-10-CM | POA: Diagnosis not present

## 2022-04-02 DIAGNOSIS — N400 Enlarged prostate without lower urinary tract symptoms: Secondary | ICD-10-CM | POA: Diagnosis not present

## 2022-04-02 DIAGNOSIS — C7951 Secondary malignant neoplasm of bone: Secondary | ICD-10-CM | POA: Diagnosis not present

## 2022-04-02 DIAGNOSIS — C787 Secondary malignant neoplasm of liver and intrahepatic bile duct: Secondary | ICD-10-CM | POA: Diagnosis not present

## 2022-04-02 DIAGNOSIS — F32A Depression, unspecified: Secondary | ICD-10-CM | POA: Diagnosis not present

## 2022-04-02 DIAGNOSIS — J302 Other seasonal allergic rhinitis: Secondary | ICD-10-CM | POA: Diagnosis not present

## 2022-04-02 DIAGNOSIS — C786 Secondary malignant neoplasm of retroperitoneum and peritoneum: Secondary | ICD-10-CM | POA: Diagnosis not present

## 2022-04-02 DIAGNOSIS — Z87891 Personal history of nicotine dependence: Secondary | ICD-10-CM | POA: Diagnosis not present

## 2022-04-02 DIAGNOSIS — Z9981 Dependence on supplemental oxygen: Secondary | ICD-10-CM | POA: Diagnosis not present

## 2022-04-02 DIAGNOSIS — C7880 Secondary malignant neoplasm of unspecified digestive organ: Secondary | ICD-10-CM | POA: Diagnosis not present

## 2022-04-02 DIAGNOSIS — R1084 Generalized abdominal pain: Secondary | ICD-10-CM | POA: Diagnosis not present

## 2022-04-02 DIAGNOSIS — C7A8 Other malignant neuroendocrine tumors: Secondary | ICD-10-CM | POA: Diagnosis not present

## 2022-04-02 DIAGNOSIS — J449 Chronic obstructive pulmonary disease, unspecified: Secondary | ICD-10-CM | POA: Diagnosis not present

## 2022-04-02 DIAGNOSIS — E785 Hyperlipidemia, unspecified: Secondary | ICD-10-CM | POA: Diagnosis not present

## 2022-04-02 DIAGNOSIS — E039 Hypothyroidism, unspecified: Secondary | ICD-10-CM | POA: Diagnosis not present

## 2022-04-05 DIAGNOSIS — C7951 Secondary malignant neoplasm of bone: Secondary | ICD-10-CM | POA: Diagnosis not present

## 2022-04-05 DIAGNOSIS — C787 Secondary malignant neoplasm of liver and intrahepatic bile duct: Secondary | ICD-10-CM | POA: Diagnosis not present

## 2022-04-05 DIAGNOSIS — R0600 Dyspnea, unspecified: Secondary | ICD-10-CM | POA: Diagnosis not present

## 2022-04-05 DIAGNOSIS — C7880 Secondary malignant neoplasm of unspecified digestive organ: Secondary | ICD-10-CM | POA: Diagnosis not present

## 2022-04-05 DIAGNOSIS — C7A8 Other malignant neuroendocrine tumors: Secondary | ICD-10-CM | POA: Diagnosis not present

## 2022-04-05 DIAGNOSIS — C786 Secondary malignant neoplasm of retroperitoneum and peritoneum: Secondary | ICD-10-CM | POA: Diagnosis not present

## 2022-04-11 DIAGNOSIS — Z23 Encounter for immunization: Secondary | ICD-10-CM | POA: Diagnosis not present

## 2022-04-12 DIAGNOSIS — C7A8 Other malignant neuroendocrine tumors: Secondary | ICD-10-CM | POA: Diagnosis not present

## 2022-04-12 DIAGNOSIS — R0600 Dyspnea, unspecified: Secondary | ICD-10-CM | POA: Diagnosis not present

## 2022-04-12 DIAGNOSIS — C786 Secondary malignant neoplasm of retroperitoneum and peritoneum: Secondary | ICD-10-CM | POA: Diagnosis not present

## 2022-04-12 DIAGNOSIS — C787 Secondary malignant neoplasm of liver and intrahepatic bile duct: Secondary | ICD-10-CM | POA: Diagnosis not present

## 2022-04-12 DIAGNOSIS — C7951 Secondary malignant neoplasm of bone: Secondary | ICD-10-CM | POA: Diagnosis not present

## 2022-04-12 DIAGNOSIS — C7880 Secondary malignant neoplasm of unspecified digestive organ: Secondary | ICD-10-CM | POA: Diagnosis not present

## 2022-04-19 DIAGNOSIS — R0600 Dyspnea, unspecified: Secondary | ICD-10-CM | POA: Diagnosis not present

## 2022-04-19 DIAGNOSIS — C7880 Secondary malignant neoplasm of unspecified digestive organ: Secondary | ICD-10-CM | POA: Diagnosis not present

## 2022-04-19 DIAGNOSIS — C786 Secondary malignant neoplasm of retroperitoneum and peritoneum: Secondary | ICD-10-CM | POA: Diagnosis not present

## 2022-04-19 DIAGNOSIS — C7951 Secondary malignant neoplasm of bone: Secondary | ICD-10-CM | POA: Diagnosis not present

## 2022-04-19 DIAGNOSIS — C7A8 Other malignant neuroendocrine tumors: Secondary | ICD-10-CM | POA: Diagnosis not present

## 2022-04-19 DIAGNOSIS — C787 Secondary malignant neoplasm of liver and intrahepatic bile duct: Secondary | ICD-10-CM | POA: Diagnosis not present

## 2022-04-25 DIAGNOSIS — R0600 Dyspnea, unspecified: Secondary | ICD-10-CM | POA: Diagnosis not present

## 2022-04-25 DIAGNOSIS — C7A8 Other malignant neuroendocrine tumors: Secondary | ICD-10-CM | POA: Diagnosis not present

## 2022-04-25 DIAGNOSIS — C7880 Secondary malignant neoplasm of unspecified digestive organ: Secondary | ICD-10-CM | POA: Diagnosis not present

## 2022-04-25 DIAGNOSIS — C786 Secondary malignant neoplasm of retroperitoneum and peritoneum: Secondary | ICD-10-CM | POA: Diagnosis not present

## 2022-04-25 DIAGNOSIS — C787 Secondary malignant neoplasm of liver and intrahepatic bile duct: Secondary | ICD-10-CM | POA: Diagnosis not present

## 2022-04-25 DIAGNOSIS — C7951 Secondary malignant neoplasm of bone: Secondary | ICD-10-CM | POA: Diagnosis not present

## 2022-05-03 DIAGNOSIS — C7A8 Other malignant neuroendocrine tumors: Secondary | ICD-10-CM | POA: Diagnosis not present

## 2022-05-03 DIAGNOSIS — Z87891 Personal history of nicotine dependence: Secondary | ICD-10-CM | POA: Diagnosis not present

## 2022-05-03 DIAGNOSIS — C7951 Secondary malignant neoplasm of bone: Secondary | ICD-10-CM | POA: Diagnosis not present

## 2022-05-03 DIAGNOSIS — F32A Depression, unspecified: Secondary | ICD-10-CM | POA: Diagnosis not present

## 2022-05-03 DIAGNOSIS — R0600 Dyspnea, unspecified: Secondary | ICD-10-CM | POA: Diagnosis not present

## 2022-05-03 DIAGNOSIS — J449 Chronic obstructive pulmonary disease, unspecified: Secondary | ICD-10-CM | POA: Diagnosis not present

## 2022-05-03 DIAGNOSIS — E039 Hypothyroidism, unspecified: Secondary | ICD-10-CM | POA: Diagnosis not present

## 2022-05-03 DIAGNOSIS — J302 Other seasonal allergic rhinitis: Secondary | ICD-10-CM | POA: Diagnosis not present

## 2022-05-03 DIAGNOSIS — C7880 Secondary malignant neoplasm of unspecified digestive organ: Secondary | ICD-10-CM | POA: Diagnosis not present

## 2022-05-03 DIAGNOSIS — N400 Enlarged prostate without lower urinary tract symptoms: Secondary | ICD-10-CM | POA: Diagnosis not present

## 2022-05-03 DIAGNOSIS — R1084 Generalized abdominal pain: Secondary | ICD-10-CM | POA: Diagnosis not present

## 2022-05-03 DIAGNOSIS — C787 Secondary malignant neoplasm of liver and intrahepatic bile duct: Secondary | ICD-10-CM | POA: Diagnosis not present

## 2022-05-03 DIAGNOSIS — C786 Secondary malignant neoplasm of retroperitoneum and peritoneum: Secondary | ICD-10-CM | POA: Diagnosis not present

## 2022-05-03 DIAGNOSIS — Z9981 Dependence on supplemental oxygen: Secondary | ICD-10-CM | POA: Diagnosis not present

## 2022-05-03 DIAGNOSIS — E785 Hyperlipidemia, unspecified: Secondary | ICD-10-CM | POA: Diagnosis not present

## 2022-05-10 DIAGNOSIS — C7951 Secondary malignant neoplasm of bone: Secondary | ICD-10-CM | POA: Diagnosis not present

## 2022-05-10 DIAGNOSIS — C786 Secondary malignant neoplasm of retroperitoneum and peritoneum: Secondary | ICD-10-CM | POA: Diagnosis not present

## 2022-05-10 DIAGNOSIS — R0600 Dyspnea, unspecified: Secondary | ICD-10-CM | POA: Diagnosis not present

## 2022-05-10 DIAGNOSIS — C7880 Secondary malignant neoplasm of unspecified digestive organ: Secondary | ICD-10-CM | POA: Diagnosis not present

## 2022-05-10 DIAGNOSIS — C7A8 Other malignant neuroendocrine tumors: Secondary | ICD-10-CM | POA: Diagnosis not present

## 2022-05-10 DIAGNOSIS — C787 Secondary malignant neoplasm of liver and intrahepatic bile duct: Secondary | ICD-10-CM | POA: Diagnosis not present

## 2022-05-12 DIAGNOSIS — R0600 Dyspnea, unspecified: Secondary | ICD-10-CM | POA: Diagnosis not present

## 2022-05-12 DIAGNOSIS — C7A8 Other malignant neuroendocrine tumors: Secondary | ICD-10-CM | POA: Diagnosis not present

## 2022-05-12 DIAGNOSIS — C787 Secondary malignant neoplasm of liver and intrahepatic bile duct: Secondary | ICD-10-CM | POA: Diagnosis not present

## 2022-05-12 DIAGNOSIS — C7951 Secondary malignant neoplasm of bone: Secondary | ICD-10-CM | POA: Diagnosis not present

## 2022-05-12 DIAGNOSIS — C7880 Secondary malignant neoplasm of unspecified digestive organ: Secondary | ICD-10-CM | POA: Diagnosis not present

## 2022-05-12 DIAGNOSIS — C786 Secondary malignant neoplasm of retroperitoneum and peritoneum: Secondary | ICD-10-CM | POA: Diagnosis not present

## 2022-05-17 DIAGNOSIS — C7880 Secondary malignant neoplasm of unspecified digestive organ: Secondary | ICD-10-CM | POA: Diagnosis not present

## 2022-05-17 DIAGNOSIS — R0600 Dyspnea, unspecified: Secondary | ICD-10-CM | POA: Diagnosis not present

## 2022-05-17 DIAGNOSIS — C786 Secondary malignant neoplasm of retroperitoneum and peritoneum: Secondary | ICD-10-CM | POA: Diagnosis not present

## 2022-05-17 DIAGNOSIS — C787 Secondary malignant neoplasm of liver and intrahepatic bile duct: Secondary | ICD-10-CM | POA: Diagnosis not present

## 2022-05-17 DIAGNOSIS — C7A8 Other malignant neuroendocrine tumors: Secondary | ICD-10-CM | POA: Diagnosis not present

## 2022-05-17 DIAGNOSIS — C7951 Secondary malignant neoplasm of bone: Secondary | ICD-10-CM | POA: Diagnosis not present

## 2022-05-24 DIAGNOSIS — R0600 Dyspnea, unspecified: Secondary | ICD-10-CM | POA: Diagnosis not present

## 2022-05-24 DIAGNOSIS — C7951 Secondary malignant neoplasm of bone: Secondary | ICD-10-CM | POA: Diagnosis not present

## 2022-05-24 DIAGNOSIS — C786 Secondary malignant neoplasm of retroperitoneum and peritoneum: Secondary | ICD-10-CM | POA: Diagnosis not present

## 2022-05-24 DIAGNOSIS — C7880 Secondary malignant neoplasm of unspecified digestive organ: Secondary | ICD-10-CM | POA: Diagnosis not present

## 2022-05-24 DIAGNOSIS — C787 Secondary malignant neoplasm of liver and intrahepatic bile duct: Secondary | ICD-10-CM | POA: Diagnosis not present

## 2022-05-24 DIAGNOSIS — C7A8 Other malignant neuroendocrine tumors: Secondary | ICD-10-CM | POA: Diagnosis not present

## 2022-05-31 DIAGNOSIS — R0600 Dyspnea, unspecified: Secondary | ICD-10-CM | POA: Diagnosis not present

## 2022-05-31 DIAGNOSIS — C7951 Secondary malignant neoplasm of bone: Secondary | ICD-10-CM | POA: Diagnosis not present

## 2022-05-31 DIAGNOSIS — C787 Secondary malignant neoplasm of liver and intrahepatic bile duct: Secondary | ICD-10-CM | POA: Diagnosis not present

## 2022-05-31 DIAGNOSIS — C7A8 Other malignant neuroendocrine tumors: Secondary | ICD-10-CM | POA: Diagnosis not present

## 2022-05-31 DIAGNOSIS — C7880 Secondary malignant neoplasm of unspecified digestive organ: Secondary | ICD-10-CM | POA: Diagnosis not present

## 2022-05-31 DIAGNOSIS — C786 Secondary malignant neoplasm of retroperitoneum and peritoneum: Secondary | ICD-10-CM | POA: Diagnosis not present

## 2022-06-02 DIAGNOSIS — C7880 Secondary malignant neoplasm of unspecified digestive organ: Secondary | ICD-10-CM | POA: Diagnosis not present

## 2022-06-02 DIAGNOSIS — C786 Secondary malignant neoplasm of retroperitoneum and peritoneum: Secondary | ICD-10-CM | POA: Diagnosis not present

## 2022-06-02 DIAGNOSIS — C7951 Secondary malignant neoplasm of bone: Secondary | ICD-10-CM | POA: Diagnosis not present

## 2022-06-02 DIAGNOSIS — J302 Other seasonal allergic rhinitis: Secondary | ICD-10-CM | POA: Diagnosis not present

## 2022-06-02 DIAGNOSIS — R1084 Generalized abdominal pain: Secondary | ICD-10-CM | POA: Diagnosis not present

## 2022-06-02 DIAGNOSIS — J449 Chronic obstructive pulmonary disease, unspecified: Secondary | ICD-10-CM | POA: Diagnosis not present

## 2022-06-02 DIAGNOSIS — Z9981 Dependence on supplemental oxygen: Secondary | ICD-10-CM | POA: Diagnosis not present

## 2022-06-02 DIAGNOSIS — C7A8 Other malignant neuroendocrine tumors: Secondary | ICD-10-CM | POA: Diagnosis not present

## 2022-06-02 DIAGNOSIS — F32A Depression, unspecified: Secondary | ICD-10-CM | POA: Diagnosis not present

## 2022-06-02 DIAGNOSIS — E039 Hypothyroidism, unspecified: Secondary | ICD-10-CM | POA: Diagnosis not present

## 2022-06-02 DIAGNOSIS — C787 Secondary malignant neoplasm of liver and intrahepatic bile duct: Secondary | ICD-10-CM | POA: Diagnosis not present

## 2022-06-02 DIAGNOSIS — E785 Hyperlipidemia, unspecified: Secondary | ICD-10-CM | POA: Diagnosis not present

## 2022-06-02 DIAGNOSIS — R0600 Dyspnea, unspecified: Secondary | ICD-10-CM | POA: Diagnosis not present

## 2022-06-02 DIAGNOSIS — N400 Enlarged prostate without lower urinary tract symptoms: Secondary | ICD-10-CM | POA: Diagnosis not present

## 2022-06-02 DIAGNOSIS — Z87891 Personal history of nicotine dependence: Secondary | ICD-10-CM | POA: Diagnosis not present

## 2022-06-07 DIAGNOSIS — C787 Secondary malignant neoplasm of liver and intrahepatic bile duct: Secondary | ICD-10-CM | POA: Diagnosis not present

## 2022-06-07 DIAGNOSIS — C786 Secondary malignant neoplasm of retroperitoneum and peritoneum: Secondary | ICD-10-CM | POA: Diagnosis not present

## 2022-06-07 DIAGNOSIS — C7880 Secondary malignant neoplasm of unspecified digestive organ: Secondary | ICD-10-CM | POA: Diagnosis not present

## 2022-06-07 DIAGNOSIS — C7951 Secondary malignant neoplasm of bone: Secondary | ICD-10-CM | POA: Diagnosis not present

## 2022-06-07 DIAGNOSIS — R0600 Dyspnea, unspecified: Secondary | ICD-10-CM | POA: Diagnosis not present

## 2022-06-07 DIAGNOSIS — C7A8 Other malignant neuroendocrine tumors: Secondary | ICD-10-CM | POA: Diagnosis not present

## 2022-06-09 DIAGNOSIS — R0600 Dyspnea, unspecified: Secondary | ICD-10-CM | POA: Diagnosis not present

## 2022-06-09 DIAGNOSIS — C7951 Secondary malignant neoplasm of bone: Secondary | ICD-10-CM | POA: Diagnosis not present

## 2022-06-09 DIAGNOSIS — C786 Secondary malignant neoplasm of retroperitoneum and peritoneum: Secondary | ICD-10-CM | POA: Diagnosis not present

## 2022-06-09 DIAGNOSIS — C7A8 Other malignant neuroendocrine tumors: Secondary | ICD-10-CM | POA: Diagnosis not present

## 2022-06-09 DIAGNOSIS — C787 Secondary malignant neoplasm of liver and intrahepatic bile duct: Secondary | ICD-10-CM | POA: Diagnosis not present

## 2022-06-09 DIAGNOSIS — C7880 Secondary malignant neoplasm of unspecified digestive organ: Secondary | ICD-10-CM | POA: Diagnosis not present

## 2022-06-14 DIAGNOSIS — C787 Secondary malignant neoplasm of liver and intrahepatic bile duct: Secondary | ICD-10-CM | POA: Diagnosis not present

## 2022-06-14 DIAGNOSIS — C7951 Secondary malignant neoplasm of bone: Secondary | ICD-10-CM | POA: Diagnosis not present

## 2022-06-14 DIAGNOSIS — C7A8 Other malignant neuroendocrine tumors: Secondary | ICD-10-CM | POA: Diagnosis not present

## 2022-06-14 DIAGNOSIS — C786 Secondary malignant neoplasm of retroperitoneum and peritoneum: Secondary | ICD-10-CM | POA: Diagnosis not present

## 2022-06-14 DIAGNOSIS — R0600 Dyspnea, unspecified: Secondary | ICD-10-CM | POA: Diagnosis not present

## 2022-06-14 DIAGNOSIS — C7880 Secondary malignant neoplasm of unspecified digestive organ: Secondary | ICD-10-CM | POA: Diagnosis not present

## 2022-06-21 DIAGNOSIS — R0600 Dyspnea, unspecified: Secondary | ICD-10-CM | POA: Diagnosis not present

## 2022-06-21 DIAGNOSIS — C787 Secondary malignant neoplasm of liver and intrahepatic bile duct: Secondary | ICD-10-CM | POA: Diagnosis not present

## 2022-06-21 DIAGNOSIS — C7A8 Other malignant neuroendocrine tumors: Secondary | ICD-10-CM | POA: Diagnosis not present

## 2022-06-21 DIAGNOSIS — C786 Secondary malignant neoplasm of retroperitoneum and peritoneum: Secondary | ICD-10-CM | POA: Diagnosis not present

## 2022-06-21 DIAGNOSIS — C7951 Secondary malignant neoplasm of bone: Secondary | ICD-10-CM | POA: Diagnosis not present

## 2022-06-21 DIAGNOSIS — C7880 Secondary malignant neoplasm of unspecified digestive organ: Secondary | ICD-10-CM | POA: Diagnosis not present

## 2022-06-28 DIAGNOSIS — C787 Secondary malignant neoplasm of liver and intrahepatic bile duct: Secondary | ICD-10-CM | POA: Diagnosis not present

## 2022-06-28 DIAGNOSIS — R0600 Dyspnea, unspecified: Secondary | ICD-10-CM | POA: Diagnosis not present

## 2022-06-28 DIAGNOSIS — C786 Secondary malignant neoplasm of retroperitoneum and peritoneum: Secondary | ICD-10-CM | POA: Diagnosis not present

## 2022-06-28 DIAGNOSIS — C7880 Secondary malignant neoplasm of unspecified digestive organ: Secondary | ICD-10-CM | POA: Diagnosis not present

## 2022-06-28 DIAGNOSIS — C7A8 Other malignant neuroendocrine tumors: Secondary | ICD-10-CM | POA: Diagnosis not present

## 2022-06-28 DIAGNOSIS — C7951 Secondary malignant neoplasm of bone: Secondary | ICD-10-CM | POA: Diagnosis not present

## 2022-06-29 DIAGNOSIS — R0600 Dyspnea, unspecified: Secondary | ICD-10-CM | POA: Diagnosis not present

## 2022-06-29 DIAGNOSIS — C787 Secondary malignant neoplasm of liver and intrahepatic bile duct: Secondary | ICD-10-CM | POA: Diagnosis not present

## 2022-06-29 DIAGNOSIS — C7951 Secondary malignant neoplasm of bone: Secondary | ICD-10-CM | POA: Diagnosis not present

## 2022-06-29 DIAGNOSIS — C7A8 Other malignant neuroendocrine tumors: Secondary | ICD-10-CM | POA: Diagnosis not present

## 2022-06-29 DIAGNOSIS — C786 Secondary malignant neoplasm of retroperitoneum and peritoneum: Secondary | ICD-10-CM | POA: Diagnosis not present

## 2022-06-29 DIAGNOSIS — C7880 Secondary malignant neoplasm of unspecified digestive organ: Secondary | ICD-10-CM | POA: Diagnosis not present

## 2022-07-03 DIAGNOSIS — C7951 Secondary malignant neoplasm of bone: Secondary | ICD-10-CM | POA: Diagnosis not present

## 2022-07-03 DIAGNOSIS — R1084 Generalized abdominal pain: Secondary | ICD-10-CM | POA: Diagnosis not present

## 2022-07-03 DIAGNOSIS — J302 Other seasonal allergic rhinitis: Secondary | ICD-10-CM | POA: Diagnosis not present

## 2022-07-03 DIAGNOSIS — J449 Chronic obstructive pulmonary disease, unspecified: Secondary | ICD-10-CM | POA: Diagnosis not present

## 2022-07-03 DIAGNOSIS — Z9981 Dependence on supplemental oxygen: Secondary | ICD-10-CM | POA: Diagnosis not present

## 2022-07-03 DIAGNOSIS — E039 Hypothyroidism, unspecified: Secondary | ICD-10-CM | POA: Diagnosis not present

## 2022-07-03 DIAGNOSIS — R0600 Dyspnea, unspecified: Secondary | ICD-10-CM | POA: Diagnosis not present

## 2022-07-03 DIAGNOSIS — N400 Enlarged prostate without lower urinary tract symptoms: Secondary | ICD-10-CM | POA: Diagnosis not present

## 2022-07-03 DIAGNOSIS — E785 Hyperlipidemia, unspecified: Secondary | ICD-10-CM | POA: Diagnosis not present

## 2022-07-03 DIAGNOSIS — Z87891 Personal history of nicotine dependence: Secondary | ICD-10-CM | POA: Diagnosis not present

## 2022-07-03 DIAGNOSIS — C7880 Secondary malignant neoplasm of unspecified digestive organ: Secondary | ICD-10-CM | POA: Diagnosis not present

## 2022-07-03 DIAGNOSIS — C786 Secondary malignant neoplasm of retroperitoneum and peritoneum: Secondary | ICD-10-CM | POA: Diagnosis not present

## 2022-07-03 DIAGNOSIS — C7A8 Other malignant neuroendocrine tumors: Secondary | ICD-10-CM | POA: Diagnosis not present

## 2022-07-03 DIAGNOSIS — F32A Depression, unspecified: Secondary | ICD-10-CM | POA: Diagnosis not present

## 2022-07-03 DIAGNOSIS — C787 Secondary malignant neoplasm of liver and intrahepatic bile duct: Secondary | ICD-10-CM | POA: Diagnosis not present

## 2022-07-04 DIAGNOSIS — C7A8 Other malignant neuroendocrine tumors: Secondary | ICD-10-CM | POA: Diagnosis not present

## 2022-07-04 DIAGNOSIS — C787 Secondary malignant neoplasm of liver and intrahepatic bile duct: Secondary | ICD-10-CM | POA: Diagnosis not present

## 2022-07-04 DIAGNOSIS — C786 Secondary malignant neoplasm of retroperitoneum and peritoneum: Secondary | ICD-10-CM | POA: Diagnosis not present

## 2022-07-04 DIAGNOSIS — C7880 Secondary malignant neoplasm of unspecified digestive organ: Secondary | ICD-10-CM | POA: Diagnosis not present

## 2022-07-04 DIAGNOSIS — R0600 Dyspnea, unspecified: Secondary | ICD-10-CM | POA: Diagnosis not present

## 2022-07-04 DIAGNOSIS — C7951 Secondary malignant neoplasm of bone: Secondary | ICD-10-CM | POA: Diagnosis not present

## 2022-07-07 DIAGNOSIS — C7951 Secondary malignant neoplasm of bone: Secondary | ICD-10-CM | POA: Diagnosis not present

## 2022-07-07 DIAGNOSIS — C786 Secondary malignant neoplasm of retroperitoneum and peritoneum: Secondary | ICD-10-CM | POA: Diagnosis not present

## 2022-07-07 DIAGNOSIS — R0600 Dyspnea, unspecified: Secondary | ICD-10-CM | POA: Diagnosis not present

## 2022-07-07 DIAGNOSIS — C7880 Secondary malignant neoplasm of unspecified digestive organ: Secondary | ICD-10-CM | POA: Diagnosis not present

## 2022-07-07 DIAGNOSIS — C7A8 Other malignant neuroendocrine tumors: Secondary | ICD-10-CM | POA: Diagnosis not present

## 2022-07-07 DIAGNOSIS — C787 Secondary malignant neoplasm of liver and intrahepatic bile duct: Secondary | ICD-10-CM | POA: Diagnosis not present

## 2022-07-12 DIAGNOSIS — C7880 Secondary malignant neoplasm of unspecified digestive organ: Secondary | ICD-10-CM | POA: Diagnosis not present

## 2022-07-12 DIAGNOSIS — C7951 Secondary malignant neoplasm of bone: Secondary | ICD-10-CM | POA: Diagnosis not present

## 2022-07-12 DIAGNOSIS — C786 Secondary malignant neoplasm of retroperitoneum and peritoneum: Secondary | ICD-10-CM | POA: Diagnosis not present

## 2022-07-12 DIAGNOSIS — C787 Secondary malignant neoplasm of liver and intrahepatic bile duct: Secondary | ICD-10-CM | POA: Diagnosis not present

## 2022-07-12 DIAGNOSIS — C7A8 Other malignant neuroendocrine tumors: Secondary | ICD-10-CM | POA: Diagnosis not present

## 2022-07-12 DIAGNOSIS — R0600 Dyspnea, unspecified: Secondary | ICD-10-CM | POA: Diagnosis not present

## 2022-07-13 DIAGNOSIS — C786 Secondary malignant neoplasm of retroperitoneum and peritoneum: Secondary | ICD-10-CM | POA: Diagnosis not present

## 2022-07-13 DIAGNOSIS — C7A8 Other malignant neuroendocrine tumors: Secondary | ICD-10-CM | POA: Diagnosis not present

## 2022-07-13 DIAGNOSIS — R0600 Dyspnea, unspecified: Secondary | ICD-10-CM | POA: Diagnosis not present

## 2022-07-13 DIAGNOSIS — C7880 Secondary malignant neoplasm of unspecified digestive organ: Secondary | ICD-10-CM | POA: Diagnosis not present

## 2022-07-13 DIAGNOSIS — C7951 Secondary malignant neoplasm of bone: Secondary | ICD-10-CM | POA: Diagnosis not present

## 2022-07-13 DIAGNOSIS — C787 Secondary malignant neoplasm of liver and intrahepatic bile duct: Secondary | ICD-10-CM | POA: Diagnosis not present

## 2022-07-19 DIAGNOSIS — C786 Secondary malignant neoplasm of retroperitoneum and peritoneum: Secondary | ICD-10-CM | POA: Diagnosis not present

## 2022-07-19 DIAGNOSIS — C7951 Secondary malignant neoplasm of bone: Secondary | ICD-10-CM | POA: Diagnosis not present

## 2022-07-19 DIAGNOSIS — C7A8 Other malignant neuroendocrine tumors: Secondary | ICD-10-CM | POA: Diagnosis not present

## 2022-07-19 DIAGNOSIS — C787 Secondary malignant neoplasm of liver and intrahepatic bile duct: Secondary | ICD-10-CM | POA: Diagnosis not present

## 2022-07-19 DIAGNOSIS — C7880 Secondary malignant neoplasm of unspecified digestive organ: Secondary | ICD-10-CM | POA: Diagnosis not present

## 2022-07-19 DIAGNOSIS — R0600 Dyspnea, unspecified: Secondary | ICD-10-CM | POA: Diagnosis not present

## 2022-07-27 DIAGNOSIS — C7951 Secondary malignant neoplasm of bone: Secondary | ICD-10-CM | POA: Diagnosis not present

## 2022-07-27 DIAGNOSIS — R0600 Dyspnea, unspecified: Secondary | ICD-10-CM | POA: Diagnosis not present

## 2022-07-27 DIAGNOSIS — C786 Secondary malignant neoplasm of retroperitoneum and peritoneum: Secondary | ICD-10-CM | POA: Diagnosis not present

## 2022-07-27 DIAGNOSIS — C7880 Secondary malignant neoplasm of unspecified digestive organ: Secondary | ICD-10-CM | POA: Diagnosis not present

## 2022-07-27 DIAGNOSIS — C787 Secondary malignant neoplasm of liver and intrahepatic bile duct: Secondary | ICD-10-CM | POA: Diagnosis not present

## 2022-07-27 DIAGNOSIS — C7A8 Other malignant neuroendocrine tumors: Secondary | ICD-10-CM | POA: Diagnosis not present

## 2022-08-02 DIAGNOSIS — C7A8 Other malignant neuroendocrine tumors: Secondary | ICD-10-CM | POA: Diagnosis not present

## 2022-08-02 DIAGNOSIS — R0600 Dyspnea, unspecified: Secondary | ICD-10-CM | POA: Diagnosis not present

## 2022-08-02 DIAGNOSIS — C7951 Secondary malignant neoplasm of bone: Secondary | ICD-10-CM | POA: Diagnosis not present

## 2022-08-02 DIAGNOSIS — C7880 Secondary malignant neoplasm of unspecified digestive organ: Secondary | ICD-10-CM | POA: Diagnosis not present

## 2022-08-02 DIAGNOSIS — C786 Secondary malignant neoplasm of retroperitoneum and peritoneum: Secondary | ICD-10-CM | POA: Diagnosis not present

## 2022-08-02 DIAGNOSIS — C787 Secondary malignant neoplasm of liver and intrahepatic bile duct: Secondary | ICD-10-CM | POA: Diagnosis not present

## 2022-08-03 DIAGNOSIS — E039 Hypothyroidism, unspecified: Secondary | ICD-10-CM | POA: Diagnosis not present

## 2022-08-03 DIAGNOSIS — Z9981 Dependence on supplemental oxygen: Secondary | ICD-10-CM | POA: Diagnosis not present

## 2022-08-03 DIAGNOSIS — J449 Chronic obstructive pulmonary disease, unspecified: Secondary | ICD-10-CM | POA: Diagnosis not present

## 2022-08-03 DIAGNOSIS — R0600 Dyspnea, unspecified: Secondary | ICD-10-CM | POA: Diagnosis not present

## 2022-08-03 DIAGNOSIS — C7880 Secondary malignant neoplasm of unspecified digestive organ: Secondary | ICD-10-CM | POA: Diagnosis not present

## 2022-08-03 DIAGNOSIS — N400 Enlarged prostate without lower urinary tract symptoms: Secondary | ICD-10-CM | POA: Diagnosis not present

## 2022-08-03 DIAGNOSIS — F32A Depression, unspecified: Secondary | ICD-10-CM | POA: Diagnosis not present

## 2022-08-03 DIAGNOSIS — C7A8 Other malignant neuroendocrine tumors: Secondary | ICD-10-CM | POA: Diagnosis not present

## 2022-08-03 DIAGNOSIS — E785 Hyperlipidemia, unspecified: Secondary | ICD-10-CM | POA: Diagnosis not present

## 2022-08-03 DIAGNOSIS — Z87891 Personal history of nicotine dependence: Secondary | ICD-10-CM | POA: Diagnosis not present

## 2022-08-03 DIAGNOSIS — C787 Secondary malignant neoplasm of liver and intrahepatic bile duct: Secondary | ICD-10-CM | POA: Diagnosis not present

## 2022-08-03 DIAGNOSIS — C786 Secondary malignant neoplasm of retroperitoneum and peritoneum: Secondary | ICD-10-CM | POA: Diagnosis not present

## 2022-08-03 DIAGNOSIS — J302 Other seasonal allergic rhinitis: Secondary | ICD-10-CM | POA: Diagnosis not present

## 2022-08-03 DIAGNOSIS — C7951 Secondary malignant neoplasm of bone: Secondary | ICD-10-CM | POA: Diagnosis not present

## 2022-08-03 DIAGNOSIS — R1084 Generalized abdominal pain: Secondary | ICD-10-CM | POA: Diagnosis not present

## 2022-08-09 DIAGNOSIS — C7880 Secondary malignant neoplasm of unspecified digestive organ: Secondary | ICD-10-CM | POA: Diagnosis not present

## 2022-08-09 DIAGNOSIS — C786 Secondary malignant neoplasm of retroperitoneum and peritoneum: Secondary | ICD-10-CM | POA: Diagnosis not present

## 2022-08-09 DIAGNOSIS — C787 Secondary malignant neoplasm of liver and intrahepatic bile duct: Secondary | ICD-10-CM | POA: Diagnosis not present

## 2022-08-09 DIAGNOSIS — R0600 Dyspnea, unspecified: Secondary | ICD-10-CM | POA: Diagnosis not present

## 2022-08-09 DIAGNOSIS — C7951 Secondary malignant neoplasm of bone: Secondary | ICD-10-CM | POA: Diagnosis not present

## 2022-08-09 DIAGNOSIS — C7A8 Other malignant neuroendocrine tumors: Secondary | ICD-10-CM | POA: Diagnosis not present

## 2022-08-11 DIAGNOSIS — C7A8 Other malignant neuroendocrine tumors: Secondary | ICD-10-CM | POA: Diagnosis not present

## 2022-08-11 DIAGNOSIS — C7880 Secondary malignant neoplasm of unspecified digestive organ: Secondary | ICD-10-CM | POA: Diagnosis not present

## 2022-08-11 DIAGNOSIS — R0600 Dyspnea, unspecified: Secondary | ICD-10-CM | POA: Diagnosis not present

## 2022-08-11 DIAGNOSIS — C786 Secondary malignant neoplasm of retroperitoneum and peritoneum: Secondary | ICD-10-CM | POA: Diagnosis not present

## 2022-08-11 DIAGNOSIS — C787 Secondary malignant neoplasm of liver and intrahepatic bile duct: Secondary | ICD-10-CM | POA: Diagnosis not present

## 2022-08-11 DIAGNOSIS — C7951 Secondary malignant neoplasm of bone: Secondary | ICD-10-CM | POA: Diagnosis not present

## 2022-08-16 DIAGNOSIS — R0600 Dyspnea, unspecified: Secondary | ICD-10-CM | POA: Diagnosis not present

## 2022-08-16 DIAGNOSIS — C7880 Secondary malignant neoplasm of unspecified digestive organ: Secondary | ICD-10-CM | POA: Diagnosis not present

## 2022-08-16 DIAGNOSIS — C787 Secondary malignant neoplasm of liver and intrahepatic bile duct: Secondary | ICD-10-CM | POA: Diagnosis not present

## 2022-08-16 DIAGNOSIS — C786 Secondary malignant neoplasm of retroperitoneum and peritoneum: Secondary | ICD-10-CM | POA: Diagnosis not present

## 2022-08-16 DIAGNOSIS — C7951 Secondary malignant neoplasm of bone: Secondary | ICD-10-CM | POA: Diagnosis not present

## 2022-08-16 DIAGNOSIS — C7A8 Other malignant neuroendocrine tumors: Secondary | ICD-10-CM | POA: Diagnosis not present

## 2022-08-23 DIAGNOSIS — C787 Secondary malignant neoplasm of liver and intrahepatic bile duct: Secondary | ICD-10-CM | POA: Diagnosis not present

## 2022-08-23 DIAGNOSIS — C786 Secondary malignant neoplasm of retroperitoneum and peritoneum: Secondary | ICD-10-CM | POA: Diagnosis not present

## 2022-08-23 DIAGNOSIS — C7951 Secondary malignant neoplasm of bone: Secondary | ICD-10-CM | POA: Diagnosis not present

## 2022-08-23 DIAGNOSIS — C7880 Secondary malignant neoplasm of unspecified digestive organ: Secondary | ICD-10-CM | POA: Diagnosis not present

## 2022-08-23 DIAGNOSIS — R0600 Dyspnea, unspecified: Secondary | ICD-10-CM | POA: Diagnosis not present

## 2022-08-23 DIAGNOSIS — C7A8 Other malignant neuroendocrine tumors: Secondary | ICD-10-CM | POA: Diagnosis not present

## 2022-08-30 DIAGNOSIS — C7880 Secondary malignant neoplasm of unspecified digestive organ: Secondary | ICD-10-CM | POA: Diagnosis not present

## 2022-08-30 DIAGNOSIS — R0600 Dyspnea, unspecified: Secondary | ICD-10-CM | POA: Diagnosis not present

## 2022-08-30 DIAGNOSIS — C7951 Secondary malignant neoplasm of bone: Secondary | ICD-10-CM | POA: Diagnosis not present

## 2022-08-30 DIAGNOSIS — C787 Secondary malignant neoplasm of liver and intrahepatic bile duct: Secondary | ICD-10-CM | POA: Diagnosis not present

## 2022-08-30 DIAGNOSIS — C7A8 Other malignant neuroendocrine tumors: Secondary | ICD-10-CM | POA: Diagnosis not present

## 2022-08-30 DIAGNOSIS — C786 Secondary malignant neoplasm of retroperitoneum and peritoneum: Secondary | ICD-10-CM | POA: Diagnosis not present

## 2022-09-01 DIAGNOSIS — Z87891 Personal history of nicotine dependence: Secondary | ICD-10-CM | POA: Diagnosis not present

## 2022-09-01 DIAGNOSIS — C787 Secondary malignant neoplasm of liver and intrahepatic bile duct: Secondary | ICD-10-CM | POA: Diagnosis not present

## 2022-09-01 DIAGNOSIS — Z9981 Dependence on supplemental oxygen: Secondary | ICD-10-CM | POA: Diagnosis not present

## 2022-09-01 DIAGNOSIS — C7A8 Other malignant neuroendocrine tumors: Secondary | ICD-10-CM | POA: Diagnosis not present

## 2022-09-01 DIAGNOSIS — J449 Chronic obstructive pulmonary disease, unspecified: Secondary | ICD-10-CM | POA: Diagnosis not present

## 2022-09-01 DIAGNOSIS — C786 Secondary malignant neoplasm of retroperitoneum and peritoneum: Secondary | ICD-10-CM | POA: Diagnosis not present

## 2022-09-01 DIAGNOSIS — E785 Hyperlipidemia, unspecified: Secondary | ICD-10-CM | POA: Diagnosis not present

## 2022-09-01 DIAGNOSIS — N400 Enlarged prostate without lower urinary tract symptoms: Secondary | ICD-10-CM | POA: Diagnosis not present

## 2022-09-01 DIAGNOSIS — C7880 Secondary malignant neoplasm of unspecified digestive organ: Secondary | ICD-10-CM | POA: Diagnosis not present

## 2022-09-01 DIAGNOSIS — E039 Hypothyroidism, unspecified: Secondary | ICD-10-CM | POA: Diagnosis not present

## 2022-09-01 DIAGNOSIS — R1084 Generalized abdominal pain: Secondary | ICD-10-CM | POA: Diagnosis not present

## 2022-09-01 DIAGNOSIS — C7951 Secondary malignant neoplasm of bone: Secondary | ICD-10-CM | POA: Diagnosis not present

## 2022-09-01 DIAGNOSIS — F32A Depression, unspecified: Secondary | ICD-10-CM | POA: Diagnosis not present

## 2022-09-01 DIAGNOSIS — J302 Other seasonal allergic rhinitis: Secondary | ICD-10-CM | POA: Diagnosis not present

## 2022-09-01 DIAGNOSIS — R0600 Dyspnea, unspecified: Secondary | ICD-10-CM | POA: Diagnosis not present

## 2022-09-06 DIAGNOSIS — C7A8 Other malignant neuroendocrine tumors: Secondary | ICD-10-CM | POA: Diagnosis not present

## 2022-09-06 DIAGNOSIS — R0600 Dyspnea, unspecified: Secondary | ICD-10-CM | POA: Diagnosis not present

## 2022-09-06 DIAGNOSIS — C786 Secondary malignant neoplasm of retroperitoneum and peritoneum: Secondary | ICD-10-CM | POA: Diagnosis not present

## 2022-09-06 DIAGNOSIS — C787 Secondary malignant neoplasm of liver and intrahepatic bile duct: Secondary | ICD-10-CM | POA: Diagnosis not present

## 2022-09-06 DIAGNOSIS — C7951 Secondary malignant neoplasm of bone: Secondary | ICD-10-CM | POA: Diagnosis not present

## 2022-09-06 DIAGNOSIS — C7880 Secondary malignant neoplasm of unspecified digestive organ: Secondary | ICD-10-CM | POA: Diagnosis not present

## 2022-09-07 DIAGNOSIS — R0600 Dyspnea, unspecified: Secondary | ICD-10-CM | POA: Diagnosis not present

## 2022-09-07 DIAGNOSIS — C7A8 Other malignant neuroendocrine tumors: Secondary | ICD-10-CM | POA: Diagnosis not present

## 2022-09-07 DIAGNOSIS — C7880 Secondary malignant neoplasm of unspecified digestive organ: Secondary | ICD-10-CM | POA: Diagnosis not present

## 2022-09-07 DIAGNOSIS — C786 Secondary malignant neoplasm of retroperitoneum and peritoneum: Secondary | ICD-10-CM | POA: Diagnosis not present

## 2022-09-07 DIAGNOSIS — C787 Secondary malignant neoplasm of liver and intrahepatic bile duct: Secondary | ICD-10-CM | POA: Diagnosis not present

## 2022-09-07 DIAGNOSIS — C7951 Secondary malignant neoplasm of bone: Secondary | ICD-10-CM | POA: Diagnosis not present

## 2022-09-13 DIAGNOSIS — R0600 Dyspnea, unspecified: Secondary | ICD-10-CM | POA: Diagnosis not present

## 2022-09-13 DIAGNOSIS — C786 Secondary malignant neoplasm of retroperitoneum and peritoneum: Secondary | ICD-10-CM | POA: Diagnosis not present

## 2022-09-13 DIAGNOSIS — C7A8 Other malignant neuroendocrine tumors: Secondary | ICD-10-CM | POA: Diagnosis not present

## 2022-09-13 DIAGNOSIS — C7880 Secondary malignant neoplasm of unspecified digestive organ: Secondary | ICD-10-CM | POA: Diagnosis not present

## 2022-09-13 DIAGNOSIS — C787 Secondary malignant neoplasm of liver and intrahepatic bile duct: Secondary | ICD-10-CM | POA: Diagnosis not present

## 2022-09-13 DIAGNOSIS — C7951 Secondary malignant neoplasm of bone: Secondary | ICD-10-CM | POA: Diagnosis not present

## 2022-09-20 DIAGNOSIS — C7880 Secondary malignant neoplasm of unspecified digestive organ: Secondary | ICD-10-CM | POA: Diagnosis not present

## 2022-09-20 DIAGNOSIS — R0600 Dyspnea, unspecified: Secondary | ICD-10-CM | POA: Diagnosis not present

## 2022-09-20 DIAGNOSIS — C7A8 Other malignant neuroendocrine tumors: Secondary | ICD-10-CM | POA: Diagnosis not present

## 2022-09-20 DIAGNOSIS — C786 Secondary malignant neoplasm of retroperitoneum and peritoneum: Secondary | ICD-10-CM | POA: Diagnosis not present

## 2022-09-20 DIAGNOSIS — C7951 Secondary malignant neoplasm of bone: Secondary | ICD-10-CM | POA: Diagnosis not present

## 2022-09-20 DIAGNOSIS — C787 Secondary malignant neoplasm of liver and intrahepatic bile duct: Secondary | ICD-10-CM | POA: Diagnosis not present

## 2022-09-27 DIAGNOSIS — C7A8 Other malignant neuroendocrine tumors: Secondary | ICD-10-CM | POA: Diagnosis not present

## 2022-09-27 DIAGNOSIS — C787 Secondary malignant neoplasm of liver and intrahepatic bile duct: Secondary | ICD-10-CM | POA: Diagnosis not present

## 2022-09-27 DIAGNOSIS — C7951 Secondary malignant neoplasm of bone: Secondary | ICD-10-CM | POA: Diagnosis not present

## 2022-09-27 DIAGNOSIS — R0600 Dyspnea, unspecified: Secondary | ICD-10-CM | POA: Diagnosis not present

## 2022-09-27 DIAGNOSIS — C786 Secondary malignant neoplasm of retroperitoneum and peritoneum: Secondary | ICD-10-CM | POA: Diagnosis not present

## 2022-09-27 DIAGNOSIS — C7880 Secondary malignant neoplasm of unspecified digestive organ: Secondary | ICD-10-CM | POA: Diagnosis not present

## 2022-10-02 DIAGNOSIS — C7880 Secondary malignant neoplasm of unspecified digestive organ: Secondary | ICD-10-CM | POA: Diagnosis not present

## 2022-10-02 DIAGNOSIS — Z9981 Dependence on supplemental oxygen: Secondary | ICD-10-CM | POA: Diagnosis not present

## 2022-10-02 DIAGNOSIS — C787 Secondary malignant neoplasm of liver and intrahepatic bile duct: Secondary | ICD-10-CM | POA: Diagnosis not present

## 2022-10-02 DIAGNOSIS — C7A8 Other malignant neuroendocrine tumors: Secondary | ICD-10-CM | POA: Diagnosis not present

## 2022-10-02 DIAGNOSIS — R1084 Generalized abdominal pain: Secondary | ICD-10-CM | POA: Diagnosis not present

## 2022-10-02 DIAGNOSIS — C786 Secondary malignant neoplasm of retroperitoneum and peritoneum: Secondary | ICD-10-CM | POA: Diagnosis not present

## 2022-10-02 DIAGNOSIS — J449 Chronic obstructive pulmonary disease, unspecified: Secondary | ICD-10-CM | POA: Diagnosis not present

## 2022-10-02 DIAGNOSIS — N400 Enlarged prostate without lower urinary tract symptoms: Secondary | ICD-10-CM | POA: Diagnosis not present

## 2022-10-02 DIAGNOSIS — E039 Hypothyroidism, unspecified: Secondary | ICD-10-CM | POA: Diagnosis not present

## 2022-10-02 DIAGNOSIS — J302 Other seasonal allergic rhinitis: Secondary | ICD-10-CM | POA: Diagnosis not present

## 2022-10-02 DIAGNOSIS — E785 Hyperlipidemia, unspecified: Secondary | ICD-10-CM | POA: Diagnosis not present

## 2022-10-02 DIAGNOSIS — C7951 Secondary malignant neoplasm of bone: Secondary | ICD-10-CM | POA: Diagnosis not present

## 2022-10-02 DIAGNOSIS — Z87891 Personal history of nicotine dependence: Secondary | ICD-10-CM | POA: Diagnosis not present

## 2022-10-02 DIAGNOSIS — F32A Depression, unspecified: Secondary | ICD-10-CM | POA: Diagnosis not present

## 2022-10-02 DIAGNOSIS — R0600 Dyspnea, unspecified: Secondary | ICD-10-CM | POA: Diagnosis not present

## 2022-10-04 DIAGNOSIS — C7A8 Other malignant neuroendocrine tumors: Secondary | ICD-10-CM | POA: Diagnosis not present

## 2022-10-04 DIAGNOSIS — R0600 Dyspnea, unspecified: Secondary | ICD-10-CM | POA: Diagnosis not present

## 2022-10-04 DIAGNOSIS — C7951 Secondary malignant neoplasm of bone: Secondary | ICD-10-CM | POA: Diagnosis not present

## 2022-10-04 DIAGNOSIS — C787 Secondary malignant neoplasm of liver and intrahepatic bile duct: Secondary | ICD-10-CM | POA: Diagnosis not present

## 2022-10-04 DIAGNOSIS — C7880 Secondary malignant neoplasm of unspecified digestive organ: Secondary | ICD-10-CM | POA: Diagnosis not present

## 2022-10-04 DIAGNOSIS — C786 Secondary malignant neoplasm of retroperitoneum and peritoneum: Secondary | ICD-10-CM | POA: Diagnosis not present

## 2022-10-11 DIAGNOSIS — C7951 Secondary malignant neoplasm of bone: Secondary | ICD-10-CM | POA: Diagnosis not present

## 2022-10-11 DIAGNOSIS — C787 Secondary malignant neoplasm of liver and intrahepatic bile duct: Secondary | ICD-10-CM | POA: Diagnosis not present

## 2022-10-11 DIAGNOSIS — C786 Secondary malignant neoplasm of retroperitoneum and peritoneum: Secondary | ICD-10-CM | POA: Diagnosis not present

## 2022-10-11 DIAGNOSIS — C7A8 Other malignant neuroendocrine tumors: Secondary | ICD-10-CM | POA: Diagnosis not present

## 2022-10-11 DIAGNOSIS — R0600 Dyspnea, unspecified: Secondary | ICD-10-CM | POA: Diagnosis not present

## 2022-10-11 DIAGNOSIS — C7880 Secondary malignant neoplasm of unspecified digestive organ: Secondary | ICD-10-CM | POA: Diagnosis not present

## 2022-10-18 DIAGNOSIS — C7880 Secondary malignant neoplasm of unspecified digestive organ: Secondary | ICD-10-CM | POA: Diagnosis not present

## 2022-10-18 DIAGNOSIS — C7951 Secondary malignant neoplasm of bone: Secondary | ICD-10-CM | POA: Diagnosis not present

## 2022-10-18 DIAGNOSIS — C787 Secondary malignant neoplasm of liver and intrahepatic bile duct: Secondary | ICD-10-CM | POA: Diagnosis not present

## 2022-10-18 DIAGNOSIS — C786 Secondary malignant neoplasm of retroperitoneum and peritoneum: Secondary | ICD-10-CM | POA: Diagnosis not present

## 2022-10-18 DIAGNOSIS — C7A8 Other malignant neuroendocrine tumors: Secondary | ICD-10-CM | POA: Diagnosis not present

## 2022-10-18 DIAGNOSIS — R0600 Dyspnea, unspecified: Secondary | ICD-10-CM | POA: Diagnosis not present

## 2022-10-25 DIAGNOSIS — R0600 Dyspnea, unspecified: Secondary | ICD-10-CM | POA: Diagnosis not present

## 2022-10-25 DIAGNOSIS — C786 Secondary malignant neoplasm of retroperitoneum and peritoneum: Secondary | ICD-10-CM | POA: Diagnosis not present

## 2022-10-25 DIAGNOSIS — C7951 Secondary malignant neoplasm of bone: Secondary | ICD-10-CM | POA: Diagnosis not present

## 2022-10-25 DIAGNOSIS — C7880 Secondary malignant neoplasm of unspecified digestive organ: Secondary | ICD-10-CM | POA: Diagnosis not present

## 2022-10-25 DIAGNOSIS — C7A8 Other malignant neuroendocrine tumors: Secondary | ICD-10-CM | POA: Diagnosis not present

## 2022-10-25 DIAGNOSIS — C787 Secondary malignant neoplasm of liver and intrahepatic bile duct: Secondary | ICD-10-CM | POA: Diagnosis not present

## 2022-11-01 DIAGNOSIS — E039 Hypothyroidism, unspecified: Secondary | ICD-10-CM | POA: Diagnosis not present

## 2022-11-01 DIAGNOSIS — C7951 Secondary malignant neoplasm of bone: Secondary | ICD-10-CM | POA: Diagnosis not present

## 2022-11-01 DIAGNOSIS — R1084 Generalized abdominal pain: Secondary | ICD-10-CM | POA: Diagnosis not present

## 2022-11-01 DIAGNOSIS — N400 Enlarged prostate without lower urinary tract symptoms: Secondary | ICD-10-CM | POA: Diagnosis not present

## 2022-11-01 DIAGNOSIS — J302 Other seasonal allergic rhinitis: Secondary | ICD-10-CM | POA: Diagnosis not present

## 2022-11-01 DIAGNOSIS — Z87891 Personal history of nicotine dependence: Secondary | ICD-10-CM | POA: Diagnosis not present

## 2022-11-01 DIAGNOSIS — R0600 Dyspnea, unspecified: Secondary | ICD-10-CM | POA: Diagnosis not present

## 2022-11-01 DIAGNOSIS — Z9981 Dependence on supplemental oxygen: Secondary | ICD-10-CM | POA: Diagnosis not present

## 2022-11-01 DIAGNOSIS — C7A8 Other malignant neuroendocrine tumors: Secondary | ICD-10-CM | POA: Diagnosis not present

## 2022-11-01 DIAGNOSIS — C787 Secondary malignant neoplasm of liver and intrahepatic bile duct: Secondary | ICD-10-CM | POA: Diagnosis not present

## 2022-11-01 DIAGNOSIS — J449 Chronic obstructive pulmonary disease, unspecified: Secondary | ICD-10-CM | POA: Diagnosis not present

## 2022-11-01 DIAGNOSIS — E785 Hyperlipidemia, unspecified: Secondary | ICD-10-CM | POA: Diagnosis not present

## 2022-11-01 DIAGNOSIS — F32A Depression, unspecified: Secondary | ICD-10-CM | POA: Diagnosis not present

## 2022-11-01 DIAGNOSIS — C7880 Secondary malignant neoplasm of unspecified digestive organ: Secondary | ICD-10-CM | POA: Diagnosis not present

## 2022-11-01 DIAGNOSIS — C786 Secondary malignant neoplasm of retroperitoneum and peritoneum: Secondary | ICD-10-CM | POA: Diagnosis not present

## 2022-11-03 DIAGNOSIS — C786 Secondary malignant neoplasm of retroperitoneum and peritoneum: Secondary | ICD-10-CM | POA: Diagnosis not present

## 2022-11-03 DIAGNOSIS — C7951 Secondary malignant neoplasm of bone: Secondary | ICD-10-CM | POA: Diagnosis not present

## 2022-11-03 DIAGNOSIS — C787 Secondary malignant neoplasm of liver and intrahepatic bile duct: Secondary | ICD-10-CM | POA: Diagnosis not present

## 2022-11-03 DIAGNOSIS — C7880 Secondary malignant neoplasm of unspecified digestive organ: Secondary | ICD-10-CM | POA: Diagnosis not present

## 2022-11-03 DIAGNOSIS — C7A8 Other malignant neuroendocrine tumors: Secondary | ICD-10-CM | POA: Diagnosis not present

## 2022-11-03 DIAGNOSIS — R0600 Dyspnea, unspecified: Secondary | ICD-10-CM | POA: Diagnosis not present

## 2022-11-08 DIAGNOSIS — C786 Secondary malignant neoplasm of retroperitoneum and peritoneum: Secondary | ICD-10-CM | POA: Diagnosis not present

## 2022-11-08 DIAGNOSIS — C787 Secondary malignant neoplasm of liver and intrahepatic bile duct: Secondary | ICD-10-CM | POA: Diagnosis not present

## 2022-11-08 DIAGNOSIS — C7951 Secondary malignant neoplasm of bone: Secondary | ICD-10-CM | POA: Diagnosis not present

## 2022-11-08 DIAGNOSIS — R0600 Dyspnea, unspecified: Secondary | ICD-10-CM | POA: Diagnosis not present

## 2022-11-08 DIAGNOSIS — C7A8 Other malignant neuroendocrine tumors: Secondary | ICD-10-CM | POA: Diagnosis not present

## 2022-11-08 DIAGNOSIS — C7880 Secondary malignant neoplasm of unspecified digestive organ: Secondary | ICD-10-CM | POA: Diagnosis not present

## 2022-11-15 DIAGNOSIS — C786 Secondary malignant neoplasm of retroperitoneum and peritoneum: Secondary | ICD-10-CM | POA: Diagnosis not present

## 2022-11-15 DIAGNOSIS — C7A8 Other malignant neuroendocrine tumors: Secondary | ICD-10-CM | POA: Diagnosis not present

## 2022-11-15 DIAGNOSIS — C7951 Secondary malignant neoplasm of bone: Secondary | ICD-10-CM | POA: Diagnosis not present

## 2022-11-15 DIAGNOSIS — R0600 Dyspnea, unspecified: Secondary | ICD-10-CM | POA: Diagnosis not present

## 2022-11-15 DIAGNOSIS — C787 Secondary malignant neoplasm of liver and intrahepatic bile duct: Secondary | ICD-10-CM | POA: Diagnosis not present

## 2022-11-15 DIAGNOSIS — C7880 Secondary malignant neoplasm of unspecified digestive organ: Secondary | ICD-10-CM | POA: Diagnosis not present

## 2022-11-22 DIAGNOSIS — C7951 Secondary malignant neoplasm of bone: Secondary | ICD-10-CM | POA: Diagnosis not present

## 2022-11-22 DIAGNOSIS — C7880 Secondary malignant neoplasm of unspecified digestive organ: Secondary | ICD-10-CM | POA: Diagnosis not present

## 2022-11-22 DIAGNOSIS — C786 Secondary malignant neoplasm of retroperitoneum and peritoneum: Secondary | ICD-10-CM | POA: Diagnosis not present

## 2022-11-22 DIAGNOSIS — C7A8 Other malignant neuroendocrine tumors: Secondary | ICD-10-CM | POA: Diagnosis not present

## 2022-11-22 DIAGNOSIS — C787 Secondary malignant neoplasm of liver and intrahepatic bile duct: Secondary | ICD-10-CM | POA: Diagnosis not present

## 2022-11-22 DIAGNOSIS — R0600 Dyspnea, unspecified: Secondary | ICD-10-CM | POA: Diagnosis not present

## 2022-11-29 DIAGNOSIS — C786 Secondary malignant neoplasm of retroperitoneum and peritoneum: Secondary | ICD-10-CM | POA: Diagnosis not present

## 2022-11-29 DIAGNOSIS — C787 Secondary malignant neoplasm of liver and intrahepatic bile duct: Secondary | ICD-10-CM | POA: Diagnosis not present

## 2022-11-29 DIAGNOSIS — C7A8 Other malignant neuroendocrine tumors: Secondary | ICD-10-CM | POA: Diagnosis not present

## 2022-11-29 DIAGNOSIS — R0600 Dyspnea, unspecified: Secondary | ICD-10-CM | POA: Diagnosis not present

## 2022-11-29 DIAGNOSIS — C7880 Secondary malignant neoplasm of unspecified digestive organ: Secondary | ICD-10-CM | POA: Diagnosis not present

## 2022-11-29 DIAGNOSIS — C7951 Secondary malignant neoplasm of bone: Secondary | ICD-10-CM | POA: Diagnosis not present

## 2022-12-02 DIAGNOSIS — C7A8 Other malignant neuroendocrine tumors: Secondary | ICD-10-CM | POA: Diagnosis not present

## 2022-12-02 DIAGNOSIS — Z87891 Personal history of nicotine dependence: Secondary | ICD-10-CM | POA: Diagnosis not present

## 2022-12-02 DIAGNOSIS — C7880 Secondary malignant neoplasm of unspecified digestive organ: Secondary | ICD-10-CM | POA: Diagnosis not present

## 2022-12-02 DIAGNOSIS — F32A Depression, unspecified: Secondary | ICD-10-CM | POA: Diagnosis not present

## 2022-12-02 DIAGNOSIS — C786 Secondary malignant neoplasm of retroperitoneum and peritoneum: Secondary | ICD-10-CM | POA: Diagnosis not present

## 2022-12-02 DIAGNOSIS — E039 Hypothyroidism, unspecified: Secondary | ICD-10-CM | POA: Diagnosis not present

## 2022-12-02 DIAGNOSIS — J449 Chronic obstructive pulmonary disease, unspecified: Secondary | ICD-10-CM | POA: Diagnosis not present

## 2022-12-02 DIAGNOSIS — R0600 Dyspnea, unspecified: Secondary | ICD-10-CM | POA: Diagnosis not present

## 2022-12-02 DIAGNOSIS — Z9981 Dependence on supplemental oxygen: Secondary | ICD-10-CM | POA: Diagnosis not present

## 2022-12-02 DIAGNOSIS — C787 Secondary malignant neoplasm of liver and intrahepatic bile duct: Secondary | ICD-10-CM | POA: Diagnosis not present

## 2022-12-02 DIAGNOSIS — J302 Other seasonal allergic rhinitis: Secondary | ICD-10-CM | POA: Diagnosis not present

## 2022-12-02 DIAGNOSIS — E785 Hyperlipidemia, unspecified: Secondary | ICD-10-CM | POA: Diagnosis not present

## 2022-12-02 DIAGNOSIS — R1084 Generalized abdominal pain: Secondary | ICD-10-CM | POA: Diagnosis not present

## 2022-12-02 DIAGNOSIS — N400 Enlarged prostate without lower urinary tract symptoms: Secondary | ICD-10-CM | POA: Diagnosis not present

## 2022-12-02 DIAGNOSIS — C7951 Secondary malignant neoplasm of bone: Secondary | ICD-10-CM | POA: Diagnosis not present

## 2022-12-06 DIAGNOSIS — R0600 Dyspnea, unspecified: Secondary | ICD-10-CM | POA: Diagnosis not present

## 2022-12-06 DIAGNOSIS — C787 Secondary malignant neoplasm of liver and intrahepatic bile duct: Secondary | ICD-10-CM | POA: Diagnosis not present

## 2022-12-06 DIAGNOSIS — C786 Secondary malignant neoplasm of retroperitoneum and peritoneum: Secondary | ICD-10-CM | POA: Diagnosis not present

## 2022-12-06 DIAGNOSIS — C7880 Secondary malignant neoplasm of unspecified digestive organ: Secondary | ICD-10-CM | POA: Diagnosis not present

## 2022-12-06 DIAGNOSIS — C7951 Secondary malignant neoplasm of bone: Secondary | ICD-10-CM | POA: Diagnosis not present

## 2022-12-06 DIAGNOSIS — C7A8 Other malignant neuroendocrine tumors: Secondary | ICD-10-CM | POA: Diagnosis not present

## 2022-12-07 DIAGNOSIS — R0600 Dyspnea, unspecified: Secondary | ICD-10-CM | POA: Diagnosis not present

## 2022-12-07 DIAGNOSIS — C787 Secondary malignant neoplasm of liver and intrahepatic bile duct: Secondary | ICD-10-CM | POA: Diagnosis not present

## 2022-12-07 DIAGNOSIS — C786 Secondary malignant neoplasm of retroperitoneum and peritoneum: Secondary | ICD-10-CM | POA: Diagnosis not present

## 2022-12-07 DIAGNOSIS — C7951 Secondary malignant neoplasm of bone: Secondary | ICD-10-CM | POA: Diagnosis not present

## 2022-12-07 DIAGNOSIS — C7880 Secondary malignant neoplasm of unspecified digestive organ: Secondary | ICD-10-CM | POA: Diagnosis not present

## 2022-12-07 DIAGNOSIS — C7A8 Other malignant neuroendocrine tumors: Secondary | ICD-10-CM | POA: Diagnosis not present

## 2022-12-08 DIAGNOSIS — C7951 Secondary malignant neoplasm of bone: Secondary | ICD-10-CM | POA: Diagnosis not present

## 2022-12-08 DIAGNOSIS — C786 Secondary malignant neoplasm of retroperitoneum and peritoneum: Secondary | ICD-10-CM | POA: Diagnosis not present

## 2022-12-08 DIAGNOSIS — C787 Secondary malignant neoplasm of liver and intrahepatic bile duct: Secondary | ICD-10-CM | POA: Diagnosis not present

## 2022-12-08 DIAGNOSIS — C7A8 Other malignant neuroendocrine tumors: Secondary | ICD-10-CM | POA: Diagnosis not present

## 2022-12-08 DIAGNOSIS — C7880 Secondary malignant neoplasm of unspecified digestive organ: Secondary | ICD-10-CM | POA: Diagnosis not present

## 2022-12-08 DIAGNOSIS — R0600 Dyspnea, unspecified: Secondary | ICD-10-CM | POA: Diagnosis not present

## 2022-12-13 DIAGNOSIS — C786 Secondary malignant neoplasm of retroperitoneum and peritoneum: Secondary | ICD-10-CM | POA: Diagnosis not present

## 2022-12-13 DIAGNOSIS — C7951 Secondary malignant neoplasm of bone: Secondary | ICD-10-CM | POA: Diagnosis not present

## 2022-12-13 DIAGNOSIS — C7A8 Other malignant neuroendocrine tumors: Secondary | ICD-10-CM | POA: Diagnosis not present

## 2022-12-13 DIAGNOSIS — R0600 Dyspnea, unspecified: Secondary | ICD-10-CM | POA: Diagnosis not present

## 2022-12-13 DIAGNOSIS — C7880 Secondary malignant neoplasm of unspecified digestive organ: Secondary | ICD-10-CM | POA: Diagnosis not present

## 2022-12-13 DIAGNOSIS — C787 Secondary malignant neoplasm of liver and intrahepatic bile duct: Secondary | ICD-10-CM | POA: Diagnosis not present

## 2022-12-20 DIAGNOSIS — C786 Secondary malignant neoplasm of retroperitoneum and peritoneum: Secondary | ICD-10-CM | POA: Diagnosis not present

## 2022-12-20 DIAGNOSIS — C7951 Secondary malignant neoplasm of bone: Secondary | ICD-10-CM | POA: Diagnosis not present

## 2022-12-20 DIAGNOSIS — C7880 Secondary malignant neoplasm of unspecified digestive organ: Secondary | ICD-10-CM | POA: Diagnosis not present

## 2022-12-20 DIAGNOSIS — R0600 Dyspnea, unspecified: Secondary | ICD-10-CM | POA: Diagnosis not present

## 2022-12-20 DIAGNOSIS — C7A8 Other malignant neuroendocrine tumors: Secondary | ICD-10-CM | POA: Diagnosis not present

## 2022-12-20 DIAGNOSIS — C787 Secondary malignant neoplasm of liver and intrahepatic bile duct: Secondary | ICD-10-CM | POA: Diagnosis not present

## 2022-12-27 DIAGNOSIS — R0600 Dyspnea, unspecified: Secondary | ICD-10-CM | POA: Diagnosis not present

## 2022-12-27 DIAGNOSIS — C7951 Secondary malignant neoplasm of bone: Secondary | ICD-10-CM | POA: Diagnosis not present

## 2022-12-27 DIAGNOSIS — C7A8 Other malignant neuroendocrine tumors: Secondary | ICD-10-CM | POA: Diagnosis not present

## 2022-12-27 DIAGNOSIS — C786 Secondary malignant neoplasm of retroperitoneum and peritoneum: Secondary | ICD-10-CM | POA: Diagnosis not present

## 2022-12-27 DIAGNOSIS — C787 Secondary malignant neoplasm of liver and intrahepatic bile duct: Secondary | ICD-10-CM | POA: Diagnosis not present

## 2022-12-27 DIAGNOSIS — C7880 Secondary malignant neoplasm of unspecified digestive organ: Secondary | ICD-10-CM | POA: Diagnosis not present

## 2023-01-01 DIAGNOSIS — R0600 Dyspnea, unspecified: Secondary | ICD-10-CM | POA: Diagnosis not present

## 2023-01-01 DIAGNOSIS — C787 Secondary malignant neoplasm of liver and intrahepatic bile duct: Secondary | ICD-10-CM | POA: Diagnosis not present

## 2023-01-01 DIAGNOSIS — R1084 Generalized abdominal pain: Secondary | ICD-10-CM | POA: Diagnosis not present

## 2023-01-01 DIAGNOSIS — C7951 Secondary malignant neoplasm of bone: Secondary | ICD-10-CM | POA: Diagnosis not present

## 2023-01-01 DIAGNOSIS — Z87891 Personal history of nicotine dependence: Secondary | ICD-10-CM | POA: Diagnosis not present

## 2023-01-01 DIAGNOSIS — C786 Secondary malignant neoplasm of retroperitoneum and peritoneum: Secondary | ICD-10-CM | POA: Diagnosis not present

## 2023-01-01 DIAGNOSIS — J449 Chronic obstructive pulmonary disease, unspecified: Secondary | ICD-10-CM | POA: Diagnosis not present

## 2023-01-01 DIAGNOSIS — E785 Hyperlipidemia, unspecified: Secondary | ICD-10-CM | POA: Diagnosis not present

## 2023-01-01 DIAGNOSIS — C7A8 Other malignant neuroendocrine tumors: Secondary | ICD-10-CM | POA: Diagnosis not present

## 2023-01-01 DIAGNOSIS — C7880 Secondary malignant neoplasm of unspecified digestive organ: Secondary | ICD-10-CM | POA: Diagnosis not present

## 2023-01-01 DIAGNOSIS — E039 Hypothyroidism, unspecified: Secondary | ICD-10-CM | POA: Diagnosis not present

## 2023-01-01 DIAGNOSIS — Z9981 Dependence on supplemental oxygen: Secondary | ICD-10-CM | POA: Diagnosis not present

## 2023-01-01 DIAGNOSIS — J302 Other seasonal allergic rhinitis: Secondary | ICD-10-CM | POA: Diagnosis not present

## 2023-01-01 DIAGNOSIS — F32A Depression, unspecified: Secondary | ICD-10-CM | POA: Diagnosis not present

## 2023-01-01 DIAGNOSIS — N400 Enlarged prostate without lower urinary tract symptoms: Secondary | ICD-10-CM | POA: Diagnosis not present

## 2023-01-03 DIAGNOSIS — C7951 Secondary malignant neoplasm of bone: Secondary | ICD-10-CM | POA: Diagnosis not present

## 2023-01-03 DIAGNOSIS — R0600 Dyspnea, unspecified: Secondary | ICD-10-CM | POA: Diagnosis not present

## 2023-01-03 DIAGNOSIS — C7A8 Other malignant neuroendocrine tumors: Secondary | ICD-10-CM | POA: Diagnosis not present

## 2023-01-03 DIAGNOSIS — C786 Secondary malignant neoplasm of retroperitoneum and peritoneum: Secondary | ICD-10-CM | POA: Diagnosis not present

## 2023-01-03 DIAGNOSIS — C787 Secondary malignant neoplasm of liver and intrahepatic bile duct: Secondary | ICD-10-CM | POA: Diagnosis not present

## 2023-01-03 DIAGNOSIS — C7880 Secondary malignant neoplasm of unspecified digestive organ: Secondary | ICD-10-CM | POA: Diagnosis not present

## 2023-01-05 DIAGNOSIS — C787 Secondary malignant neoplasm of liver and intrahepatic bile duct: Secondary | ICD-10-CM | POA: Diagnosis not present

## 2023-01-05 DIAGNOSIS — C7880 Secondary malignant neoplasm of unspecified digestive organ: Secondary | ICD-10-CM | POA: Diagnosis not present

## 2023-01-05 DIAGNOSIS — C786 Secondary malignant neoplasm of retroperitoneum and peritoneum: Secondary | ICD-10-CM | POA: Diagnosis not present

## 2023-01-05 DIAGNOSIS — R0600 Dyspnea, unspecified: Secondary | ICD-10-CM | POA: Diagnosis not present

## 2023-01-05 DIAGNOSIS — C7951 Secondary malignant neoplasm of bone: Secondary | ICD-10-CM | POA: Diagnosis not present

## 2023-01-05 DIAGNOSIS — C7A8 Other malignant neuroendocrine tumors: Secondary | ICD-10-CM | POA: Diagnosis not present

## 2023-01-08 DIAGNOSIS — C7951 Secondary malignant neoplasm of bone: Secondary | ICD-10-CM | POA: Diagnosis not present

## 2023-01-08 DIAGNOSIS — C7A8 Other malignant neuroendocrine tumors: Secondary | ICD-10-CM | POA: Diagnosis not present

## 2023-01-08 DIAGNOSIS — R0600 Dyspnea, unspecified: Secondary | ICD-10-CM | POA: Diagnosis not present

## 2023-01-08 DIAGNOSIS — C787 Secondary malignant neoplasm of liver and intrahepatic bile duct: Secondary | ICD-10-CM | POA: Diagnosis not present

## 2023-01-08 DIAGNOSIS — C786 Secondary malignant neoplasm of retroperitoneum and peritoneum: Secondary | ICD-10-CM | POA: Diagnosis not present

## 2023-01-08 DIAGNOSIS — C7880 Secondary malignant neoplasm of unspecified digestive organ: Secondary | ICD-10-CM | POA: Diagnosis not present

## 2023-01-10 DIAGNOSIS — C7A8 Other malignant neuroendocrine tumors: Secondary | ICD-10-CM | POA: Diagnosis not present

## 2023-01-10 DIAGNOSIS — C787 Secondary malignant neoplasm of liver and intrahepatic bile duct: Secondary | ICD-10-CM | POA: Diagnosis not present

## 2023-01-10 DIAGNOSIS — R0600 Dyspnea, unspecified: Secondary | ICD-10-CM | POA: Diagnosis not present

## 2023-01-10 DIAGNOSIS — C786 Secondary malignant neoplasm of retroperitoneum and peritoneum: Secondary | ICD-10-CM | POA: Diagnosis not present

## 2023-01-10 DIAGNOSIS — C7880 Secondary malignant neoplasm of unspecified digestive organ: Secondary | ICD-10-CM | POA: Diagnosis not present

## 2023-01-10 DIAGNOSIS — C7951 Secondary malignant neoplasm of bone: Secondary | ICD-10-CM | POA: Diagnosis not present

## 2023-01-17 DIAGNOSIS — C786 Secondary malignant neoplasm of retroperitoneum and peritoneum: Secondary | ICD-10-CM | POA: Diagnosis not present

## 2023-01-17 DIAGNOSIS — C7A8 Other malignant neuroendocrine tumors: Secondary | ICD-10-CM | POA: Diagnosis not present

## 2023-01-17 DIAGNOSIS — R0600 Dyspnea, unspecified: Secondary | ICD-10-CM | POA: Diagnosis not present

## 2023-01-17 DIAGNOSIS — C7880 Secondary malignant neoplasm of unspecified digestive organ: Secondary | ICD-10-CM | POA: Diagnosis not present

## 2023-01-17 DIAGNOSIS — C7951 Secondary malignant neoplasm of bone: Secondary | ICD-10-CM | POA: Diagnosis not present

## 2023-01-17 DIAGNOSIS — C787 Secondary malignant neoplasm of liver and intrahepatic bile duct: Secondary | ICD-10-CM | POA: Diagnosis not present

## 2023-01-24 DIAGNOSIS — C787 Secondary malignant neoplasm of liver and intrahepatic bile duct: Secondary | ICD-10-CM | POA: Diagnosis not present

## 2023-01-24 DIAGNOSIS — C7880 Secondary malignant neoplasm of unspecified digestive organ: Secondary | ICD-10-CM | POA: Diagnosis not present

## 2023-01-24 DIAGNOSIS — R0600 Dyspnea, unspecified: Secondary | ICD-10-CM | POA: Diagnosis not present

## 2023-01-24 DIAGNOSIS — C786 Secondary malignant neoplasm of retroperitoneum and peritoneum: Secondary | ICD-10-CM | POA: Diagnosis not present

## 2023-01-24 DIAGNOSIS — C7951 Secondary malignant neoplasm of bone: Secondary | ICD-10-CM | POA: Diagnosis not present

## 2023-01-24 DIAGNOSIS — C7A8 Other malignant neuroendocrine tumors: Secondary | ICD-10-CM | POA: Diagnosis not present

## 2023-01-31 DIAGNOSIS — C7A8 Other malignant neuroendocrine tumors: Secondary | ICD-10-CM | POA: Diagnosis not present

## 2023-01-31 DIAGNOSIS — R0600 Dyspnea, unspecified: Secondary | ICD-10-CM | POA: Diagnosis not present

## 2023-01-31 DIAGNOSIS — C787 Secondary malignant neoplasm of liver and intrahepatic bile duct: Secondary | ICD-10-CM | POA: Diagnosis not present

## 2023-01-31 DIAGNOSIS — C786 Secondary malignant neoplasm of retroperitoneum and peritoneum: Secondary | ICD-10-CM | POA: Diagnosis not present

## 2023-01-31 DIAGNOSIS — C7951 Secondary malignant neoplasm of bone: Secondary | ICD-10-CM | POA: Diagnosis not present

## 2023-01-31 DIAGNOSIS — C7880 Secondary malignant neoplasm of unspecified digestive organ: Secondary | ICD-10-CM | POA: Diagnosis not present

## 2023-02-01 DIAGNOSIS — C786 Secondary malignant neoplasm of retroperitoneum and peritoneum: Secondary | ICD-10-CM | POA: Diagnosis not present

## 2023-02-01 DIAGNOSIS — E039 Hypothyroidism, unspecified: Secondary | ICD-10-CM | POA: Diagnosis not present

## 2023-02-01 DIAGNOSIS — J449 Chronic obstructive pulmonary disease, unspecified: Secondary | ICD-10-CM | POA: Diagnosis not present

## 2023-02-01 DIAGNOSIS — Z87891 Personal history of nicotine dependence: Secondary | ICD-10-CM | POA: Diagnosis not present

## 2023-02-01 DIAGNOSIS — N400 Enlarged prostate without lower urinary tract symptoms: Secondary | ICD-10-CM | POA: Diagnosis not present

## 2023-02-01 DIAGNOSIS — J302 Other seasonal allergic rhinitis: Secondary | ICD-10-CM | POA: Diagnosis not present

## 2023-02-01 DIAGNOSIS — C787 Secondary malignant neoplasm of liver and intrahepatic bile duct: Secondary | ICD-10-CM | POA: Diagnosis not present

## 2023-02-01 DIAGNOSIS — R1084 Generalized abdominal pain: Secondary | ICD-10-CM | POA: Diagnosis not present

## 2023-02-01 DIAGNOSIS — C7951 Secondary malignant neoplasm of bone: Secondary | ICD-10-CM | POA: Diagnosis not present

## 2023-02-01 DIAGNOSIS — C7A8 Other malignant neuroendocrine tumors: Secondary | ICD-10-CM | POA: Diagnosis not present

## 2023-02-01 DIAGNOSIS — E785 Hyperlipidemia, unspecified: Secondary | ICD-10-CM | POA: Diagnosis not present

## 2023-02-01 DIAGNOSIS — Z9981 Dependence on supplemental oxygen: Secondary | ICD-10-CM | POA: Diagnosis not present

## 2023-02-01 DIAGNOSIS — F32A Depression, unspecified: Secondary | ICD-10-CM | POA: Diagnosis not present

## 2023-02-01 DIAGNOSIS — C7880 Secondary malignant neoplasm of unspecified digestive organ: Secondary | ICD-10-CM | POA: Diagnosis not present

## 2023-02-01 DIAGNOSIS — R0600 Dyspnea, unspecified: Secondary | ICD-10-CM | POA: Diagnosis not present

## 2023-02-07 DIAGNOSIS — C786 Secondary malignant neoplasm of retroperitoneum and peritoneum: Secondary | ICD-10-CM | POA: Diagnosis not present

## 2023-02-07 DIAGNOSIS — C7880 Secondary malignant neoplasm of unspecified digestive organ: Secondary | ICD-10-CM | POA: Diagnosis not present

## 2023-02-07 DIAGNOSIS — C787 Secondary malignant neoplasm of liver and intrahepatic bile duct: Secondary | ICD-10-CM | POA: Diagnosis not present

## 2023-02-07 DIAGNOSIS — C7951 Secondary malignant neoplasm of bone: Secondary | ICD-10-CM | POA: Diagnosis not present

## 2023-02-07 DIAGNOSIS — C7A8 Other malignant neuroendocrine tumors: Secondary | ICD-10-CM | POA: Diagnosis not present

## 2023-02-07 DIAGNOSIS — R0600 Dyspnea, unspecified: Secondary | ICD-10-CM | POA: Diagnosis not present

## 2023-02-14 DIAGNOSIS — R0600 Dyspnea, unspecified: Secondary | ICD-10-CM | POA: Diagnosis not present

## 2023-02-14 DIAGNOSIS — C787 Secondary malignant neoplasm of liver and intrahepatic bile duct: Secondary | ICD-10-CM | POA: Diagnosis not present

## 2023-02-14 DIAGNOSIS — C7951 Secondary malignant neoplasm of bone: Secondary | ICD-10-CM | POA: Diagnosis not present

## 2023-02-14 DIAGNOSIS — C786 Secondary malignant neoplasm of retroperitoneum and peritoneum: Secondary | ICD-10-CM | POA: Diagnosis not present

## 2023-02-14 DIAGNOSIS — C7880 Secondary malignant neoplasm of unspecified digestive organ: Secondary | ICD-10-CM | POA: Diagnosis not present

## 2023-02-14 DIAGNOSIS — C7A8 Other malignant neuroendocrine tumors: Secondary | ICD-10-CM | POA: Diagnosis not present

## 2023-02-21 DIAGNOSIS — C786 Secondary malignant neoplasm of retroperitoneum and peritoneum: Secondary | ICD-10-CM | POA: Diagnosis not present

## 2023-02-21 DIAGNOSIS — C787 Secondary malignant neoplasm of liver and intrahepatic bile duct: Secondary | ICD-10-CM | POA: Diagnosis not present

## 2023-02-21 DIAGNOSIS — C7951 Secondary malignant neoplasm of bone: Secondary | ICD-10-CM | POA: Diagnosis not present

## 2023-02-21 DIAGNOSIS — C7A8 Other malignant neuroendocrine tumors: Secondary | ICD-10-CM | POA: Diagnosis not present

## 2023-02-21 DIAGNOSIS — C7880 Secondary malignant neoplasm of unspecified digestive organ: Secondary | ICD-10-CM | POA: Diagnosis not present

## 2023-02-21 DIAGNOSIS — R0600 Dyspnea, unspecified: Secondary | ICD-10-CM | POA: Diagnosis not present

## 2023-02-28 DIAGNOSIS — C7951 Secondary malignant neoplasm of bone: Secondary | ICD-10-CM | POA: Diagnosis not present

## 2023-02-28 DIAGNOSIS — R0600 Dyspnea, unspecified: Secondary | ICD-10-CM | POA: Diagnosis not present

## 2023-02-28 DIAGNOSIS — C7A8 Other malignant neuroendocrine tumors: Secondary | ICD-10-CM | POA: Diagnosis not present

## 2023-02-28 DIAGNOSIS — C787 Secondary malignant neoplasm of liver and intrahepatic bile duct: Secondary | ICD-10-CM | POA: Diagnosis not present

## 2023-02-28 DIAGNOSIS — C786 Secondary malignant neoplasm of retroperitoneum and peritoneum: Secondary | ICD-10-CM | POA: Diagnosis not present

## 2023-02-28 DIAGNOSIS — C7880 Secondary malignant neoplasm of unspecified digestive organ: Secondary | ICD-10-CM | POA: Diagnosis not present

## 2023-03-04 DIAGNOSIS — E785 Hyperlipidemia, unspecified: Secondary | ICD-10-CM | POA: Diagnosis not present

## 2023-03-04 DIAGNOSIS — R1084 Generalized abdominal pain: Secondary | ICD-10-CM | POA: Diagnosis not present

## 2023-03-04 DIAGNOSIS — C787 Secondary malignant neoplasm of liver and intrahepatic bile duct: Secondary | ICD-10-CM | POA: Diagnosis not present

## 2023-03-04 DIAGNOSIS — C786 Secondary malignant neoplasm of retroperitoneum and peritoneum: Secondary | ICD-10-CM | POA: Diagnosis not present

## 2023-03-04 DIAGNOSIS — Z87891 Personal history of nicotine dependence: Secondary | ICD-10-CM | POA: Diagnosis not present

## 2023-03-04 DIAGNOSIS — R0600 Dyspnea, unspecified: Secondary | ICD-10-CM | POA: Diagnosis not present

## 2023-03-04 DIAGNOSIS — F32A Depression, unspecified: Secondary | ICD-10-CM | POA: Diagnosis not present

## 2023-03-04 DIAGNOSIS — C7A8 Other malignant neuroendocrine tumors: Secondary | ICD-10-CM | POA: Diagnosis not present

## 2023-03-04 DIAGNOSIS — J449 Chronic obstructive pulmonary disease, unspecified: Secondary | ICD-10-CM | POA: Diagnosis not present

## 2023-03-04 DIAGNOSIS — C7951 Secondary malignant neoplasm of bone: Secondary | ICD-10-CM | POA: Diagnosis not present

## 2023-03-04 DIAGNOSIS — C7880 Secondary malignant neoplasm of unspecified digestive organ: Secondary | ICD-10-CM | POA: Diagnosis not present

## 2023-03-04 DIAGNOSIS — N400 Enlarged prostate without lower urinary tract symptoms: Secondary | ICD-10-CM | POA: Diagnosis not present

## 2023-03-05 DIAGNOSIS — C786 Secondary malignant neoplasm of retroperitoneum and peritoneum: Secondary | ICD-10-CM | POA: Diagnosis not present

## 2023-03-05 DIAGNOSIS — C7A8 Other malignant neuroendocrine tumors: Secondary | ICD-10-CM | POA: Diagnosis not present

## 2023-03-05 DIAGNOSIS — R0600 Dyspnea, unspecified: Secondary | ICD-10-CM | POA: Diagnosis not present

## 2023-03-05 DIAGNOSIS — C787 Secondary malignant neoplasm of liver and intrahepatic bile duct: Secondary | ICD-10-CM | POA: Diagnosis not present

## 2023-03-05 DIAGNOSIS — C7951 Secondary malignant neoplasm of bone: Secondary | ICD-10-CM | POA: Diagnosis not present

## 2023-03-05 DIAGNOSIS — C7880 Secondary malignant neoplasm of unspecified digestive organ: Secondary | ICD-10-CM | POA: Diagnosis not present

## 2023-03-06 DIAGNOSIS — R0600 Dyspnea, unspecified: Secondary | ICD-10-CM | POA: Diagnosis not present

## 2023-03-06 DIAGNOSIS — C786 Secondary malignant neoplasm of retroperitoneum and peritoneum: Secondary | ICD-10-CM | POA: Diagnosis not present

## 2023-03-06 DIAGNOSIS — C787 Secondary malignant neoplasm of liver and intrahepatic bile duct: Secondary | ICD-10-CM | POA: Diagnosis not present

## 2023-03-06 DIAGNOSIS — C7A8 Other malignant neuroendocrine tumors: Secondary | ICD-10-CM | POA: Diagnosis not present

## 2023-03-06 DIAGNOSIS — C7880 Secondary malignant neoplasm of unspecified digestive organ: Secondary | ICD-10-CM | POA: Diagnosis not present

## 2023-03-06 DIAGNOSIS — C7951 Secondary malignant neoplasm of bone: Secondary | ICD-10-CM | POA: Diagnosis not present

## 2023-03-07 DIAGNOSIS — C786 Secondary malignant neoplasm of retroperitoneum and peritoneum: Secondary | ICD-10-CM | POA: Diagnosis not present

## 2023-03-07 DIAGNOSIS — C787 Secondary malignant neoplasm of liver and intrahepatic bile duct: Secondary | ICD-10-CM | POA: Diagnosis not present

## 2023-03-07 DIAGNOSIS — C7A8 Other malignant neuroendocrine tumors: Secondary | ICD-10-CM | POA: Diagnosis not present

## 2023-03-07 DIAGNOSIS — C7880 Secondary malignant neoplasm of unspecified digestive organ: Secondary | ICD-10-CM | POA: Diagnosis not present

## 2023-03-07 DIAGNOSIS — R0600 Dyspnea, unspecified: Secondary | ICD-10-CM | POA: Diagnosis not present

## 2023-03-07 DIAGNOSIS — C7951 Secondary malignant neoplasm of bone: Secondary | ICD-10-CM | POA: Diagnosis not present

## 2023-03-08 DIAGNOSIS — C7951 Secondary malignant neoplasm of bone: Secondary | ICD-10-CM | POA: Diagnosis not present

## 2023-03-08 DIAGNOSIS — C7A8 Other malignant neuroendocrine tumors: Secondary | ICD-10-CM | POA: Diagnosis not present

## 2023-03-08 DIAGNOSIS — C7880 Secondary malignant neoplasm of unspecified digestive organ: Secondary | ICD-10-CM | POA: Diagnosis not present

## 2023-03-08 DIAGNOSIS — C786 Secondary malignant neoplasm of retroperitoneum and peritoneum: Secondary | ICD-10-CM | POA: Diagnosis not present

## 2023-03-08 DIAGNOSIS — R0600 Dyspnea, unspecified: Secondary | ICD-10-CM | POA: Diagnosis not present

## 2023-03-08 DIAGNOSIS — C787 Secondary malignant neoplasm of liver and intrahepatic bile duct: Secondary | ICD-10-CM | POA: Diagnosis not present

## 2023-03-09 DIAGNOSIS — R0600 Dyspnea, unspecified: Secondary | ICD-10-CM | POA: Diagnosis not present

## 2023-03-09 DIAGNOSIS — C7A8 Other malignant neuroendocrine tumors: Secondary | ICD-10-CM | POA: Diagnosis not present

## 2023-03-09 DIAGNOSIS — C787 Secondary malignant neoplasm of liver and intrahepatic bile duct: Secondary | ICD-10-CM | POA: Diagnosis not present

## 2023-03-09 DIAGNOSIS — C7880 Secondary malignant neoplasm of unspecified digestive organ: Secondary | ICD-10-CM | POA: Diagnosis not present

## 2023-03-09 DIAGNOSIS — C7951 Secondary malignant neoplasm of bone: Secondary | ICD-10-CM | POA: Diagnosis not present

## 2023-03-09 DIAGNOSIS — C786 Secondary malignant neoplasm of retroperitoneum and peritoneum: Secondary | ICD-10-CM | POA: Diagnosis not present

## 2023-03-10 DIAGNOSIS — C7A8 Other malignant neuroendocrine tumors: Secondary | ICD-10-CM | POA: Diagnosis not present

## 2023-03-10 DIAGNOSIS — R0600 Dyspnea, unspecified: Secondary | ICD-10-CM | POA: Diagnosis not present

## 2023-03-10 DIAGNOSIS — C787 Secondary malignant neoplasm of liver and intrahepatic bile duct: Secondary | ICD-10-CM | POA: Diagnosis not present

## 2023-03-10 DIAGNOSIS — C7951 Secondary malignant neoplasm of bone: Secondary | ICD-10-CM | POA: Diagnosis not present

## 2023-03-10 DIAGNOSIS — C7880 Secondary malignant neoplasm of unspecified digestive organ: Secondary | ICD-10-CM | POA: Diagnosis not present

## 2023-03-10 DIAGNOSIS — C786 Secondary malignant neoplasm of retroperitoneum and peritoneum: Secondary | ICD-10-CM | POA: Diagnosis not present

## 2023-03-11 DIAGNOSIS — C787 Secondary malignant neoplasm of liver and intrahepatic bile duct: Secondary | ICD-10-CM | POA: Diagnosis not present

## 2023-03-11 DIAGNOSIS — C7880 Secondary malignant neoplasm of unspecified digestive organ: Secondary | ICD-10-CM | POA: Diagnosis not present

## 2023-03-11 DIAGNOSIS — R0600 Dyspnea, unspecified: Secondary | ICD-10-CM | POA: Diagnosis not present

## 2023-03-11 DIAGNOSIS — C7951 Secondary malignant neoplasm of bone: Secondary | ICD-10-CM | POA: Diagnosis not present

## 2023-03-11 DIAGNOSIS — C786 Secondary malignant neoplasm of retroperitoneum and peritoneum: Secondary | ICD-10-CM | POA: Diagnosis not present

## 2023-03-11 DIAGNOSIS — C7A8 Other malignant neuroendocrine tumors: Secondary | ICD-10-CM | POA: Diagnosis not present

## 2023-03-12 DIAGNOSIS — C7880 Secondary malignant neoplasm of unspecified digestive organ: Secondary | ICD-10-CM | POA: Diagnosis not present

## 2023-03-12 DIAGNOSIS — C7A8 Other malignant neuroendocrine tumors: Secondary | ICD-10-CM | POA: Diagnosis not present

## 2023-03-12 DIAGNOSIS — C787 Secondary malignant neoplasm of liver and intrahepatic bile duct: Secondary | ICD-10-CM | POA: Diagnosis not present

## 2023-03-12 DIAGNOSIS — R0600 Dyspnea, unspecified: Secondary | ICD-10-CM | POA: Diagnosis not present

## 2023-03-12 DIAGNOSIS — C7951 Secondary malignant neoplasm of bone: Secondary | ICD-10-CM | POA: Diagnosis not present

## 2023-03-12 DIAGNOSIS — C786 Secondary malignant neoplasm of retroperitoneum and peritoneum: Secondary | ICD-10-CM | POA: Diagnosis not present

## 2023-03-13 DIAGNOSIS — C786 Secondary malignant neoplasm of retroperitoneum and peritoneum: Secondary | ICD-10-CM | POA: Diagnosis not present

## 2023-03-13 DIAGNOSIS — C7A8 Other malignant neuroendocrine tumors: Secondary | ICD-10-CM | POA: Diagnosis not present

## 2023-03-13 DIAGNOSIS — C7880 Secondary malignant neoplasm of unspecified digestive organ: Secondary | ICD-10-CM | POA: Diagnosis not present

## 2023-03-13 DIAGNOSIS — C7951 Secondary malignant neoplasm of bone: Secondary | ICD-10-CM | POA: Diagnosis not present

## 2023-03-13 DIAGNOSIS — R0600 Dyspnea, unspecified: Secondary | ICD-10-CM | POA: Diagnosis not present

## 2023-03-13 DIAGNOSIS — C787 Secondary malignant neoplasm of liver and intrahepatic bile duct: Secondary | ICD-10-CM | POA: Diagnosis not present

## 2023-03-14 DIAGNOSIS — C787 Secondary malignant neoplasm of liver and intrahepatic bile duct: Secondary | ICD-10-CM | POA: Diagnosis not present

## 2023-03-14 DIAGNOSIS — C7A8 Other malignant neuroendocrine tumors: Secondary | ICD-10-CM | POA: Diagnosis not present

## 2023-03-14 DIAGNOSIS — R0600 Dyspnea, unspecified: Secondary | ICD-10-CM | POA: Diagnosis not present

## 2023-03-14 DIAGNOSIS — C7951 Secondary malignant neoplasm of bone: Secondary | ICD-10-CM | POA: Diagnosis not present

## 2023-03-14 DIAGNOSIS — C7880 Secondary malignant neoplasm of unspecified digestive organ: Secondary | ICD-10-CM | POA: Diagnosis not present

## 2023-03-14 DIAGNOSIS — C786 Secondary malignant neoplasm of retroperitoneum and peritoneum: Secondary | ICD-10-CM | POA: Diagnosis not present

## 2023-03-15 DIAGNOSIS — C786 Secondary malignant neoplasm of retroperitoneum and peritoneum: Secondary | ICD-10-CM | POA: Diagnosis not present

## 2023-03-15 DIAGNOSIS — C787 Secondary malignant neoplasm of liver and intrahepatic bile duct: Secondary | ICD-10-CM | POA: Diagnosis not present

## 2023-03-15 DIAGNOSIS — R0600 Dyspnea, unspecified: Secondary | ICD-10-CM | POA: Diagnosis not present

## 2023-03-15 DIAGNOSIS — C7951 Secondary malignant neoplasm of bone: Secondary | ICD-10-CM | POA: Diagnosis not present

## 2023-03-15 DIAGNOSIS — C7880 Secondary malignant neoplasm of unspecified digestive organ: Secondary | ICD-10-CM | POA: Diagnosis not present

## 2023-03-15 DIAGNOSIS — C7A8 Other malignant neuroendocrine tumors: Secondary | ICD-10-CM | POA: Diagnosis not present

## 2023-03-16 DIAGNOSIS — C787 Secondary malignant neoplasm of liver and intrahepatic bile duct: Secondary | ICD-10-CM | POA: Diagnosis not present

## 2023-03-16 DIAGNOSIS — C7951 Secondary malignant neoplasm of bone: Secondary | ICD-10-CM | POA: Diagnosis not present

## 2023-03-16 DIAGNOSIS — C7880 Secondary malignant neoplasm of unspecified digestive organ: Secondary | ICD-10-CM | POA: Diagnosis not present

## 2023-03-16 DIAGNOSIS — C786 Secondary malignant neoplasm of retroperitoneum and peritoneum: Secondary | ICD-10-CM | POA: Diagnosis not present

## 2023-03-16 DIAGNOSIS — R0600 Dyspnea, unspecified: Secondary | ICD-10-CM | POA: Diagnosis not present

## 2023-03-16 DIAGNOSIS — C7A8 Other malignant neuroendocrine tumors: Secondary | ICD-10-CM | POA: Diagnosis not present

## 2023-03-17 DIAGNOSIS — C786 Secondary malignant neoplasm of retroperitoneum and peritoneum: Secondary | ICD-10-CM | POA: Diagnosis not present

## 2023-03-17 DIAGNOSIS — R0600 Dyspnea, unspecified: Secondary | ICD-10-CM | POA: Diagnosis not present

## 2023-03-17 DIAGNOSIS — C7951 Secondary malignant neoplasm of bone: Secondary | ICD-10-CM | POA: Diagnosis not present

## 2023-03-17 DIAGNOSIS — C787 Secondary malignant neoplasm of liver and intrahepatic bile duct: Secondary | ICD-10-CM | POA: Diagnosis not present

## 2023-03-17 DIAGNOSIS — C7880 Secondary malignant neoplasm of unspecified digestive organ: Secondary | ICD-10-CM | POA: Diagnosis not present

## 2023-03-17 DIAGNOSIS — C7A8 Other malignant neuroendocrine tumors: Secondary | ICD-10-CM | POA: Diagnosis not present

## 2023-03-18 DIAGNOSIS — C7951 Secondary malignant neoplasm of bone: Secondary | ICD-10-CM | POA: Diagnosis not present

## 2023-03-18 DIAGNOSIS — C7880 Secondary malignant neoplasm of unspecified digestive organ: Secondary | ICD-10-CM | POA: Diagnosis not present

## 2023-03-18 DIAGNOSIS — C787 Secondary malignant neoplasm of liver and intrahepatic bile duct: Secondary | ICD-10-CM | POA: Diagnosis not present

## 2023-03-18 DIAGNOSIS — R0600 Dyspnea, unspecified: Secondary | ICD-10-CM | POA: Diagnosis not present

## 2023-03-18 DIAGNOSIS — C7A8 Other malignant neuroendocrine tumors: Secondary | ICD-10-CM | POA: Diagnosis not present

## 2023-03-18 DIAGNOSIS — C786 Secondary malignant neoplasm of retroperitoneum and peritoneum: Secondary | ICD-10-CM | POA: Diagnosis not present

## 2023-03-19 DIAGNOSIS — C7951 Secondary malignant neoplasm of bone: Secondary | ICD-10-CM | POA: Diagnosis not present

## 2023-03-19 DIAGNOSIS — R0600 Dyspnea, unspecified: Secondary | ICD-10-CM | POA: Diagnosis not present

## 2023-03-19 DIAGNOSIS — C7A8 Other malignant neuroendocrine tumors: Secondary | ICD-10-CM | POA: Diagnosis not present

## 2023-03-19 DIAGNOSIS — C7880 Secondary malignant neoplasm of unspecified digestive organ: Secondary | ICD-10-CM | POA: Diagnosis not present

## 2023-03-19 DIAGNOSIS — C787 Secondary malignant neoplasm of liver and intrahepatic bile duct: Secondary | ICD-10-CM | POA: Diagnosis not present

## 2023-03-19 DIAGNOSIS — C786 Secondary malignant neoplasm of retroperitoneum and peritoneum: Secondary | ICD-10-CM | POA: Diagnosis not present

## 2023-03-20 DIAGNOSIS — C7880 Secondary malignant neoplasm of unspecified digestive organ: Secondary | ICD-10-CM | POA: Diagnosis not present

## 2023-03-20 DIAGNOSIS — C787 Secondary malignant neoplasm of liver and intrahepatic bile duct: Secondary | ICD-10-CM | POA: Diagnosis not present

## 2023-03-20 DIAGNOSIS — C7A8 Other malignant neuroendocrine tumors: Secondary | ICD-10-CM | POA: Diagnosis not present

## 2023-03-20 DIAGNOSIS — C7951 Secondary malignant neoplasm of bone: Secondary | ICD-10-CM | POA: Diagnosis not present

## 2023-03-20 DIAGNOSIS — R0600 Dyspnea, unspecified: Secondary | ICD-10-CM | POA: Diagnosis not present

## 2023-03-20 DIAGNOSIS — C786 Secondary malignant neoplasm of retroperitoneum and peritoneum: Secondary | ICD-10-CM | POA: Diagnosis not present

## 2023-03-21 DIAGNOSIS — C7880 Secondary malignant neoplasm of unspecified digestive organ: Secondary | ICD-10-CM | POA: Diagnosis not present

## 2023-03-21 DIAGNOSIS — C787 Secondary malignant neoplasm of liver and intrahepatic bile duct: Secondary | ICD-10-CM | POA: Diagnosis not present

## 2023-03-21 DIAGNOSIS — C786 Secondary malignant neoplasm of retroperitoneum and peritoneum: Secondary | ICD-10-CM | POA: Diagnosis not present

## 2023-03-21 DIAGNOSIS — R0600 Dyspnea, unspecified: Secondary | ICD-10-CM | POA: Diagnosis not present

## 2023-03-21 DIAGNOSIS — C7951 Secondary malignant neoplasm of bone: Secondary | ICD-10-CM | POA: Diagnosis not present

## 2023-03-21 DIAGNOSIS — C7A8 Other malignant neuroendocrine tumors: Secondary | ICD-10-CM | POA: Diagnosis not present

## 2023-03-22 DIAGNOSIS — C7880 Secondary malignant neoplasm of unspecified digestive organ: Secondary | ICD-10-CM | POA: Diagnosis not present

## 2023-03-22 DIAGNOSIS — C7A8 Other malignant neuroendocrine tumors: Secondary | ICD-10-CM | POA: Diagnosis not present

## 2023-03-22 DIAGNOSIS — C786 Secondary malignant neoplasm of retroperitoneum and peritoneum: Secondary | ICD-10-CM | POA: Diagnosis not present

## 2023-03-22 DIAGNOSIS — C787 Secondary malignant neoplasm of liver and intrahepatic bile duct: Secondary | ICD-10-CM | POA: Diagnosis not present

## 2023-03-22 DIAGNOSIS — C7951 Secondary malignant neoplasm of bone: Secondary | ICD-10-CM | POA: Diagnosis not present

## 2023-03-22 DIAGNOSIS — R0600 Dyspnea, unspecified: Secondary | ICD-10-CM | POA: Diagnosis not present

## 2023-03-23 DIAGNOSIS — C7951 Secondary malignant neoplasm of bone: Secondary | ICD-10-CM | POA: Diagnosis not present

## 2023-03-23 DIAGNOSIS — C7A8 Other malignant neuroendocrine tumors: Secondary | ICD-10-CM | POA: Diagnosis not present

## 2023-03-23 DIAGNOSIS — C7880 Secondary malignant neoplasm of unspecified digestive organ: Secondary | ICD-10-CM | POA: Diagnosis not present

## 2023-03-23 DIAGNOSIS — C786 Secondary malignant neoplasm of retroperitoneum and peritoneum: Secondary | ICD-10-CM | POA: Diagnosis not present

## 2023-03-23 DIAGNOSIS — C787 Secondary malignant neoplasm of liver and intrahepatic bile duct: Secondary | ICD-10-CM | POA: Diagnosis not present

## 2023-03-23 DIAGNOSIS — R0600 Dyspnea, unspecified: Secondary | ICD-10-CM | POA: Diagnosis not present

## 2023-03-24 DIAGNOSIS — C786 Secondary malignant neoplasm of retroperitoneum and peritoneum: Secondary | ICD-10-CM | POA: Diagnosis not present

## 2023-03-24 DIAGNOSIS — C7951 Secondary malignant neoplasm of bone: Secondary | ICD-10-CM | POA: Diagnosis not present

## 2023-03-24 DIAGNOSIS — C7880 Secondary malignant neoplasm of unspecified digestive organ: Secondary | ICD-10-CM | POA: Diagnosis not present

## 2023-03-24 DIAGNOSIS — R0600 Dyspnea, unspecified: Secondary | ICD-10-CM | POA: Diagnosis not present

## 2023-03-24 DIAGNOSIS — C787 Secondary malignant neoplasm of liver and intrahepatic bile duct: Secondary | ICD-10-CM | POA: Diagnosis not present

## 2023-03-24 DIAGNOSIS — C7A8 Other malignant neuroendocrine tumors: Secondary | ICD-10-CM | POA: Diagnosis not present

## 2023-03-25 DIAGNOSIS — C786 Secondary malignant neoplasm of retroperitoneum and peritoneum: Secondary | ICD-10-CM | POA: Diagnosis not present

## 2023-03-25 DIAGNOSIS — C7951 Secondary malignant neoplasm of bone: Secondary | ICD-10-CM | POA: Diagnosis not present

## 2023-03-25 DIAGNOSIS — C787 Secondary malignant neoplasm of liver and intrahepatic bile duct: Secondary | ICD-10-CM | POA: Diagnosis not present

## 2023-03-25 DIAGNOSIS — C7880 Secondary malignant neoplasm of unspecified digestive organ: Secondary | ICD-10-CM | POA: Diagnosis not present

## 2023-03-25 DIAGNOSIS — C7A8 Other malignant neuroendocrine tumors: Secondary | ICD-10-CM | POA: Diagnosis not present

## 2023-03-25 DIAGNOSIS — R0600 Dyspnea, unspecified: Secondary | ICD-10-CM | POA: Diagnosis not present

## 2023-03-26 DIAGNOSIS — C7880 Secondary malignant neoplasm of unspecified digestive organ: Secondary | ICD-10-CM | POA: Diagnosis not present

## 2023-03-26 DIAGNOSIS — R0600 Dyspnea, unspecified: Secondary | ICD-10-CM | POA: Diagnosis not present

## 2023-03-26 DIAGNOSIS — C7A8 Other malignant neuroendocrine tumors: Secondary | ICD-10-CM | POA: Diagnosis not present

## 2023-03-26 DIAGNOSIS — C787 Secondary malignant neoplasm of liver and intrahepatic bile duct: Secondary | ICD-10-CM | POA: Diagnosis not present

## 2023-03-26 DIAGNOSIS — C786 Secondary malignant neoplasm of retroperitoneum and peritoneum: Secondary | ICD-10-CM | POA: Diagnosis not present

## 2023-03-26 DIAGNOSIS — C7951 Secondary malignant neoplasm of bone: Secondary | ICD-10-CM | POA: Diagnosis not present

## 2023-03-27 DIAGNOSIS — R0600 Dyspnea, unspecified: Secondary | ICD-10-CM | POA: Diagnosis not present

## 2023-03-27 DIAGNOSIS — C787 Secondary malignant neoplasm of liver and intrahepatic bile duct: Secondary | ICD-10-CM | POA: Diagnosis not present

## 2023-03-27 DIAGNOSIS — C7951 Secondary malignant neoplasm of bone: Secondary | ICD-10-CM | POA: Diagnosis not present

## 2023-03-27 DIAGNOSIS — C786 Secondary malignant neoplasm of retroperitoneum and peritoneum: Secondary | ICD-10-CM | POA: Diagnosis not present

## 2023-03-27 DIAGNOSIS — C7A8 Other malignant neuroendocrine tumors: Secondary | ICD-10-CM | POA: Diagnosis not present

## 2023-03-27 DIAGNOSIS — C7880 Secondary malignant neoplasm of unspecified digestive organ: Secondary | ICD-10-CM | POA: Diagnosis not present

## 2023-03-28 DIAGNOSIS — R0600 Dyspnea, unspecified: Secondary | ICD-10-CM | POA: Diagnosis not present

## 2023-03-28 DIAGNOSIS — C7A8 Other malignant neuroendocrine tumors: Secondary | ICD-10-CM | POA: Diagnosis not present

## 2023-03-28 DIAGNOSIS — C786 Secondary malignant neoplasm of retroperitoneum and peritoneum: Secondary | ICD-10-CM | POA: Diagnosis not present

## 2023-03-28 DIAGNOSIS — C7951 Secondary malignant neoplasm of bone: Secondary | ICD-10-CM | POA: Diagnosis not present

## 2023-03-28 DIAGNOSIS — Z23 Encounter for immunization: Secondary | ICD-10-CM | POA: Diagnosis not present

## 2023-03-28 DIAGNOSIS — C787 Secondary malignant neoplasm of liver and intrahepatic bile duct: Secondary | ICD-10-CM | POA: Diagnosis not present

## 2023-03-28 DIAGNOSIS — C7880 Secondary malignant neoplasm of unspecified digestive organ: Secondary | ICD-10-CM | POA: Diagnosis not present

## 2023-03-29 DIAGNOSIS — C7880 Secondary malignant neoplasm of unspecified digestive organ: Secondary | ICD-10-CM | POA: Diagnosis not present

## 2023-03-29 DIAGNOSIS — C7A8 Other malignant neuroendocrine tumors: Secondary | ICD-10-CM | POA: Diagnosis not present

## 2023-03-29 DIAGNOSIS — C786 Secondary malignant neoplasm of retroperitoneum and peritoneum: Secondary | ICD-10-CM | POA: Diagnosis not present

## 2023-03-29 DIAGNOSIS — C7951 Secondary malignant neoplasm of bone: Secondary | ICD-10-CM | POA: Diagnosis not present

## 2023-03-29 DIAGNOSIS — C787 Secondary malignant neoplasm of liver and intrahepatic bile duct: Secondary | ICD-10-CM | POA: Diagnosis not present

## 2023-03-29 DIAGNOSIS — R0600 Dyspnea, unspecified: Secondary | ICD-10-CM | POA: Diagnosis not present

## 2023-03-30 DIAGNOSIS — C7A8 Other malignant neuroendocrine tumors: Secondary | ICD-10-CM | POA: Diagnosis not present

## 2023-03-30 DIAGNOSIS — C786 Secondary malignant neoplasm of retroperitoneum and peritoneum: Secondary | ICD-10-CM | POA: Diagnosis not present

## 2023-03-30 DIAGNOSIS — C7951 Secondary malignant neoplasm of bone: Secondary | ICD-10-CM | POA: Diagnosis not present

## 2023-03-30 DIAGNOSIS — C7880 Secondary malignant neoplasm of unspecified digestive organ: Secondary | ICD-10-CM | POA: Diagnosis not present

## 2023-03-30 DIAGNOSIS — C787 Secondary malignant neoplasm of liver and intrahepatic bile duct: Secondary | ICD-10-CM | POA: Diagnosis not present

## 2023-03-30 DIAGNOSIS — R0600 Dyspnea, unspecified: Secondary | ICD-10-CM | POA: Diagnosis not present

## 2023-03-31 DIAGNOSIS — C7951 Secondary malignant neoplasm of bone: Secondary | ICD-10-CM | POA: Diagnosis not present

## 2023-03-31 DIAGNOSIS — C7A8 Other malignant neuroendocrine tumors: Secondary | ICD-10-CM | POA: Diagnosis not present

## 2023-03-31 DIAGNOSIS — C7880 Secondary malignant neoplasm of unspecified digestive organ: Secondary | ICD-10-CM | POA: Diagnosis not present

## 2023-03-31 DIAGNOSIS — C786 Secondary malignant neoplasm of retroperitoneum and peritoneum: Secondary | ICD-10-CM | POA: Diagnosis not present

## 2023-03-31 DIAGNOSIS — R0600 Dyspnea, unspecified: Secondary | ICD-10-CM | POA: Diagnosis not present

## 2023-03-31 DIAGNOSIS — C787 Secondary malignant neoplasm of liver and intrahepatic bile duct: Secondary | ICD-10-CM | POA: Diagnosis not present

## 2023-04-01 DIAGNOSIS — C7A8 Other malignant neuroendocrine tumors: Secondary | ICD-10-CM | POA: Diagnosis not present

## 2023-04-01 DIAGNOSIS — C7951 Secondary malignant neoplasm of bone: Secondary | ICD-10-CM | POA: Diagnosis not present

## 2023-04-01 DIAGNOSIS — C787 Secondary malignant neoplasm of liver and intrahepatic bile duct: Secondary | ICD-10-CM | POA: Diagnosis not present

## 2023-04-01 DIAGNOSIS — C7880 Secondary malignant neoplasm of unspecified digestive organ: Secondary | ICD-10-CM | POA: Diagnosis not present

## 2023-04-01 DIAGNOSIS — C786 Secondary malignant neoplasm of retroperitoneum and peritoneum: Secondary | ICD-10-CM | POA: Diagnosis not present

## 2023-04-01 DIAGNOSIS — R0600 Dyspnea, unspecified: Secondary | ICD-10-CM | POA: Diagnosis not present

## 2023-04-02 DIAGNOSIS — C7A8 Other malignant neuroendocrine tumors: Secondary | ICD-10-CM | POA: Diagnosis not present

## 2023-04-02 DIAGNOSIS — R0600 Dyspnea, unspecified: Secondary | ICD-10-CM | POA: Diagnosis not present

## 2023-04-02 DIAGNOSIS — C7880 Secondary malignant neoplasm of unspecified digestive organ: Secondary | ICD-10-CM | POA: Diagnosis not present

## 2023-04-02 DIAGNOSIS — C786 Secondary malignant neoplasm of retroperitoneum and peritoneum: Secondary | ICD-10-CM | POA: Diagnosis not present

## 2023-04-02 DIAGNOSIS — C787 Secondary malignant neoplasm of liver and intrahepatic bile duct: Secondary | ICD-10-CM | POA: Diagnosis not present

## 2023-04-02 DIAGNOSIS — C7951 Secondary malignant neoplasm of bone: Secondary | ICD-10-CM | POA: Diagnosis not present

## 2023-04-03 DIAGNOSIS — N400 Enlarged prostate without lower urinary tract symptoms: Secondary | ICD-10-CM | POA: Diagnosis not present

## 2023-04-03 DIAGNOSIS — R1084 Generalized abdominal pain: Secondary | ICD-10-CM | POA: Diagnosis not present

## 2023-04-03 DIAGNOSIS — R0600 Dyspnea, unspecified: Secondary | ICD-10-CM | POA: Diagnosis not present

## 2023-04-03 DIAGNOSIS — C7951 Secondary malignant neoplasm of bone: Secondary | ICD-10-CM | POA: Diagnosis not present

## 2023-04-03 DIAGNOSIS — C7880 Secondary malignant neoplasm of unspecified digestive organ: Secondary | ICD-10-CM | POA: Diagnosis not present

## 2023-04-03 DIAGNOSIS — J449 Chronic obstructive pulmonary disease, unspecified: Secondary | ICD-10-CM | POA: Diagnosis not present

## 2023-04-03 DIAGNOSIS — C7A8 Other malignant neuroendocrine tumors: Secondary | ICD-10-CM | POA: Diagnosis not present

## 2023-04-03 DIAGNOSIS — E785 Hyperlipidemia, unspecified: Secondary | ICD-10-CM | POA: Diagnosis not present

## 2023-04-03 DIAGNOSIS — C787 Secondary malignant neoplasm of liver and intrahepatic bile duct: Secondary | ICD-10-CM | POA: Diagnosis not present

## 2023-04-03 DIAGNOSIS — C786 Secondary malignant neoplasm of retroperitoneum and peritoneum: Secondary | ICD-10-CM | POA: Diagnosis not present

## 2023-04-03 DIAGNOSIS — F32A Depression, unspecified: Secondary | ICD-10-CM | POA: Diagnosis not present

## 2023-04-03 DIAGNOSIS — Z87891 Personal history of nicotine dependence: Secondary | ICD-10-CM | POA: Diagnosis not present

## 2023-04-04 DIAGNOSIS — C7951 Secondary malignant neoplasm of bone: Secondary | ICD-10-CM | POA: Diagnosis not present

## 2023-04-04 DIAGNOSIS — C786 Secondary malignant neoplasm of retroperitoneum and peritoneum: Secondary | ICD-10-CM | POA: Diagnosis not present

## 2023-04-04 DIAGNOSIS — C7A8 Other malignant neuroendocrine tumors: Secondary | ICD-10-CM | POA: Diagnosis not present

## 2023-04-04 DIAGNOSIS — R0600 Dyspnea, unspecified: Secondary | ICD-10-CM | POA: Diagnosis not present

## 2023-04-04 DIAGNOSIS — C787 Secondary malignant neoplasm of liver and intrahepatic bile duct: Secondary | ICD-10-CM | POA: Diagnosis not present

## 2023-04-04 DIAGNOSIS — C7880 Secondary malignant neoplasm of unspecified digestive organ: Secondary | ICD-10-CM | POA: Diagnosis not present

## 2023-04-05 DIAGNOSIS — C7A8 Other malignant neuroendocrine tumors: Secondary | ICD-10-CM | POA: Diagnosis not present

## 2023-04-05 DIAGNOSIS — C786 Secondary malignant neoplasm of retroperitoneum and peritoneum: Secondary | ICD-10-CM | POA: Diagnosis not present

## 2023-04-05 DIAGNOSIS — R0600 Dyspnea, unspecified: Secondary | ICD-10-CM | POA: Diagnosis not present

## 2023-04-05 DIAGNOSIS — C7951 Secondary malignant neoplasm of bone: Secondary | ICD-10-CM | POA: Diagnosis not present

## 2023-04-05 DIAGNOSIS — C787 Secondary malignant neoplasm of liver and intrahepatic bile duct: Secondary | ICD-10-CM | POA: Diagnosis not present

## 2023-04-05 DIAGNOSIS — C7880 Secondary malignant neoplasm of unspecified digestive organ: Secondary | ICD-10-CM | POA: Diagnosis not present

## 2023-04-06 DIAGNOSIS — C7951 Secondary malignant neoplasm of bone: Secondary | ICD-10-CM | POA: Diagnosis not present

## 2023-04-06 DIAGNOSIS — R0600 Dyspnea, unspecified: Secondary | ICD-10-CM | POA: Diagnosis not present

## 2023-04-06 DIAGNOSIS — C7A8 Other malignant neuroendocrine tumors: Secondary | ICD-10-CM | POA: Diagnosis not present

## 2023-04-06 DIAGNOSIS — C7880 Secondary malignant neoplasm of unspecified digestive organ: Secondary | ICD-10-CM | POA: Diagnosis not present

## 2023-04-06 DIAGNOSIS — C787 Secondary malignant neoplasm of liver and intrahepatic bile duct: Secondary | ICD-10-CM | POA: Diagnosis not present

## 2023-04-06 DIAGNOSIS — C786 Secondary malignant neoplasm of retroperitoneum and peritoneum: Secondary | ICD-10-CM | POA: Diagnosis not present

## 2023-04-07 DIAGNOSIS — R0600 Dyspnea, unspecified: Secondary | ICD-10-CM | POA: Diagnosis not present

## 2023-04-07 DIAGNOSIS — C787 Secondary malignant neoplasm of liver and intrahepatic bile duct: Secondary | ICD-10-CM | POA: Diagnosis not present

## 2023-04-07 DIAGNOSIS — C7880 Secondary malignant neoplasm of unspecified digestive organ: Secondary | ICD-10-CM | POA: Diagnosis not present

## 2023-04-07 DIAGNOSIS — C7951 Secondary malignant neoplasm of bone: Secondary | ICD-10-CM | POA: Diagnosis not present

## 2023-04-07 DIAGNOSIS — C7A8 Other malignant neuroendocrine tumors: Secondary | ICD-10-CM | POA: Diagnosis not present

## 2023-04-07 DIAGNOSIS — C786 Secondary malignant neoplasm of retroperitoneum and peritoneum: Secondary | ICD-10-CM | POA: Diagnosis not present

## 2023-04-08 DIAGNOSIS — C7951 Secondary malignant neoplasm of bone: Secondary | ICD-10-CM | POA: Diagnosis not present

## 2023-04-08 DIAGNOSIS — C7A8 Other malignant neuroendocrine tumors: Secondary | ICD-10-CM | POA: Diagnosis not present

## 2023-04-08 DIAGNOSIS — C7880 Secondary malignant neoplasm of unspecified digestive organ: Secondary | ICD-10-CM | POA: Diagnosis not present

## 2023-04-08 DIAGNOSIS — R0600 Dyspnea, unspecified: Secondary | ICD-10-CM | POA: Diagnosis not present

## 2023-04-08 DIAGNOSIS — C787 Secondary malignant neoplasm of liver and intrahepatic bile duct: Secondary | ICD-10-CM | POA: Diagnosis not present

## 2023-04-08 DIAGNOSIS — C786 Secondary malignant neoplasm of retroperitoneum and peritoneum: Secondary | ICD-10-CM | POA: Diagnosis not present

## 2023-04-09 DIAGNOSIS — C786 Secondary malignant neoplasm of retroperitoneum and peritoneum: Secondary | ICD-10-CM | POA: Diagnosis not present

## 2023-04-09 DIAGNOSIS — C7880 Secondary malignant neoplasm of unspecified digestive organ: Secondary | ICD-10-CM | POA: Diagnosis not present

## 2023-04-09 DIAGNOSIS — C7951 Secondary malignant neoplasm of bone: Secondary | ICD-10-CM | POA: Diagnosis not present

## 2023-04-09 DIAGNOSIS — C7A8 Other malignant neuroendocrine tumors: Secondary | ICD-10-CM | POA: Diagnosis not present

## 2023-04-09 DIAGNOSIS — C787 Secondary malignant neoplasm of liver and intrahepatic bile duct: Secondary | ICD-10-CM | POA: Diagnosis not present

## 2023-04-09 DIAGNOSIS — R0600 Dyspnea, unspecified: Secondary | ICD-10-CM | POA: Diagnosis not present

## 2023-04-10 DIAGNOSIS — C787 Secondary malignant neoplasm of liver and intrahepatic bile duct: Secondary | ICD-10-CM | POA: Diagnosis not present

## 2023-04-10 DIAGNOSIS — C7880 Secondary malignant neoplasm of unspecified digestive organ: Secondary | ICD-10-CM | POA: Diagnosis not present

## 2023-04-10 DIAGNOSIS — C786 Secondary malignant neoplasm of retroperitoneum and peritoneum: Secondary | ICD-10-CM | POA: Diagnosis not present

## 2023-04-10 DIAGNOSIS — C7951 Secondary malignant neoplasm of bone: Secondary | ICD-10-CM | POA: Diagnosis not present

## 2023-04-10 DIAGNOSIS — R0600 Dyspnea, unspecified: Secondary | ICD-10-CM | POA: Diagnosis not present

## 2023-04-10 DIAGNOSIS — C7A8 Other malignant neuroendocrine tumors: Secondary | ICD-10-CM | POA: Diagnosis not present

## 2023-04-11 DIAGNOSIS — C7A8 Other malignant neuroendocrine tumors: Secondary | ICD-10-CM | POA: Diagnosis not present

## 2023-04-11 DIAGNOSIS — R0600 Dyspnea, unspecified: Secondary | ICD-10-CM | POA: Diagnosis not present

## 2023-04-11 DIAGNOSIS — C787 Secondary malignant neoplasm of liver and intrahepatic bile duct: Secondary | ICD-10-CM | POA: Diagnosis not present

## 2023-04-11 DIAGNOSIS — C7880 Secondary malignant neoplasm of unspecified digestive organ: Secondary | ICD-10-CM | POA: Diagnosis not present

## 2023-04-11 DIAGNOSIS — C786 Secondary malignant neoplasm of retroperitoneum and peritoneum: Secondary | ICD-10-CM | POA: Diagnosis not present

## 2023-04-11 DIAGNOSIS — C7951 Secondary malignant neoplasm of bone: Secondary | ICD-10-CM | POA: Diagnosis not present

## 2023-04-12 DIAGNOSIS — R0600 Dyspnea, unspecified: Secondary | ICD-10-CM | POA: Diagnosis not present

## 2023-04-12 DIAGNOSIS — C786 Secondary malignant neoplasm of retroperitoneum and peritoneum: Secondary | ICD-10-CM | POA: Diagnosis not present

## 2023-04-12 DIAGNOSIS — C787 Secondary malignant neoplasm of liver and intrahepatic bile duct: Secondary | ICD-10-CM | POA: Diagnosis not present

## 2023-04-12 DIAGNOSIS — C7951 Secondary malignant neoplasm of bone: Secondary | ICD-10-CM | POA: Diagnosis not present

## 2023-04-12 DIAGNOSIS — C7880 Secondary malignant neoplasm of unspecified digestive organ: Secondary | ICD-10-CM | POA: Diagnosis not present

## 2023-04-12 DIAGNOSIS — C7A8 Other malignant neuroendocrine tumors: Secondary | ICD-10-CM | POA: Diagnosis not present

## 2023-04-13 DIAGNOSIS — R0600 Dyspnea, unspecified: Secondary | ICD-10-CM | POA: Diagnosis not present

## 2023-04-13 DIAGNOSIS — C7951 Secondary malignant neoplasm of bone: Secondary | ICD-10-CM | POA: Diagnosis not present

## 2023-04-13 DIAGNOSIS — C7880 Secondary malignant neoplasm of unspecified digestive organ: Secondary | ICD-10-CM | POA: Diagnosis not present

## 2023-04-13 DIAGNOSIS — C787 Secondary malignant neoplasm of liver and intrahepatic bile duct: Secondary | ICD-10-CM | POA: Diagnosis not present

## 2023-04-13 DIAGNOSIS — C7A8 Other malignant neuroendocrine tumors: Secondary | ICD-10-CM | POA: Diagnosis not present

## 2023-04-13 DIAGNOSIS — C786 Secondary malignant neoplasm of retroperitoneum and peritoneum: Secondary | ICD-10-CM | POA: Diagnosis not present

## 2023-04-14 DIAGNOSIS — C787 Secondary malignant neoplasm of liver and intrahepatic bile duct: Secondary | ICD-10-CM | POA: Diagnosis not present

## 2023-04-14 DIAGNOSIS — C7880 Secondary malignant neoplasm of unspecified digestive organ: Secondary | ICD-10-CM | POA: Diagnosis not present

## 2023-04-14 DIAGNOSIS — R0600 Dyspnea, unspecified: Secondary | ICD-10-CM | POA: Diagnosis not present

## 2023-04-14 DIAGNOSIS — C786 Secondary malignant neoplasm of retroperitoneum and peritoneum: Secondary | ICD-10-CM | POA: Diagnosis not present

## 2023-04-14 DIAGNOSIS — C7A8 Other malignant neuroendocrine tumors: Secondary | ICD-10-CM | POA: Diagnosis not present

## 2023-04-14 DIAGNOSIS — C7951 Secondary malignant neoplasm of bone: Secondary | ICD-10-CM | POA: Diagnosis not present

## 2023-04-15 DIAGNOSIS — C786 Secondary malignant neoplasm of retroperitoneum and peritoneum: Secondary | ICD-10-CM | POA: Diagnosis not present

## 2023-04-15 DIAGNOSIS — C7A8 Other malignant neuroendocrine tumors: Secondary | ICD-10-CM | POA: Diagnosis not present

## 2023-04-15 DIAGNOSIS — R0600 Dyspnea, unspecified: Secondary | ICD-10-CM | POA: Diagnosis not present

## 2023-04-15 DIAGNOSIS — C7951 Secondary malignant neoplasm of bone: Secondary | ICD-10-CM | POA: Diagnosis not present

## 2023-04-15 DIAGNOSIS — C7880 Secondary malignant neoplasm of unspecified digestive organ: Secondary | ICD-10-CM | POA: Diagnosis not present

## 2023-04-15 DIAGNOSIS — C787 Secondary malignant neoplasm of liver and intrahepatic bile duct: Secondary | ICD-10-CM | POA: Diagnosis not present

## 2023-04-16 DIAGNOSIS — C787 Secondary malignant neoplasm of liver and intrahepatic bile duct: Secondary | ICD-10-CM | POA: Diagnosis not present

## 2023-04-16 DIAGNOSIS — C7951 Secondary malignant neoplasm of bone: Secondary | ICD-10-CM | POA: Diagnosis not present

## 2023-04-16 DIAGNOSIS — C7A8 Other malignant neuroendocrine tumors: Secondary | ICD-10-CM | POA: Diagnosis not present

## 2023-04-16 DIAGNOSIS — C7880 Secondary malignant neoplasm of unspecified digestive organ: Secondary | ICD-10-CM | POA: Diagnosis not present

## 2023-04-16 DIAGNOSIS — R0600 Dyspnea, unspecified: Secondary | ICD-10-CM | POA: Diagnosis not present

## 2023-04-16 DIAGNOSIS — C786 Secondary malignant neoplasm of retroperitoneum and peritoneum: Secondary | ICD-10-CM | POA: Diagnosis not present

## 2023-04-17 DIAGNOSIS — C7951 Secondary malignant neoplasm of bone: Secondary | ICD-10-CM | POA: Diagnosis not present

## 2023-04-17 DIAGNOSIS — C7880 Secondary malignant neoplasm of unspecified digestive organ: Secondary | ICD-10-CM | POA: Diagnosis not present

## 2023-04-17 DIAGNOSIS — C7A8 Other malignant neuroendocrine tumors: Secondary | ICD-10-CM | POA: Diagnosis not present

## 2023-04-17 DIAGNOSIS — R0600 Dyspnea, unspecified: Secondary | ICD-10-CM | POA: Diagnosis not present

## 2023-04-17 DIAGNOSIS — C786 Secondary malignant neoplasm of retroperitoneum and peritoneum: Secondary | ICD-10-CM | POA: Diagnosis not present

## 2023-04-17 DIAGNOSIS — C787 Secondary malignant neoplasm of liver and intrahepatic bile duct: Secondary | ICD-10-CM | POA: Diagnosis not present

## 2023-04-18 DIAGNOSIS — C786 Secondary malignant neoplasm of retroperitoneum and peritoneum: Secondary | ICD-10-CM | POA: Diagnosis not present

## 2023-04-18 DIAGNOSIS — C7A8 Other malignant neuroendocrine tumors: Secondary | ICD-10-CM | POA: Diagnosis not present

## 2023-04-18 DIAGNOSIS — C7880 Secondary malignant neoplasm of unspecified digestive organ: Secondary | ICD-10-CM | POA: Diagnosis not present

## 2023-04-18 DIAGNOSIS — C7951 Secondary malignant neoplasm of bone: Secondary | ICD-10-CM | POA: Diagnosis not present

## 2023-04-18 DIAGNOSIS — R0600 Dyspnea, unspecified: Secondary | ICD-10-CM | POA: Diagnosis not present

## 2023-04-18 DIAGNOSIS — C787 Secondary malignant neoplasm of liver and intrahepatic bile duct: Secondary | ICD-10-CM | POA: Diagnosis not present

## 2023-04-19 DIAGNOSIS — C7A8 Other malignant neuroendocrine tumors: Secondary | ICD-10-CM | POA: Diagnosis not present

## 2023-04-19 DIAGNOSIS — C786 Secondary malignant neoplasm of retroperitoneum and peritoneum: Secondary | ICD-10-CM | POA: Diagnosis not present

## 2023-04-19 DIAGNOSIS — R0600 Dyspnea, unspecified: Secondary | ICD-10-CM | POA: Diagnosis not present

## 2023-04-19 DIAGNOSIS — C7880 Secondary malignant neoplasm of unspecified digestive organ: Secondary | ICD-10-CM | POA: Diagnosis not present

## 2023-04-19 DIAGNOSIS — C787 Secondary malignant neoplasm of liver and intrahepatic bile duct: Secondary | ICD-10-CM | POA: Diagnosis not present

## 2023-04-19 DIAGNOSIS — C7951 Secondary malignant neoplasm of bone: Secondary | ICD-10-CM | POA: Diagnosis not present

## 2023-04-20 DIAGNOSIS — C7A8 Other malignant neuroendocrine tumors: Secondary | ICD-10-CM | POA: Diagnosis not present

## 2023-04-20 DIAGNOSIS — C7880 Secondary malignant neoplasm of unspecified digestive organ: Secondary | ICD-10-CM | POA: Diagnosis not present

## 2023-04-20 DIAGNOSIS — C787 Secondary malignant neoplasm of liver and intrahepatic bile duct: Secondary | ICD-10-CM | POA: Diagnosis not present

## 2023-04-20 DIAGNOSIS — C786 Secondary malignant neoplasm of retroperitoneum and peritoneum: Secondary | ICD-10-CM | POA: Diagnosis not present

## 2023-04-20 DIAGNOSIS — R0600 Dyspnea, unspecified: Secondary | ICD-10-CM | POA: Diagnosis not present

## 2023-04-20 DIAGNOSIS — C7951 Secondary malignant neoplasm of bone: Secondary | ICD-10-CM | POA: Diagnosis not present

## 2023-04-21 DIAGNOSIS — C786 Secondary malignant neoplasm of retroperitoneum and peritoneum: Secondary | ICD-10-CM | POA: Diagnosis not present

## 2023-04-21 DIAGNOSIS — C787 Secondary malignant neoplasm of liver and intrahepatic bile duct: Secondary | ICD-10-CM | POA: Diagnosis not present

## 2023-04-21 DIAGNOSIS — C7951 Secondary malignant neoplasm of bone: Secondary | ICD-10-CM | POA: Diagnosis not present

## 2023-04-21 DIAGNOSIS — R0600 Dyspnea, unspecified: Secondary | ICD-10-CM | POA: Diagnosis not present

## 2023-04-21 DIAGNOSIS — C7A8 Other malignant neuroendocrine tumors: Secondary | ICD-10-CM | POA: Diagnosis not present

## 2023-04-21 DIAGNOSIS — C7880 Secondary malignant neoplasm of unspecified digestive organ: Secondary | ICD-10-CM | POA: Diagnosis not present

## 2023-04-22 DIAGNOSIS — R0600 Dyspnea, unspecified: Secondary | ICD-10-CM | POA: Diagnosis not present

## 2023-04-22 DIAGNOSIS — C7A8 Other malignant neuroendocrine tumors: Secondary | ICD-10-CM | POA: Diagnosis not present

## 2023-04-22 DIAGNOSIS — C786 Secondary malignant neoplasm of retroperitoneum and peritoneum: Secondary | ICD-10-CM | POA: Diagnosis not present

## 2023-04-22 DIAGNOSIS — C787 Secondary malignant neoplasm of liver and intrahepatic bile duct: Secondary | ICD-10-CM | POA: Diagnosis not present

## 2023-04-22 DIAGNOSIS — C7880 Secondary malignant neoplasm of unspecified digestive organ: Secondary | ICD-10-CM | POA: Diagnosis not present

## 2023-04-22 DIAGNOSIS — C7951 Secondary malignant neoplasm of bone: Secondary | ICD-10-CM | POA: Diagnosis not present

## 2023-04-23 DIAGNOSIS — C786 Secondary malignant neoplasm of retroperitoneum and peritoneum: Secondary | ICD-10-CM | POA: Diagnosis not present

## 2023-04-23 DIAGNOSIS — C7951 Secondary malignant neoplasm of bone: Secondary | ICD-10-CM | POA: Diagnosis not present

## 2023-04-23 DIAGNOSIS — R0600 Dyspnea, unspecified: Secondary | ICD-10-CM | POA: Diagnosis not present

## 2023-04-23 DIAGNOSIS — C7880 Secondary malignant neoplasm of unspecified digestive organ: Secondary | ICD-10-CM | POA: Diagnosis not present

## 2023-04-23 DIAGNOSIS — C7A8 Other malignant neuroendocrine tumors: Secondary | ICD-10-CM | POA: Diagnosis not present

## 2023-04-23 DIAGNOSIS — C787 Secondary malignant neoplasm of liver and intrahepatic bile duct: Secondary | ICD-10-CM | POA: Diagnosis not present

## 2023-04-24 DIAGNOSIS — C7951 Secondary malignant neoplasm of bone: Secondary | ICD-10-CM | POA: Diagnosis not present

## 2023-04-24 DIAGNOSIS — C786 Secondary malignant neoplasm of retroperitoneum and peritoneum: Secondary | ICD-10-CM | POA: Diagnosis not present

## 2023-04-24 DIAGNOSIS — C7880 Secondary malignant neoplasm of unspecified digestive organ: Secondary | ICD-10-CM | POA: Diagnosis not present

## 2023-04-24 DIAGNOSIS — R0600 Dyspnea, unspecified: Secondary | ICD-10-CM | POA: Diagnosis not present

## 2023-04-24 DIAGNOSIS — C7A8 Other malignant neuroendocrine tumors: Secondary | ICD-10-CM | POA: Diagnosis not present

## 2023-04-24 DIAGNOSIS — C787 Secondary malignant neoplasm of liver and intrahepatic bile duct: Secondary | ICD-10-CM | POA: Diagnosis not present

## 2023-04-25 DIAGNOSIS — C7951 Secondary malignant neoplasm of bone: Secondary | ICD-10-CM | POA: Diagnosis not present

## 2023-04-25 DIAGNOSIS — C7A8 Other malignant neuroendocrine tumors: Secondary | ICD-10-CM | POA: Diagnosis not present

## 2023-04-25 DIAGNOSIS — C7880 Secondary malignant neoplasm of unspecified digestive organ: Secondary | ICD-10-CM | POA: Diagnosis not present

## 2023-04-25 DIAGNOSIS — C787 Secondary malignant neoplasm of liver and intrahepatic bile duct: Secondary | ICD-10-CM | POA: Diagnosis not present

## 2023-04-25 DIAGNOSIS — R0600 Dyspnea, unspecified: Secondary | ICD-10-CM | POA: Diagnosis not present

## 2023-04-25 DIAGNOSIS — C786 Secondary malignant neoplasm of retroperitoneum and peritoneum: Secondary | ICD-10-CM | POA: Diagnosis not present

## 2023-04-26 DIAGNOSIS — C7880 Secondary malignant neoplasm of unspecified digestive organ: Secondary | ICD-10-CM | POA: Diagnosis not present

## 2023-04-26 DIAGNOSIS — C7A8 Other malignant neuroendocrine tumors: Secondary | ICD-10-CM | POA: Diagnosis not present

## 2023-04-26 DIAGNOSIS — C786 Secondary malignant neoplasm of retroperitoneum and peritoneum: Secondary | ICD-10-CM | POA: Diagnosis not present

## 2023-04-26 DIAGNOSIS — R0600 Dyspnea, unspecified: Secondary | ICD-10-CM | POA: Diagnosis not present

## 2023-04-26 DIAGNOSIS — C787 Secondary malignant neoplasm of liver and intrahepatic bile duct: Secondary | ICD-10-CM | POA: Diagnosis not present

## 2023-04-26 DIAGNOSIS — C7951 Secondary malignant neoplasm of bone: Secondary | ICD-10-CM | POA: Diagnosis not present

## 2023-04-27 DIAGNOSIS — C7A8 Other malignant neuroendocrine tumors: Secondary | ICD-10-CM | POA: Diagnosis not present

## 2023-04-27 DIAGNOSIS — C786 Secondary malignant neoplasm of retroperitoneum and peritoneum: Secondary | ICD-10-CM | POA: Diagnosis not present

## 2023-04-27 DIAGNOSIS — C7951 Secondary malignant neoplasm of bone: Secondary | ICD-10-CM | POA: Diagnosis not present

## 2023-04-27 DIAGNOSIS — R0600 Dyspnea, unspecified: Secondary | ICD-10-CM | POA: Diagnosis not present

## 2023-04-27 DIAGNOSIS — C787 Secondary malignant neoplasm of liver and intrahepatic bile duct: Secondary | ICD-10-CM | POA: Diagnosis not present

## 2023-04-27 DIAGNOSIS — C7880 Secondary malignant neoplasm of unspecified digestive organ: Secondary | ICD-10-CM | POA: Diagnosis not present

## 2023-04-28 DIAGNOSIS — C7A8 Other malignant neuroendocrine tumors: Secondary | ICD-10-CM | POA: Diagnosis not present

## 2023-04-28 DIAGNOSIS — C7951 Secondary malignant neoplasm of bone: Secondary | ICD-10-CM | POA: Diagnosis not present

## 2023-04-28 DIAGNOSIS — C786 Secondary malignant neoplasm of retroperitoneum and peritoneum: Secondary | ICD-10-CM | POA: Diagnosis not present

## 2023-04-28 DIAGNOSIS — C787 Secondary malignant neoplasm of liver and intrahepatic bile duct: Secondary | ICD-10-CM | POA: Diagnosis not present

## 2023-04-28 DIAGNOSIS — R0600 Dyspnea, unspecified: Secondary | ICD-10-CM | POA: Diagnosis not present

## 2023-04-28 DIAGNOSIS — C7880 Secondary malignant neoplasm of unspecified digestive organ: Secondary | ICD-10-CM | POA: Diagnosis not present

## 2023-04-29 DIAGNOSIS — C7951 Secondary malignant neoplasm of bone: Secondary | ICD-10-CM | POA: Diagnosis not present

## 2023-04-29 DIAGNOSIS — C7880 Secondary malignant neoplasm of unspecified digestive organ: Secondary | ICD-10-CM | POA: Diagnosis not present

## 2023-04-29 DIAGNOSIS — C787 Secondary malignant neoplasm of liver and intrahepatic bile duct: Secondary | ICD-10-CM | POA: Diagnosis not present

## 2023-04-29 DIAGNOSIS — C786 Secondary malignant neoplasm of retroperitoneum and peritoneum: Secondary | ICD-10-CM | POA: Diagnosis not present

## 2023-04-29 DIAGNOSIS — R0600 Dyspnea, unspecified: Secondary | ICD-10-CM | POA: Diagnosis not present

## 2023-04-29 DIAGNOSIS — C7A8 Other malignant neuroendocrine tumors: Secondary | ICD-10-CM | POA: Diagnosis not present

## 2023-04-30 DIAGNOSIS — C787 Secondary malignant neoplasm of liver and intrahepatic bile duct: Secondary | ICD-10-CM | POA: Diagnosis not present

## 2023-04-30 DIAGNOSIS — C7951 Secondary malignant neoplasm of bone: Secondary | ICD-10-CM | POA: Diagnosis not present

## 2023-04-30 DIAGNOSIS — C7880 Secondary malignant neoplasm of unspecified digestive organ: Secondary | ICD-10-CM | POA: Diagnosis not present

## 2023-04-30 DIAGNOSIS — R0600 Dyspnea, unspecified: Secondary | ICD-10-CM | POA: Diagnosis not present

## 2023-04-30 DIAGNOSIS — C7A8 Other malignant neuroendocrine tumors: Secondary | ICD-10-CM | POA: Diagnosis not present

## 2023-04-30 DIAGNOSIS — C786 Secondary malignant neoplasm of retroperitoneum and peritoneum: Secondary | ICD-10-CM | POA: Diagnosis not present

## 2023-05-01 DIAGNOSIS — C7951 Secondary malignant neoplasm of bone: Secondary | ICD-10-CM | POA: Diagnosis not present

## 2023-05-01 DIAGNOSIS — R0600 Dyspnea, unspecified: Secondary | ICD-10-CM | POA: Diagnosis not present

## 2023-05-01 DIAGNOSIS — C7880 Secondary malignant neoplasm of unspecified digestive organ: Secondary | ICD-10-CM | POA: Diagnosis not present

## 2023-05-01 DIAGNOSIS — C786 Secondary malignant neoplasm of retroperitoneum and peritoneum: Secondary | ICD-10-CM | POA: Diagnosis not present

## 2023-05-01 DIAGNOSIS — C787 Secondary malignant neoplasm of liver and intrahepatic bile duct: Secondary | ICD-10-CM | POA: Diagnosis not present

## 2023-05-01 DIAGNOSIS — C7A8 Other malignant neuroendocrine tumors: Secondary | ICD-10-CM | POA: Diagnosis not present

## 2023-05-02 DIAGNOSIS — R0600 Dyspnea, unspecified: Secondary | ICD-10-CM | POA: Diagnosis not present

## 2023-05-02 DIAGNOSIS — C787 Secondary malignant neoplasm of liver and intrahepatic bile duct: Secondary | ICD-10-CM | POA: Diagnosis not present

## 2023-05-02 DIAGNOSIS — C7A8 Other malignant neuroendocrine tumors: Secondary | ICD-10-CM | POA: Diagnosis not present

## 2023-05-02 DIAGNOSIS — C7951 Secondary malignant neoplasm of bone: Secondary | ICD-10-CM | POA: Diagnosis not present

## 2023-05-02 DIAGNOSIS — C786 Secondary malignant neoplasm of retroperitoneum and peritoneum: Secondary | ICD-10-CM | POA: Diagnosis not present

## 2023-05-02 DIAGNOSIS — C7880 Secondary malignant neoplasm of unspecified digestive organ: Secondary | ICD-10-CM | POA: Diagnosis not present

## 2023-05-03 DIAGNOSIS — C7A8 Other malignant neuroendocrine tumors: Secondary | ICD-10-CM | POA: Diagnosis not present

## 2023-05-03 DIAGNOSIS — C7951 Secondary malignant neoplasm of bone: Secondary | ICD-10-CM | POA: Diagnosis not present

## 2023-05-03 DIAGNOSIS — C787 Secondary malignant neoplasm of liver and intrahepatic bile duct: Secondary | ICD-10-CM | POA: Diagnosis not present

## 2023-05-03 DIAGNOSIS — R0600 Dyspnea, unspecified: Secondary | ICD-10-CM | POA: Diagnosis not present

## 2023-05-03 DIAGNOSIS — C786 Secondary malignant neoplasm of retroperitoneum and peritoneum: Secondary | ICD-10-CM | POA: Diagnosis not present

## 2023-05-03 DIAGNOSIS — C7880 Secondary malignant neoplasm of unspecified digestive organ: Secondary | ICD-10-CM | POA: Diagnosis not present

## 2023-05-04 DIAGNOSIS — Z87891 Personal history of nicotine dependence: Secondary | ICD-10-CM | POA: Diagnosis not present

## 2023-05-04 DIAGNOSIS — C7A8 Other malignant neuroendocrine tumors: Secondary | ICD-10-CM | POA: Diagnosis not present

## 2023-05-04 DIAGNOSIS — J449 Chronic obstructive pulmonary disease, unspecified: Secondary | ICD-10-CM | POA: Diagnosis not present

## 2023-05-04 DIAGNOSIS — C786 Secondary malignant neoplasm of retroperitoneum and peritoneum: Secondary | ICD-10-CM | POA: Diagnosis not present

## 2023-05-04 DIAGNOSIS — C7951 Secondary malignant neoplasm of bone: Secondary | ICD-10-CM | POA: Diagnosis not present

## 2023-05-04 DIAGNOSIS — R1084 Generalized abdominal pain: Secondary | ICD-10-CM | POA: Diagnosis not present

## 2023-05-04 DIAGNOSIS — R0600 Dyspnea, unspecified: Secondary | ICD-10-CM | POA: Diagnosis not present

## 2023-05-04 DIAGNOSIS — F32A Depression, unspecified: Secondary | ICD-10-CM | POA: Diagnosis not present

## 2023-05-04 DIAGNOSIS — E785 Hyperlipidemia, unspecified: Secondary | ICD-10-CM | POA: Diagnosis not present

## 2023-05-04 DIAGNOSIS — C7880 Secondary malignant neoplasm of unspecified digestive organ: Secondary | ICD-10-CM | POA: Diagnosis not present

## 2023-05-04 DIAGNOSIS — N400 Enlarged prostate without lower urinary tract symptoms: Secondary | ICD-10-CM | POA: Diagnosis not present

## 2023-05-04 DIAGNOSIS — C787 Secondary malignant neoplasm of liver and intrahepatic bile duct: Secondary | ICD-10-CM | POA: Diagnosis not present

## 2023-05-09 DIAGNOSIS — C7951 Secondary malignant neoplasm of bone: Secondary | ICD-10-CM | POA: Diagnosis not present

## 2023-05-09 DIAGNOSIS — C7A8 Other malignant neuroendocrine tumors: Secondary | ICD-10-CM | POA: Diagnosis not present

## 2023-05-09 DIAGNOSIS — C7880 Secondary malignant neoplasm of unspecified digestive organ: Secondary | ICD-10-CM | POA: Diagnosis not present

## 2023-05-09 DIAGNOSIS — C787 Secondary malignant neoplasm of liver and intrahepatic bile duct: Secondary | ICD-10-CM | POA: Diagnosis not present

## 2023-05-09 DIAGNOSIS — C786 Secondary malignant neoplasm of retroperitoneum and peritoneum: Secondary | ICD-10-CM | POA: Diagnosis not present

## 2023-05-09 DIAGNOSIS — R0600 Dyspnea, unspecified: Secondary | ICD-10-CM | POA: Diagnosis not present

## 2023-05-16 DIAGNOSIS — C7951 Secondary malignant neoplasm of bone: Secondary | ICD-10-CM | POA: Diagnosis not present

## 2023-05-16 DIAGNOSIS — C7880 Secondary malignant neoplasm of unspecified digestive organ: Secondary | ICD-10-CM | POA: Diagnosis not present

## 2023-05-16 DIAGNOSIS — C7A8 Other malignant neuroendocrine tumors: Secondary | ICD-10-CM | POA: Diagnosis not present

## 2023-05-16 DIAGNOSIS — R0600 Dyspnea, unspecified: Secondary | ICD-10-CM | POA: Diagnosis not present

## 2023-05-16 DIAGNOSIS — C786 Secondary malignant neoplasm of retroperitoneum and peritoneum: Secondary | ICD-10-CM | POA: Diagnosis not present

## 2023-05-16 DIAGNOSIS — C787 Secondary malignant neoplasm of liver and intrahepatic bile duct: Secondary | ICD-10-CM | POA: Diagnosis not present

## 2023-05-23 DIAGNOSIS — C7951 Secondary malignant neoplasm of bone: Secondary | ICD-10-CM | POA: Diagnosis not present

## 2023-05-23 DIAGNOSIS — C7A8 Other malignant neuroendocrine tumors: Secondary | ICD-10-CM | POA: Diagnosis not present

## 2023-05-23 DIAGNOSIS — C786 Secondary malignant neoplasm of retroperitoneum and peritoneum: Secondary | ICD-10-CM | POA: Diagnosis not present

## 2023-05-23 DIAGNOSIS — C787 Secondary malignant neoplasm of liver and intrahepatic bile duct: Secondary | ICD-10-CM | POA: Diagnosis not present

## 2023-05-23 DIAGNOSIS — C7880 Secondary malignant neoplasm of unspecified digestive organ: Secondary | ICD-10-CM | POA: Diagnosis not present

## 2023-05-23 DIAGNOSIS — R0600 Dyspnea, unspecified: Secondary | ICD-10-CM | POA: Diagnosis not present

## 2023-05-30 DIAGNOSIS — C7951 Secondary malignant neoplasm of bone: Secondary | ICD-10-CM | POA: Diagnosis not present

## 2023-05-30 DIAGNOSIS — R0600 Dyspnea, unspecified: Secondary | ICD-10-CM | POA: Diagnosis not present

## 2023-05-30 DIAGNOSIS — C786 Secondary malignant neoplasm of retroperitoneum and peritoneum: Secondary | ICD-10-CM | POA: Diagnosis not present

## 2023-05-30 DIAGNOSIS — C7880 Secondary malignant neoplasm of unspecified digestive organ: Secondary | ICD-10-CM | POA: Diagnosis not present

## 2023-05-30 DIAGNOSIS — C7A8 Other malignant neuroendocrine tumors: Secondary | ICD-10-CM | POA: Diagnosis not present

## 2023-05-30 DIAGNOSIS — C787 Secondary malignant neoplasm of liver and intrahepatic bile duct: Secondary | ICD-10-CM | POA: Diagnosis not present

## 2023-06-03 DIAGNOSIS — C7951 Secondary malignant neoplasm of bone: Secondary | ICD-10-CM | POA: Diagnosis not present

## 2023-06-03 DIAGNOSIS — N400 Enlarged prostate without lower urinary tract symptoms: Secondary | ICD-10-CM | POA: Diagnosis not present

## 2023-06-03 DIAGNOSIS — F32A Depression, unspecified: Secondary | ICD-10-CM | POA: Diagnosis not present

## 2023-06-03 DIAGNOSIS — C787 Secondary malignant neoplasm of liver and intrahepatic bile duct: Secondary | ICD-10-CM | POA: Diagnosis not present

## 2023-06-03 DIAGNOSIS — C7880 Secondary malignant neoplasm of unspecified digestive organ: Secondary | ICD-10-CM | POA: Diagnosis not present

## 2023-06-03 DIAGNOSIS — R1084 Generalized abdominal pain: Secondary | ICD-10-CM | POA: Diagnosis not present

## 2023-06-03 DIAGNOSIS — C786 Secondary malignant neoplasm of retroperitoneum and peritoneum: Secondary | ICD-10-CM | POA: Diagnosis not present

## 2023-06-03 DIAGNOSIS — R0600 Dyspnea, unspecified: Secondary | ICD-10-CM | POA: Diagnosis not present

## 2023-06-03 DIAGNOSIS — J449 Chronic obstructive pulmonary disease, unspecified: Secondary | ICD-10-CM | POA: Diagnosis not present

## 2023-06-03 DIAGNOSIS — C7A8 Other malignant neuroendocrine tumors: Secondary | ICD-10-CM | POA: Diagnosis not present

## 2023-06-03 DIAGNOSIS — Z87891 Personal history of nicotine dependence: Secondary | ICD-10-CM | POA: Diagnosis not present

## 2023-06-03 DIAGNOSIS — E785 Hyperlipidemia, unspecified: Secondary | ICD-10-CM | POA: Diagnosis not present

## 2023-06-04 DIAGNOSIS — R0600 Dyspnea, unspecified: Secondary | ICD-10-CM | POA: Diagnosis not present

## 2023-06-04 DIAGNOSIS — C7951 Secondary malignant neoplasm of bone: Secondary | ICD-10-CM | POA: Diagnosis not present

## 2023-06-04 DIAGNOSIS — C7880 Secondary malignant neoplasm of unspecified digestive organ: Secondary | ICD-10-CM | POA: Diagnosis not present

## 2023-06-04 DIAGNOSIS — C787 Secondary malignant neoplasm of liver and intrahepatic bile duct: Secondary | ICD-10-CM | POA: Diagnosis not present

## 2023-06-04 DIAGNOSIS — C7A8 Other malignant neuroendocrine tumors: Secondary | ICD-10-CM | POA: Diagnosis not present

## 2023-06-04 DIAGNOSIS — C786 Secondary malignant neoplasm of retroperitoneum and peritoneum: Secondary | ICD-10-CM | POA: Diagnosis not present

## 2023-06-11 DIAGNOSIS — R0600 Dyspnea, unspecified: Secondary | ICD-10-CM | POA: Diagnosis not present

## 2023-06-11 DIAGNOSIS — C7951 Secondary malignant neoplasm of bone: Secondary | ICD-10-CM | POA: Diagnosis not present

## 2023-06-11 DIAGNOSIS — C787 Secondary malignant neoplasm of liver and intrahepatic bile duct: Secondary | ICD-10-CM | POA: Diagnosis not present

## 2023-06-11 DIAGNOSIS — C7A8 Other malignant neuroendocrine tumors: Secondary | ICD-10-CM | POA: Diagnosis not present

## 2023-06-11 DIAGNOSIS — C7880 Secondary malignant neoplasm of unspecified digestive organ: Secondary | ICD-10-CM | POA: Diagnosis not present

## 2023-06-11 DIAGNOSIS — C786 Secondary malignant neoplasm of retroperitoneum and peritoneum: Secondary | ICD-10-CM | POA: Diagnosis not present

## 2023-06-20 DIAGNOSIS — C7951 Secondary malignant neoplasm of bone: Secondary | ICD-10-CM | POA: Diagnosis not present

## 2023-06-20 DIAGNOSIS — C7A8 Other malignant neuroendocrine tumors: Secondary | ICD-10-CM | POA: Diagnosis not present

## 2023-06-20 DIAGNOSIS — C787 Secondary malignant neoplasm of liver and intrahepatic bile duct: Secondary | ICD-10-CM | POA: Diagnosis not present

## 2023-06-20 DIAGNOSIS — C786 Secondary malignant neoplasm of retroperitoneum and peritoneum: Secondary | ICD-10-CM | POA: Diagnosis not present

## 2023-06-20 DIAGNOSIS — R0600 Dyspnea, unspecified: Secondary | ICD-10-CM | POA: Diagnosis not present

## 2023-06-20 DIAGNOSIS — C7880 Secondary malignant neoplasm of unspecified digestive organ: Secondary | ICD-10-CM | POA: Diagnosis not present

## 2023-06-25 DIAGNOSIS — C786 Secondary malignant neoplasm of retroperitoneum and peritoneum: Secondary | ICD-10-CM | POA: Diagnosis not present

## 2023-06-25 DIAGNOSIS — R0600 Dyspnea, unspecified: Secondary | ICD-10-CM | POA: Diagnosis not present

## 2023-06-25 DIAGNOSIS — C7A8 Other malignant neuroendocrine tumors: Secondary | ICD-10-CM | POA: Diagnosis not present

## 2023-06-25 DIAGNOSIS — C7880 Secondary malignant neoplasm of unspecified digestive organ: Secondary | ICD-10-CM | POA: Diagnosis not present

## 2023-06-25 DIAGNOSIS — C7951 Secondary malignant neoplasm of bone: Secondary | ICD-10-CM | POA: Diagnosis not present

## 2023-06-25 DIAGNOSIS — C787 Secondary malignant neoplasm of liver and intrahepatic bile duct: Secondary | ICD-10-CM | POA: Diagnosis not present

## 2023-07-02 DIAGNOSIS — C7A8 Other malignant neuroendocrine tumors: Secondary | ICD-10-CM | POA: Diagnosis not present

## 2023-07-02 DIAGNOSIS — R0600 Dyspnea, unspecified: Secondary | ICD-10-CM | POA: Diagnosis not present

## 2023-07-02 DIAGNOSIS — C787 Secondary malignant neoplasm of liver and intrahepatic bile duct: Secondary | ICD-10-CM | POA: Diagnosis not present

## 2023-07-02 DIAGNOSIS — C7880 Secondary malignant neoplasm of unspecified digestive organ: Secondary | ICD-10-CM | POA: Diagnosis not present

## 2023-07-02 DIAGNOSIS — C7951 Secondary malignant neoplasm of bone: Secondary | ICD-10-CM | POA: Diagnosis not present

## 2023-07-02 DIAGNOSIS — C786 Secondary malignant neoplasm of retroperitoneum and peritoneum: Secondary | ICD-10-CM | POA: Diagnosis not present

## 2023-07-04 DIAGNOSIS — E785 Hyperlipidemia, unspecified: Secondary | ICD-10-CM | POA: Diagnosis not present

## 2023-07-04 DIAGNOSIS — J449 Chronic obstructive pulmonary disease, unspecified: Secondary | ICD-10-CM | POA: Diagnosis not present

## 2023-07-04 DIAGNOSIS — C787 Secondary malignant neoplasm of liver and intrahepatic bile duct: Secondary | ICD-10-CM | POA: Diagnosis not present

## 2023-07-04 DIAGNOSIS — C7880 Secondary malignant neoplasm of unspecified digestive organ: Secondary | ICD-10-CM | POA: Diagnosis not present

## 2023-07-04 DIAGNOSIS — C786 Secondary malignant neoplasm of retroperitoneum and peritoneum: Secondary | ICD-10-CM | POA: Diagnosis not present

## 2023-07-04 DIAGNOSIS — N400 Enlarged prostate without lower urinary tract symptoms: Secondary | ICD-10-CM | POA: Diagnosis not present

## 2023-07-04 DIAGNOSIS — C7A8 Other malignant neuroendocrine tumors: Secondary | ICD-10-CM | POA: Diagnosis not present

## 2023-07-04 DIAGNOSIS — R1084 Generalized abdominal pain: Secondary | ICD-10-CM | POA: Diagnosis not present

## 2023-07-04 DIAGNOSIS — C7951 Secondary malignant neoplasm of bone: Secondary | ICD-10-CM | POA: Diagnosis not present

## 2023-07-04 DIAGNOSIS — Z87891 Personal history of nicotine dependence: Secondary | ICD-10-CM | POA: Diagnosis not present

## 2023-07-04 DIAGNOSIS — R0600 Dyspnea, unspecified: Secondary | ICD-10-CM | POA: Diagnosis not present

## 2023-07-04 DIAGNOSIS — F32A Depression, unspecified: Secondary | ICD-10-CM | POA: Diagnosis not present

## 2023-07-09 DIAGNOSIS — C7880 Secondary malignant neoplasm of unspecified digestive organ: Secondary | ICD-10-CM | POA: Diagnosis not present

## 2023-07-09 DIAGNOSIS — C786 Secondary malignant neoplasm of retroperitoneum and peritoneum: Secondary | ICD-10-CM | POA: Diagnosis not present

## 2023-07-09 DIAGNOSIS — C7A8 Other malignant neuroendocrine tumors: Secondary | ICD-10-CM | POA: Diagnosis not present

## 2023-07-09 DIAGNOSIS — C787 Secondary malignant neoplasm of liver and intrahepatic bile duct: Secondary | ICD-10-CM | POA: Diagnosis not present

## 2023-07-09 DIAGNOSIS — C7951 Secondary malignant neoplasm of bone: Secondary | ICD-10-CM | POA: Diagnosis not present

## 2023-07-09 DIAGNOSIS — R0600 Dyspnea, unspecified: Secondary | ICD-10-CM | POA: Diagnosis not present

## 2023-07-16 DIAGNOSIS — C787 Secondary malignant neoplasm of liver and intrahepatic bile duct: Secondary | ICD-10-CM | POA: Diagnosis not present

## 2023-07-16 DIAGNOSIS — R0600 Dyspnea, unspecified: Secondary | ICD-10-CM | POA: Diagnosis not present

## 2023-07-16 DIAGNOSIS — C7A8 Other malignant neuroendocrine tumors: Secondary | ICD-10-CM | POA: Diagnosis not present

## 2023-07-16 DIAGNOSIS — C786 Secondary malignant neoplasm of retroperitoneum and peritoneum: Secondary | ICD-10-CM | POA: Diagnosis not present

## 2023-07-16 DIAGNOSIS — C7880 Secondary malignant neoplasm of unspecified digestive organ: Secondary | ICD-10-CM | POA: Diagnosis not present

## 2023-07-16 DIAGNOSIS — C7951 Secondary malignant neoplasm of bone: Secondary | ICD-10-CM | POA: Diagnosis not present

## 2023-07-24 DIAGNOSIS — C7A8 Other malignant neuroendocrine tumors: Secondary | ICD-10-CM | POA: Diagnosis not present

## 2023-07-24 DIAGNOSIS — C787 Secondary malignant neoplasm of liver and intrahepatic bile duct: Secondary | ICD-10-CM | POA: Diagnosis not present

## 2023-07-24 DIAGNOSIS — C7880 Secondary malignant neoplasm of unspecified digestive organ: Secondary | ICD-10-CM | POA: Diagnosis not present

## 2023-07-24 DIAGNOSIS — R0600 Dyspnea, unspecified: Secondary | ICD-10-CM | POA: Diagnosis not present

## 2023-07-24 DIAGNOSIS — C786 Secondary malignant neoplasm of retroperitoneum and peritoneum: Secondary | ICD-10-CM | POA: Diagnosis not present

## 2023-07-24 DIAGNOSIS — C7951 Secondary malignant neoplasm of bone: Secondary | ICD-10-CM | POA: Diagnosis not present

## 2023-07-30 DIAGNOSIS — R0600 Dyspnea, unspecified: Secondary | ICD-10-CM | POA: Diagnosis not present

## 2023-07-30 DIAGNOSIS — C7880 Secondary malignant neoplasm of unspecified digestive organ: Secondary | ICD-10-CM | POA: Diagnosis not present

## 2023-07-30 DIAGNOSIS — C7A8 Other malignant neuroendocrine tumors: Secondary | ICD-10-CM | POA: Diagnosis not present

## 2023-07-30 DIAGNOSIS — C787 Secondary malignant neoplasm of liver and intrahepatic bile duct: Secondary | ICD-10-CM | POA: Diagnosis not present

## 2023-07-30 DIAGNOSIS — C7951 Secondary malignant neoplasm of bone: Secondary | ICD-10-CM | POA: Diagnosis not present

## 2023-07-30 DIAGNOSIS — C786 Secondary malignant neoplasm of retroperitoneum and peritoneum: Secondary | ICD-10-CM | POA: Diagnosis not present

## 2023-07-31 DIAGNOSIS — R0600 Dyspnea, unspecified: Secondary | ICD-10-CM | POA: Diagnosis not present

## 2023-07-31 DIAGNOSIS — C7A8 Other malignant neuroendocrine tumors: Secondary | ICD-10-CM | POA: Diagnosis not present

## 2023-07-31 DIAGNOSIS — C7880 Secondary malignant neoplasm of unspecified digestive organ: Secondary | ICD-10-CM | POA: Diagnosis not present

## 2023-07-31 DIAGNOSIS — C787 Secondary malignant neoplasm of liver and intrahepatic bile duct: Secondary | ICD-10-CM | POA: Diagnosis not present

## 2023-07-31 DIAGNOSIS — C7951 Secondary malignant neoplasm of bone: Secondary | ICD-10-CM | POA: Diagnosis not present

## 2023-07-31 DIAGNOSIS — C786 Secondary malignant neoplasm of retroperitoneum and peritoneum: Secondary | ICD-10-CM | POA: Diagnosis not present

## 2023-08-04 DIAGNOSIS — R0600 Dyspnea, unspecified: Secondary | ICD-10-CM | POA: Diagnosis not present

## 2023-08-04 DIAGNOSIS — J449 Chronic obstructive pulmonary disease, unspecified: Secondary | ICD-10-CM | POA: Diagnosis not present

## 2023-08-04 DIAGNOSIS — R1084 Generalized abdominal pain: Secondary | ICD-10-CM | POA: Diagnosis not present

## 2023-08-04 DIAGNOSIS — C786 Secondary malignant neoplasm of retroperitoneum and peritoneum: Secondary | ICD-10-CM | POA: Diagnosis not present

## 2023-08-04 DIAGNOSIS — E785 Hyperlipidemia, unspecified: Secondary | ICD-10-CM | POA: Diagnosis not present

## 2023-08-04 DIAGNOSIS — C7951 Secondary malignant neoplasm of bone: Secondary | ICD-10-CM | POA: Diagnosis not present

## 2023-08-04 DIAGNOSIS — C787 Secondary malignant neoplasm of liver and intrahepatic bile duct: Secondary | ICD-10-CM | POA: Diagnosis not present

## 2023-08-04 DIAGNOSIS — C7880 Secondary malignant neoplasm of unspecified digestive organ: Secondary | ICD-10-CM | POA: Diagnosis not present

## 2023-08-04 DIAGNOSIS — F32A Depression, unspecified: Secondary | ICD-10-CM | POA: Diagnosis not present

## 2023-08-04 DIAGNOSIS — Z87891 Personal history of nicotine dependence: Secondary | ICD-10-CM | POA: Diagnosis not present

## 2023-08-04 DIAGNOSIS — C7A8 Other malignant neuroendocrine tumors: Secondary | ICD-10-CM | POA: Diagnosis not present

## 2023-08-04 DIAGNOSIS — N400 Enlarged prostate without lower urinary tract symptoms: Secondary | ICD-10-CM | POA: Diagnosis not present

## 2023-08-06 DIAGNOSIS — C7A8 Other malignant neuroendocrine tumors: Secondary | ICD-10-CM | POA: Diagnosis not present

## 2023-08-06 DIAGNOSIS — C786 Secondary malignant neoplasm of retroperitoneum and peritoneum: Secondary | ICD-10-CM | POA: Diagnosis not present

## 2023-08-06 DIAGNOSIS — R0600 Dyspnea, unspecified: Secondary | ICD-10-CM | POA: Diagnosis not present

## 2023-08-06 DIAGNOSIS — C7951 Secondary malignant neoplasm of bone: Secondary | ICD-10-CM | POA: Diagnosis not present

## 2023-08-06 DIAGNOSIS — C787 Secondary malignant neoplasm of liver and intrahepatic bile duct: Secondary | ICD-10-CM | POA: Diagnosis not present

## 2023-08-06 DIAGNOSIS — C7880 Secondary malignant neoplasm of unspecified digestive organ: Secondary | ICD-10-CM | POA: Diagnosis not present

## 2023-08-13 DIAGNOSIS — R0600 Dyspnea, unspecified: Secondary | ICD-10-CM | POA: Diagnosis not present

## 2023-08-13 DIAGNOSIS — C7880 Secondary malignant neoplasm of unspecified digestive organ: Secondary | ICD-10-CM | POA: Diagnosis not present

## 2023-08-13 DIAGNOSIS — C787 Secondary malignant neoplasm of liver and intrahepatic bile duct: Secondary | ICD-10-CM | POA: Diagnosis not present

## 2023-08-13 DIAGNOSIS — C7A8 Other malignant neuroendocrine tumors: Secondary | ICD-10-CM | POA: Diagnosis not present

## 2023-08-13 DIAGNOSIS — C7951 Secondary malignant neoplasm of bone: Secondary | ICD-10-CM | POA: Diagnosis not present

## 2023-08-13 DIAGNOSIS — C786 Secondary malignant neoplasm of retroperitoneum and peritoneum: Secondary | ICD-10-CM | POA: Diagnosis not present

## 2023-08-20 DIAGNOSIS — C787 Secondary malignant neoplasm of liver and intrahepatic bile duct: Secondary | ICD-10-CM | POA: Diagnosis not present

## 2023-08-20 DIAGNOSIS — C7A8 Other malignant neuroendocrine tumors: Secondary | ICD-10-CM | POA: Diagnosis not present

## 2023-08-20 DIAGNOSIS — C786 Secondary malignant neoplasm of retroperitoneum and peritoneum: Secondary | ICD-10-CM | POA: Diagnosis not present

## 2023-08-20 DIAGNOSIS — R0600 Dyspnea, unspecified: Secondary | ICD-10-CM | POA: Diagnosis not present

## 2023-08-20 DIAGNOSIS — C7951 Secondary malignant neoplasm of bone: Secondary | ICD-10-CM | POA: Diagnosis not present

## 2023-08-20 DIAGNOSIS — C7880 Secondary malignant neoplasm of unspecified digestive organ: Secondary | ICD-10-CM | POA: Diagnosis not present

## 2023-08-25 DIAGNOSIS — C7951 Secondary malignant neoplasm of bone: Secondary | ICD-10-CM | POA: Diagnosis not present

## 2023-08-25 DIAGNOSIS — R0600 Dyspnea, unspecified: Secondary | ICD-10-CM | POA: Diagnosis not present

## 2023-08-25 DIAGNOSIS — C7A8 Other malignant neuroendocrine tumors: Secondary | ICD-10-CM | POA: Diagnosis not present

## 2023-08-25 DIAGNOSIS — C787 Secondary malignant neoplasm of liver and intrahepatic bile duct: Secondary | ICD-10-CM | POA: Diagnosis not present

## 2023-08-25 DIAGNOSIS — C7880 Secondary malignant neoplasm of unspecified digestive organ: Secondary | ICD-10-CM | POA: Diagnosis not present

## 2023-08-25 DIAGNOSIS — C786 Secondary malignant neoplasm of retroperitoneum and peritoneum: Secondary | ICD-10-CM | POA: Diagnosis not present

## 2023-08-27 DIAGNOSIS — C7951 Secondary malignant neoplasm of bone: Secondary | ICD-10-CM | POA: Diagnosis not present

## 2023-08-27 DIAGNOSIS — C7A8 Other malignant neuroendocrine tumors: Secondary | ICD-10-CM | POA: Diagnosis not present

## 2023-08-27 DIAGNOSIS — C787 Secondary malignant neoplasm of liver and intrahepatic bile duct: Secondary | ICD-10-CM | POA: Diagnosis not present

## 2023-08-27 DIAGNOSIS — R0600 Dyspnea, unspecified: Secondary | ICD-10-CM | POA: Diagnosis not present

## 2023-08-27 DIAGNOSIS — C786 Secondary malignant neoplasm of retroperitoneum and peritoneum: Secondary | ICD-10-CM | POA: Diagnosis not present

## 2023-08-27 DIAGNOSIS — C7880 Secondary malignant neoplasm of unspecified digestive organ: Secondary | ICD-10-CM | POA: Diagnosis not present

## 2023-09-01 DIAGNOSIS — E785 Hyperlipidemia, unspecified: Secondary | ICD-10-CM | POA: Diagnosis not present

## 2023-09-01 DIAGNOSIS — R1084 Generalized abdominal pain: Secondary | ICD-10-CM | POA: Diagnosis not present

## 2023-09-01 DIAGNOSIS — J449 Chronic obstructive pulmonary disease, unspecified: Secondary | ICD-10-CM | POA: Diagnosis not present

## 2023-09-01 DIAGNOSIS — C786 Secondary malignant neoplasm of retroperitoneum and peritoneum: Secondary | ICD-10-CM | POA: Diagnosis not present

## 2023-09-01 DIAGNOSIS — Z87891 Personal history of nicotine dependence: Secondary | ICD-10-CM | POA: Diagnosis not present

## 2023-09-01 DIAGNOSIS — C787 Secondary malignant neoplasm of liver and intrahepatic bile duct: Secondary | ICD-10-CM | POA: Diagnosis not present

## 2023-09-01 DIAGNOSIS — N400 Enlarged prostate without lower urinary tract symptoms: Secondary | ICD-10-CM | POA: Diagnosis not present

## 2023-09-01 DIAGNOSIS — C7880 Secondary malignant neoplasm of unspecified digestive organ: Secondary | ICD-10-CM | POA: Diagnosis not present

## 2023-09-01 DIAGNOSIS — F32A Depression, unspecified: Secondary | ICD-10-CM | POA: Diagnosis not present

## 2023-09-01 DIAGNOSIS — C7951 Secondary malignant neoplasm of bone: Secondary | ICD-10-CM | POA: Diagnosis not present

## 2023-09-01 DIAGNOSIS — R0600 Dyspnea, unspecified: Secondary | ICD-10-CM | POA: Diagnosis not present

## 2023-09-01 DIAGNOSIS — C7A8 Other malignant neuroendocrine tumors: Secondary | ICD-10-CM | POA: Diagnosis not present

## 2023-09-03 DIAGNOSIS — C787 Secondary malignant neoplasm of liver and intrahepatic bile duct: Secondary | ICD-10-CM | POA: Diagnosis not present

## 2023-09-03 DIAGNOSIS — C7951 Secondary malignant neoplasm of bone: Secondary | ICD-10-CM | POA: Diagnosis not present

## 2023-09-03 DIAGNOSIS — C786 Secondary malignant neoplasm of retroperitoneum and peritoneum: Secondary | ICD-10-CM | POA: Diagnosis not present

## 2023-09-03 DIAGNOSIS — C7880 Secondary malignant neoplasm of unspecified digestive organ: Secondary | ICD-10-CM | POA: Diagnosis not present

## 2023-09-03 DIAGNOSIS — C7A8 Other malignant neuroendocrine tumors: Secondary | ICD-10-CM | POA: Diagnosis not present

## 2023-09-03 DIAGNOSIS — R0600 Dyspnea, unspecified: Secondary | ICD-10-CM | POA: Diagnosis not present

## 2023-09-10 DIAGNOSIS — C786 Secondary malignant neoplasm of retroperitoneum and peritoneum: Secondary | ICD-10-CM | POA: Diagnosis not present

## 2023-09-10 DIAGNOSIS — R0600 Dyspnea, unspecified: Secondary | ICD-10-CM | POA: Diagnosis not present

## 2023-09-10 DIAGNOSIS — C787 Secondary malignant neoplasm of liver and intrahepatic bile duct: Secondary | ICD-10-CM | POA: Diagnosis not present

## 2023-09-10 DIAGNOSIS — C7880 Secondary malignant neoplasm of unspecified digestive organ: Secondary | ICD-10-CM | POA: Diagnosis not present

## 2023-09-10 DIAGNOSIS — C7951 Secondary malignant neoplasm of bone: Secondary | ICD-10-CM | POA: Diagnosis not present

## 2023-09-10 DIAGNOSIS — C7A8 Other malignant neuroendocrine tumors: Secondary | ICD-10-CM | POA: Diagnosis not present

## 2023-09-17 DIAGNOSIS — C7880 Secondary malignant neoplasm of unspecified digestive organ: Secondary | ICD-10-CM | POA: Diagnosis not present

## 2023-09-17 DIAGNOSIS — R0600 Dyspnea, unspecified: Secondary | ICD-10-CM | POA: Diagnosis not present

## 2023-09-17 DIAGNOSIS — C787 Secondary malignant neoplasm of liver and intrahepatic bile duct: Secondary | ICD-10-CM | POA: Diagnosis not present

## 2023-09-17 DIAGNOSIS — C7A8 Other malignant neuroendocrine tumors: Secondary | ICD-10-CM | POA: Diagnosis not present

## 2023-09-17 DIAGNOSIS — C7951 Secondary malignant neoplasm of bone: Secondary | ICD-10-CM | POA: Diagnosis not present

## 2023-09-17 DIAGNOSIS — C786 Secondary malignant neoplasm of retroperitoneum and peritoneum: Secondary | ICD-10-CM | POA: Diagnosis not present

## 2023-09-24 DIAGNOSIS — C7880 Secondary malignant neoplasm of unspecified digestive organ: Secondary | ICD-10-CM | POA: Diagnosis not present

## 2023-09-24 DIAGNOSIS — R0600 Dyspnea, unspecified: Secondary | ICD-10-CM | POA: Diagnosis not present

## 2023-09-24 DIAGNOSIS — C7951 Secondary malignant neoplasm of bone: Secondary | ICD-10-CM | POA: Diagnosis not present

## 2023-09-24 DIAGNOSIS — C787 Secondary malignant neoplasm of liver and intrahepatic bile duct: Secondary | ICD-10-CM | POA: Diagnosis not present

## 2023-09-24 DIAGNOSIS — C786 Secondary malignant neoplasm of retroperitoneum and peritoneum: Secondary | ICD-10-CM | POA: Diagnosis not present

## 2023-09-24 DIAGNOSIS — C7A8 Other malignant neuroendocrine tumors: Secondary | ICD-10-CM | POA: Diagnosis not present

## 2023-09-27 DIAGNOSIS — C7A8 Other malignant neuroendocrine tumors: Secondary | ICD-10-CM | POA: Diagnosis not present

## 2023-09-27 DIAGNOSIS — R0600 Dyspnea, unspecified: Secondary | ICD-10-CM | POA: Diagnosis not present

## 2023-09-27 DIAGNOSIS — C786 Secondary malignant neoplasm of retroperitoneum and peritoneum: Secondary | ICD-10-CM | POA: Diagnosis not present

## 2023-09-27 DIAGNOSIS — C7951 Secondary malignant neoplasm of bone: Secondary | ICD-10-CM | POA: Diagnosis not present

## 2023-09-27 DIAGNOSIS — C7880 Secondary malignant neoplasm of unspecified digestive organ: Secondary | ICD-10-CM | POA: Diagnosis not present

## 2023-09-27 DIAGNOSIS — C787 Secondary malignant neoplasm of liver and intrahepatic bile duct: Secondary | ICD-10-CM | POA: Diagnosis not present

## 2023-10-01 DIAGNOSIS — C787 Secondary malignant neoplasm of liver and intrahepatic bile duct: Secondary | ICD-10-CM | POA: Diagnosis not present

## 2023-10-01 DIAGNOSIS — C786 Secondary malignant neoplasm of retroperitoneum and peritoneum: Secondary | ICD-10-CM | POA: Diagnosis not present

## 2023-10-01 DIAGNOSIS — R0600 Dyspnea, unspecified: Secondary | ICD-10-CM | POA: Diagnosis not present

## 2023-10-01 DIAGNOSIS — C7951 Secondary malignant neoplasm of bone: Secondary | ICD-10-CM | POA: Diagnosis not present

## 2023-10-01 DIAGNOSIS — C7A8 Other malignant neuroendocrine tumors: Secondary | ICD-10-CM | POA: Diagnosis not present

## 2023-10-01 DIAGNOSIS — C7880 Secondary malignant neoplasm of unspecified digestive organ: Secondary | ICD-10-CM | POA: Diagnosis not present

## 2023-10-02 DIAGNOSIS — R0600 Dyspnea, unspecified: Secondary | ICD-10-CM | POA: Diagnosis not present

## 2023-10-02 DIAGNOSIS — E785 Hyperlipidemia, unspecified: Secondary | ICD-10-CM | POA: Diagnosis not present

## 2023-10-02 DIAGNOSIS — C7880 Secondary malignant neoplasm of unspecified digestive organ: Secondary | ICD-10-CM | POA: Diagnosis not present

## 2023-10-02 DIAGNOSIS — C786 Secondary malignant neoplasm of retroperitoneum and peritoneum: Secondary | ICD-10-CM | POA: Diagnosis not present

## 2023-10-02 DIAGNOSIS — R1084 Generalized abdominal pain: Secondary | ICD-10-CM | POA: Diagnosis not present

## 2023-10-02 DIAGNOSIS — Z87891 Personal history of nicotine dependence: Secondary | ICD-10-CM | POA: Diagnosis not present

## 2023-10-02 DIAGNOSIS — C787 Secondary malignant neoplasm of liver and intrahepatic bile duct: Secondary | ICD-10-CM | POA: Diagnosis not present

## 2023-10-02 DIAGNOSIS — J449 Chronic obstructive pulmonary disease, unspecified: Secondary | ICD-10-CM | POA: Diagnosis not present

## 2023-10-02 DIAGNOSIS — F32A Depression, unspecified: Secondary | ICD-10-CM | POA: Diagnosis not present

## 2023-10-02 DIAGNOSIS — C7951 Secondary malignant neoplasm of bone: Secondary | ICD-10-CM | POA: Diagnosis not present

## 2023-10-02 DIAGNOSIS — C7A8 Other malignant neuroendocrine tumors: Secondary | ICD-10-CM | POA: Diagnosis not present

## 2023-10-02 DIAGNOSIS — N400 Enlarged prostate without lower urinary tract symptoms: Secondary | ICD-10-CM | POA: Diagnosis not present

## 2023-10-08 DIAGNOSIS — C7A8 Other malignant neuroendocrine tumors: Secondary | ICD-10-CM | POA: Diagnosis not present

## 2023-10-08 DIAGNOSIS — C7880 Secondary malignant neoplasm of unspecified digestive organ: Secondary | ICD-10-CM | POA: Diagnosis not present

## 2023-10-08 DIAGNOSIS — C786 Secondary malignant neoplasm of retroperitoneum and peritoneum: Secondary | ICD-10-CM | POA: Diagnosis not present

## 2023-10-08 DIAGNOSIS — C7951 Secondary malignant neoplasm of bone: Secondary | ICD-10-CM | POA: Diagnosis not present

## 2023-10-08 DIAGNOSIS — C787 Secondary malignant neoplasm of liver and intrahepatic bile duct: Secondary | ICD-10-CM | POA: Diagnosis not present

## 2023-10-08 DIAGNOSIS — R0600 Dyspnea, unspecified: Secondary | ICD-10-CM | POA: Diagnosis not present

## 2023-10-15 DIAGNOSIS — C7951 Secondary malignant neoplasm of bone: Secondary | ICD-10-CM | POA: Diagnosis not present

## 2023-10-15 DIAGNOSIS — C787 Secondary malignant neoplasm of liver and intrahepatic bile duct: Secondary | ICD-10-CM | POA: Diagnosis not present

## 2023-10-15 DIAGNOSIS — C7880 Secondary malignant neoplasm of unspecified digestive organ: Secondary | ICD-10-CM | POA: Diagnosis not present

## 2023-10-15 DIAGNOSIS — C786 Secondary malignant neoplasm of retroperitoneum and peritoneum: Secondary | ICD-10-CM | POA: Diagnosis not present

## 2023-10-15 DIAGNOSIS — C7A8 Other malignant neuroendocrine tumors: Secondary | ICD-10-CM | POA: Diagnosis not present

## 2023-10-15 DIAGNOSIS — R0600 Dyspnea, unspecified: Secondary | ICD-10-CM | POA: Diagnosis not present

## 2023-10-22 DIAGNOSIS — C7880 Secondary malignant neoplasm of unspecified digestive organ: Secondary | ICD-10-CM | POA: Diagnosis not present

## 2023-10-22 DIAGNOSIS — C786 Secondary malignant neoplasm of retroperitoneum and peritoneum: Secondary | ICD-10-CM | POA: Diagnosis not present

## 2023-10-22 DIAGNOSIS — C7951 Secondary malignant neoplasm of bone: Secondary | ICD-10-CM | POA: Diagnosis not present

## 2023-10-22 DIAGNOSIS — C7A8 Other malignant neuroendocrine tumors: Secondary | ICD-10-CM | POA: Diagnosis not present

## 2023-10-22 DIAGNOSIS — C787 Secondary malignant neoplasm of liver and intrahepatic bile duct: Secondary | ICD-10-CM | POA: Diagnosis not present

## 2023-10-22 DIAGNOSIS — R0600 Dyspnea, unspecified: Secondary | ICD-10-CM | POA: Diagnosis not present

## 2023-10-29 DIAGNOSIS — C7880 Secondary malignant neoplasm of unspecified digestive organ: Secondary | ICD-10-CM | POA: Diagnosis not present

## 2023-10-29 DIAGNOSIS — R0600 Dyspnea, unspecified: Secondary | ICD-10-CM | POA: Diagnosis not present

## 2023-10-29 DIAGNOSIS — C787 Secondary malignant neoplasm of liver and intrahepatic bile duct: Secondary | ICD-10-CM | POA: Diagnosis not present

## 2023-10-29 DIAGNOSIS — C786 Secondary malignant neoplasm of retroperitoneum and peritoneum: Secondary | ICD-10-CM | POA: Diagnosis not present

## 2023-10-29 DIAGNOSIS — C7A8 Other malignant neuroendocrine tumors: Secondary | ICD-10-CM | POA: Diagnosis not present

## 2023-10-29 DIAGNOSIS — C7951 Secondary malignant neoplasm of bone: Secondary | ICD-10-CM | POA: Diagnosis not present

## 2023-11-01 DIAGNOSIS — E785 Hyperlipidemia, unspecified: Secondary | ICD-10-CM | POA: Diagnosis not present

## 2023-11-01 DIAGNOSIS — C7A8 Other malignant neuroendocrine tumors: Secondary | ICD-10-CM | POA: Diagnosis not present

## 2023-11-01 DIAGNOSIS — C7880 Secondary malignant neoplasm of unspecified digestive organ: Secondary | ICD-10-CM | POA: Diagnosis not present

## 2023-11-01 DIAGNOSIS — C786 Secondary malignant neoplasm of retroperitoneum and peritoneum: Secondary | ICD-10-CM | POA: Diagnosis not present

## 2023-11-01 DIAGNOSIS — R0600 Dyspnea, unspecified: Secondary | ICD-10-CM | POA: Diagnosis not present

## 2023-11-01 DIAGNOSIS — J449 Chronic obstructive pulmonary disease, unspecified: Secondary | ICD-10-CM | POA: Diagnosis not present

## 2023-11-01 DIAGNOSIS — C7951 Secondary malignant neoplasm of bone: Secondary | ICD-10-CM | POA: Diagnosis not present

## 2023-11-01 DIAGNOSIS — N400 Enlarged prostate without lower urinary tract symptoms: Secondary | ICD-10-CM | POA: Diagnosis not present

## 2023-11-01 DIAGNOSIS — C787 Secondary malignant neoplasm of liver and intrahepatic bile duct: Secondary | ICD-10-CM | POA: Diagnosis not present

## 2023-11-01 DIAGNOSIS — Z87891 Personal history of nicotine dependence: Secondary | ICD-10-CM | POA: Diagnosis not present

## 2023-11-01 DIAGNOSIS — R1084 Generalized abdominal pain: Secondary | ICD-10-CM | POA: Diagnosis not present

## 2023-11-01 DIAGNOSIS — F32A Depression, unspecified: Secondary | ICD-10-CM | POA: Diagnosis not present

## 2023-11-05 DIAGNOSIS — C786 Secondary malignant neoplasm of retroperitoneum and peritoneum: Secondary | ICD-10-CM | POA: Diagnosis not present

## 2023-11-05 DIAGNOSIS — C787 Secondary malignant neoplasm of liver and intrahepatic bile duct: Secondary | ICD-10-CM | POA: Diagnosis not present

## 2023-11-05 DIAGNOSIS — C7951 Secondary malignant neoplasm of bone: Secondary | ICD-10-CM | POA: Diagnosis not present

## 2023-11-05 DIAGNOSIS — C7A8 Other malignant neuroendocrine tumors: Secondary | ICD-10-CM | POA: Diagnosis not present

## 2023-11-05 DIAGNOSIS — C7880 Secondary malignant neoplasm of unspecified digestive organ: Secondary | ICD-10-CM | POA: Diagnosis not present

## 2023-11-05 DIAGNOSIS — R0600 Dyspnea, unspecified: Secondary | ICD-10-CM | POA: Diagnosis not present

## 2023-11-12 DIAGNOSIS — R0600 Dyspnea, unspecified: Secondary | ICD-10-CM | POA: Diagnosis not present

## 2023-11-12 DIAGNOSIS — C7951 Secondary malignant neoplasm of bone: Secondary | ICD-10-CM | POA: Diagnosis not present

## 2023-11-12 DIAGNOSIS — C787 Secondary malignant neoplasm of liver and intrahepatic bile duct: Secondary | ICD-10-CM | POA: Diagnosis not present

## 2023-11-12 DIAGNOSIS — C7880 Secondary malignant neoplasm of unspecified digestive organ: Secondary | ICD-10-CM | POA: Diagnosis not present

## 2023-11-12 DIAGNOSIS — C786 Secondary malignant neoplasm of retroperitoneum and peritoneum: Secondary | ICD-10-CM | POA: Diagnosis not present

## 2023-11-12 DIAGNOSIS — C7A8 Other malignant neuroendocrine tumors: Secondary | ICD-10-CM | POA: Diagnosis not present

## 2023-11-19 DIAGNOSIS — R0600 Dyspnea, unspecified: Secondary | ICD-10-CM | POA: Diagnosis not present

## 2023-11-19 DIAGNOSIS — C787 Secondary malignant neoplasm of liver and intrahepatic bile duct: Secondary | ICD-10-CM | POA: Diagnosis not present

## 2023-11-19 DIAGNOSIS — C7A8 Other malignant neuroendocrine tumors: Secondary | ICD-10-CM | POA: Diagnosis not present

## 2023-11-19 DIAGNOSIS — C7951 Secondary malignant neoplasm of bone: Secondary | ICD-10-CM | POA: Diagnosis not present

## 2023-11-19 DIAGNOSIS — C7880 Secondary malignant neoplasm of unspecified digestive organ: Secondary | ICD-10-CM | POA: Diagnosis not present

## 2023-11-19 DIAGNOSIS — C786 Secondary malignant neoplasm of retroperitoneum and peritoneum: Secondary | ICD-10-CM | POA: Diagnosis not present

## 2023-11-28 DIAGNOSIS — C786 Secondary malignant neoplasm of retroperitoneum and peritoneum: Secondary | ICD-10-CM | POA: Diagnosis not present

## 2023-11-28 DIAGNOSIS — C7951 Secondary malignant neoplasm of bone: Secondary | ICD-10-CM | POA: Diagnosis not present

## 2023-11-28 DIAGNOSIS — R0600 Dyspnea, unspecified: Secondary | ICD-10-CM | POA: Diagnosis not present

## 2023-11-28 DIAGNOSIS — C7A8 Other malignant neuroendocrine tumors: Secondary | ICD-10-CM | POA: Diagnosis not present

## 2023-11-28 DIAGNOSIS — C7880 Secondary malignant neoplasm of unspecified digestive organ: Secondary | ICD-10-CM | POA: Diagnosis not present

## 2023-11-28 DIAGNOSIS — C787 Secondary malignant neoplasm of liver and intrahepatic bile duct: Secondary | ICD-10-CM | POA: Diagnosis not present

## 2023-11-29 DIAGNOSIS — C786 Secondary malignant neoplasm of retroperitoneum and peritoneum: Secondary | ICD-10-CM | POA: Diagnosis not present

## 2023-11-29 DIAGNOSIS — C787 Secondary malignant neoplasm of liver and intrahepatic bile duct: Secondary | ICD-10-CM | POA: Diagnosis not present

## 2023-11-29 DIAGNOSIS — C7880 Secondary malignant neoplasm of unspecified digestive organ: Secondary | ICD-10-CM | POA: Diagnosis not present

## 2023-11-29 DIAGNOSIS — C7951 Secondary malignant neoplasm of bone: Secondary | ICD-10-CM | POA: Diagnosis not present

## 2023-11-29 DIAGNOSIS — C7A8 Other malignant neuroendocrine tumors: Secondary | ICD-10-CM | POA: Diagnosis not present

## 2023-11-29 DIAGNOSIS — R0600 Dyspnea, unspecified: Secondary | ICD-10-CM | POA: Diagnosis not present

## 2023-12-02 DIAGNOSIS — C787 Secondary malignant neoplasm of liver and intrahepatic bile duct: Secondary | ICD-10-CM | POA: Diagnosis not present

## 2023-12-02 DIAGNOSIS — Z87891 Personal history of nicotine dependence: Secondary | ICD-10-CM | POA: Diagnosis not present

## 2023-12-02 DIAGNOSIS — R0600 Dyspnea, unspecified: Secondary | ICD-10-CM | POA: Diagnosis not present

## 2023-12-02 DIAGNOSIS — J449 Chronic obstructive pulmonary disease, unspecified: Secondary | ICD-10-CM | POA: Diagnosis not present

## 2023-12-02 DIAGNOSIS — R1084 Generalized abdominal pain: Secondary | ICD-10-CM | POA: Diagnosis not present

## 2023-12-02 DIAGNOSIS — N400 Enlarged prostate without lower urinary tract symptoms: Secondary | ICD-10-CM | POA: Diagnosis not present

## 2023-12-02 DIAGNOSIS — C7880 Secondary malignant neoplasm of unspecified digestive organ: Secondary | ICD-10-CM | POA: Diagnosis not present

## 2023-12-02 DIAGNOSIS — C7951 Secondary malignant neoplasm of bone: Secondary | ICD-10-CM | POA: Diagnosis not present

## 2023-12-02 DIAGNOSIS — E785 Hyperlipidemia, unspecified: Secondary | ICD-10-CM | POA: Diagnosis not present

## 2023-12-02 DIAGNOSIS — F32A Depression, unspecified: Secondary | ICD-10-CM | POA: Diagnosis not present

## 2023-12-02 DIAGNOSIS — C7A8 Other malignant neuroendocrine tumors: Secondary | ICD-10-CM | POA: Diagnosis not present

## 2023-12-02 DIAGNOSIS — C786 Secondary malignant neoplasm of retroperitoneum and peritoneum: Secondary | ICD-10-CM | POA: Diagnosis not present

## 2023-12-03 DIAGNOSIS — C7A8 Other malignant neuroendocrine tumors: Secondary | ICD-10-CM | POA: Diagnosis not present

## 2023-12-03 DIAGNOSIS — C7880 Secondary malignant neoplasm of unspecified digestive organ: Secondary | ICD-10-CM | POA: Diagnosis not present

## 2023-12-03 DIAGNOSIS — C787 Secondary malignant neoplasm of liver and intrahepatic bile duct: Secondary | ICD-10-CM | POA: Diagnosis not present

## 2023-12-03 DIAGNOSIS — R0600 Dyspnea, unspecified: Secondary | ICD-10-CM | POA: Diagnosis not present

## 2023-12-03 DIAGNOSIS — C786 Secondary malignant neoplasm of retroperitoneum and peritoneum: Secondary | ICD-10-CM | POA: Diagnosis not present

## 2023-12-03 DIAGNOSIS — C7951 Secondary malignant neoplasm of bone: Secondary | ICD-10-CM | POA: Diagnosis not present

## 2023-12-10 DIAGNOSIS — C787 Secondary malignant neoplasm of liver and intrahepatic bile duct: Secondary | ICD-10-CM | POA: Diagnosis not present

## 2023-12-10 DIAGNOSIS — C786 Secondary malignant neoplasm of retroperitoneum and peritoneum: Secondary | ICD-10-CM | POA: Diagnosis not present

## 2023-12-10 DIAGNOSIS — R0600 Dyspnea, unspecified: Secondary | ICD-10-CM | POA: Diagnosis not present

## 2023-12-10 DIAGNOSIS — C7951 Secondary malignant neoplasm of bone: Secondary | ICD-10-CM | POA: Diagnosis not present

## 2023-12-10 DIAGNOSIS — C7880 Secondary malignant neoplasm of unspecified digestive organ: Secondary | ICD-10-CM | POA: Diagnosis not present

## 2023-12-10 DIAGNOSIS — C7A8 Other malignant neuroendocrine tumors: Secondary | ICD-10-CM | POA: Diagnosis not present

## 2023-12-17 DIAGNOSIS — C787 Secondary malignant neoplasm of liver and intrahepatic bile duct: Secondary | ICD-10-CM | POA: Diagnosis not present

## 2023-12-17 DIAGNOSIS — R0600 Dyspnea, unspecified: Secondary | ICD-10-CM | POA: Diagnosis not present

## 2023-12-17 DIAGNOSIS — C7A8 Other malignant neuroendocrine tumors: Secondary | ICD-10-CM | POA: Diagnosis not present

## 2023-12-17 DIAGNOSIS — C7951 Secondary malignant neoplasm of bone: Secondary | ICD-10-CM | POA: Diagnosis not present

## 2023-12-17 DIAGNOSIS — C7880 Secondary malignant neoplasm of unspecified digestive organ: Secondary | ICD-10-CM | POA: Diagnosis not present

## 2023-12-17 DIAGNOSIS — C786 Secondary malignant neoplasm of retroperitoneum and peritoneum: Secondary | ICD-10-CM | POA: Diagnosis not present

## 2023-12-24 DIAGNOSIS — C7880 Secondary malignant neoplasm of unspecified digestive organ: Secondary | ICD-10-CM | POA: Diagnosis not present

## 2023-12-24 DIAGNOSIS — C7951 Secondary malignant neoplasm of bone: Secondary | ICD-10-CM | POA: Diagnosis not present

## 2023-12-24 DIAGNOSIS — R0600 Dyspnea, unspecified: Secondary | ICD-10-CM | POA: Diagnosis not present

## 2023-12-24 DIAGNOSIS — C7A8 Other malignant neuroendocrine tumors: Secondary | ICD-10-CM | POA: Diagnosis not present

## 2023-12-24 DIAGNOSIS — C787 Secondary malignant neoplasm of liver and intrahepatic bile duct: Secondary | ICD-10-CM | POA: Diagnosis not present

## 2023-12-24 DIAGNOSIS — C786 Secondary malignant neoplasm of retroperitoneum and peritoneum: Secondary | ICD-10-CM | POA: Diagnosis not present

## 2023-12-31 DIAGNOSIS — C787 Secondary malignant neoplasm of liver and intrahepatic bile duct: Secondary | ICD-10-CM | POA: Diagnosis not present

## 2023-12-31 DIAGNOSIS — C7880 Secondary malignant neoplasm of unspecified digestive organ: Secondary | ICD-10-CM | POA: Diagnosis not present

## 2023-12-31 DIAGNOSIS — C7951 Secondary malignant neoplasm of bone: Secondary | ICD-10-CM | POA: Diagnosis not present

## 2023-12-31 DIAGNOSIS — R0600 Dyspnea, unspecified: Secondary | ICD-10-CM | POA: Diagnosis not present

## 2023-12-31 DIAGNOSIS — C7A8 Other malignant neuroendocrine tumors: Secondary | ICD-10-CM | POA: Diagnosis not present

## 2023-12-31 DIAGNOSIS — C786 Secondary malignant neoplasm of retroperitoneum and peritoneum: Secondary | ICD-10-CM | POA: Diagnosis not present

## 2024-01-01 DIAGNOSIS — C7951 Secondary malignant neoplasm of bone: Secondary | ICD-10-CM | POA: Diagnosis not present

## 2024-01-01 DIAGNOSIS — C7A8 Other malignant neuroendocrine tumors: Secondary | ICD-10-CM | POA: Diagnosis not present

## 2024-01-01 DIAGNOSIS — E785 Hyperlipidemia, unspecified: Secondary | ICD-10-CM | POA: Diagnosis not present

## 2024-01-01 DIAGNOSIS — F32A Depression, unspecified: Secondary | ICD-10-CM | POA: Diagnosis not present

## 2024-01-01 DIAGNOSIS — C7880 Secondary malignant neoplasm of unspecified digestive organ: Secondary | ICD-10-CM | POA: Diagnosis not present

## 2024-01-01 DIAGNOSIS — Z87891 Personal history of nicotine dependence: Secondary | ICD-10-CM | POA: Diagnosis not present

## 2024-01-01 DIAGNOSIS — R1084 Generalized abdominal pain: Secondary | ICD-10-CM | POA: Diagnosis not present

## 2024-01-01 DIAGNOSIS — R0600 Dyspnea, unspecified: Secondary | ICD-10-CM | POA: Diagnosis not present

## 2024-01-01 DIAGNOSIS — J449 Chronic obstructive pulmonary disease, unspecified: Secondary | ICD-10-CM | POA: Diagnosis not present

## 2024-01-01 DIAGNOSIS — C786 Secondary malignant neoplasm of retroperitoneum and peritoneum: Secondary | ICD-10-CM | POA: Diagnosis not present

## 2024-01-01 DIAGNOSIS — C787 Secondary malignant neoplasm of liver and intrahepatic bile duct: Secondary | ICD-10-CM | POA: Diagnosis not present

## 2024-01-01 DIAGNOSIS — N400 Enlarged prostate without lower urinary tract symptoms: Secondary | ICD-10-CM | POA: Diagnosis not present

## 2024-01-07 DIAGNOSIS — C7A8 Other malignant neuroendocrine tumors: Secondary | ICD-10-CM | POA: Diagnosis not present

## 2024-01-07 DIAGNOSIS — C7951 Secondary malignant neoplasm of bone: Secondary | ICD-10-CM | POA: Diagnosis not present

## 2024-01-07 DIAGNOSIS — C7880 Secondary malignant neoplasm of unspecified digestive organ: Secondary | ICD-10-CM | POA: Diagnosis not present

## 2024-01-07 DIAGNOSIS — C786 Secondary malignant neoplasm of retroperitoneum and peritoneum: Secondary | ICD-10-CM | POA: Diagnosis not present

## 2024-01-07 DIAGNOSIS — C787 Secondary malignant neoplasm of liver and intrahepatic bile duct: Secondary | ICD-10-CM | POA: Diagnosis not present

## 2024-01-07 DIAGNOSIS — R0600 Dyspnea, unspecified: Secondary | ICD-10-CM | POA: Diagnosis not present

## 2024-01-14 DIAGNOSIS — C7880 Secondary malignant neoplasm of unspecified digestive organ: Secondary | ICD-10-CM | POA: Diagnosis not present

## 2024-01-14 DIAGNOSIS — C7A8 Other malignant neuroendocrine tumors: Secondary | ICD-10-CM | POA: Diagnosis not present

## 2024-01-14 DIAGNOSIS — C787 Secondary malignant neoplasm of liver and intrahepatic bile duct: Secondary | ICD-10-CM | POA: Diagnosis not present

## 2024-01-14 DIAGNOSIS — C786 Secondary malignant neoplasm of retroperitoneum and peritoneum: Secondary | ICD-10-CM | POA: Diagnosis not present

## 2024-01-14 DIAGNOSIS — R0600 Dyspnea, unspecified: Secondary | ICD-10-CM | POA: Diagnosis not present

## 2024-01-14 DIAGNOSIS — C7951 Secondary malignant neoplasm of bone: Secondary | ICD-10-CM | POA: Diagnosis not present

## 2024-01-21 DIAGNOSIS — C787 Secondary malignant neoplasm of liver and intrahepatic bile duct: Secondary | ICD-10-CM | POA: Diagnosis not present

## 2024-01-21 DIAGNOSIS — C7880 Secondary malignant neoplasm of unspecified digestive organ: Secondary | ICD-10-CM | POA: Diagnosis not present

## 2024-01-21 DIAGNOSIS — C7951 Secondary malignant neoplasm of bone: Secondary | ICD-10-CM | POA: Diagnosis not present

## 2024-01-21 DIAGNOSIS — R0600 Dyspnea, unspecified: Secondary | ICD-10-CM | POA: Diagnosis not present

## 2024-01-21 DIAGNOSIS — C786 Secondary malignant neoplasm of retroperitoneum and peritoneum: Secondary | ICD-10-CM | POA: Diagnosis not present

## 2024-01-21 DIAGNOSIS — C7A8 Other malignant neuroendocrine tumors: Secondary | ICD-10-CM | POA: Diagnosis not present

## 2024-01-28 DIAGNOSIS — C786 Secondary malignant neoplasm of retroperitoneum and peritoneum: Secondary | ICD-10-CM | POA: Diagnosis not present

## 2024-01-28 DIAGNOSIS — R0600 Dyspnea, unspecified: Secondary | ICD-10-CM | POA: Diagnosis not present

## 2024-01-28 DIAGNOSIS — C7880 Secondary malignant neoplasm of unspecified digestive organ: Secondary | ICD-10-CM | POA: Diagnosis not present

## 2024-01-28 DIAGNOSIS — C787 Secondary malignant neoplasm of liver and intrahepatic bile duct: Secondary | ICD-10-CM | POA: Diagnosis not present

## 2024-01-28 DIAGNOSIS — C7951 Secondary malignant neoplasm of bone: Secondary | ICD-10-CM | POA: Diagnosis not present

## 2024-01-28 DIAGNOSIS — C7A8 Other malignant neuroendocrine tumors: Secondary | ICD-10-CM | POA: Diagnosis not present

## 2024-02-01 DIAGNOSIS — F32A Depression, unspecified: Secondary | ICD-10-CM | POA: Diagnosis not present

## 2024-02-01 DIAGNOSIS — C7951 Secondary malignant neoplasm of bone: Secondary | ICD-10-CM | POA: Diagnosis not present

## 2024-02-01 DIAGNOSIS — C7880 Secondary malignant neoplasm of unspecified digestive organ: Secondary | ICD-10-CM | POA: Diagnosis not present

## 2024-02-01 DIAGNOSIS — E785 Hyperlipidemia, unspecified: Secondary | ICD-10-CM | POA: Diagnosis not present

## 2024-02-01 DIAGNOSIS — N400 Enlarged prostate without lower urinary tract symptoms: Secondary | ICD-10-CM | POA: Diagnosis not present

## 2024-02-01 DIAGNOSIS — R0600 Dyspnea, unspecified: Secondary | ICD-10-CM | POA: Diagnosis not present

## 2024-02-01 DIAGNOSIS — Z87891 Personal history of nicotine dependence: Secondary | ICD-10-CM | POA: Diagnosis not present

## 2024-02-01 DIAGNOSIS — C786 Secondary malignant neoplasm of retroperitoneum and peritoneum: Secondary | ICD-10-CM | POA: Diagnosis not present

## 2024-02-01 DIAGNOSIS — J449 Chronic obstructive pulmonary disease, unspecified: Secondary | ICD-10-CM | POA: Diagnosis not present

## 2024-02-01 DIAGNOSIS — R1084 Generalized abdominal pain: Secondary | ICD-10-CM | POA: Diagnosis not present

## 2024-02-01 DIAGNOSIS — C787 Secondary malignant neoplasm of liver and intrahepatic bile duct: Secondary | ICD-10-CM | POA: Diagnosis not present

## 2024-02-01 DIAGNOSIS — C7A8 Other malignant neuroendocrine tumors: Secondary | ICD-10-CM | POA: Diagnosis not present

## 2024-02-04 DIAGNOSIS — C7880 Secondary malignant neoplasm of unspecified digestive organ: Secondary | ICD-10-CM | POA: Diagnosis not present

## 2024-02-04 DIAGNOSIS — C7951 Secondary malignant neoplasm of bone: Secondary | ICD-10-CM | POA: Diagnosis not present

## 2024-02-04 DIAGNOSIS — C787 Secondary malignant neoplasm of liver and intrahepatic bile duct: Secondary | ICD-10-CM | POA: Diagnosis not present

## 2024-02-04 DIAGNOSIS — C786 Secondary malignant neoplasm of retroperitoneum and peritoneum: Secondary | ICD-10-CM | POA: Diagnosis not present

## 2024-02-04 DIAGNOSIS — C7A8 Other malignant neuroendocrine tumors: Secondary | ICD-10-CM | POA: Diagnosis not present

## 2024-02-04 DIAGNOSIS — R0600 Dyspnea, unspecified: Secondary | ICD-10-CM | POA: Diagnosis not present

## 2024-02-11 DIAGNOSIS — C786 Secondary malignant neoplasm of retroperitoneum and peritoneum: Secondary | ICD-10-CM | POA: Diagnosis not present

## 2024-02-11 DIAGNOSIS — C7951 Secondary malignant neoplasm of bone: Secondary | ICD-10-CM | POA: Diagnosis not present

## 2024-02-11 DIAGNOSIS — R0600 Dyspnea, unspecified: Secondary | ICD-10-CM | POA: Diagnosis not present

## 2024-02-11 DIAGNOSIS — C7A8 Other malignant neuroendocrine tumors: Secondary | ICD-10-CM | POA: Diagnosis not present

## 2024-02-11 DIAGNOSIS — C7880 Secondary malignant neoplasm of unspecified digestive organ: Secondary | ICD-10-CM | POA: Diagnosis not present

## 2024-02-11 DIAGNOSIS — C787 Secondary malignant neoplasm of liver and intrahepatic bile duct: Secondary | ICD-10-CM | POA: Diagnosis not present

## 2024-02-18 DIAGNOSIS — R0600 Dyspnea, unspecified: Secondary | ICD-10-CM | POA: Diagnosis not present

## 2024-02-18 DIAGNOSIS — C7880 Secondary malignant neoplasm of unspecified digestive organ: Secondary | ICD-10-CM | POA: Diagnosis not present

## 2024-02-18 DIAGNOSIS — C786 Secondary malignant neoplasm of retroperitoneum and peritoneum: Secondary | ICD-10-CM | POA: Diagnosis not present

## 2024-02-18 DIAGNOSIS — C787 Secondary malignant neoplasm of liver and intrahepatic bile duct: Secondary | ICD-10-CM | POA: Diagnosis not present

## 2024-02-18 DIAGNOSIS — C7A8 Other malignant neuroendocrine tumors: Secondary | ICD-10-CM | POA: Diagnosis not present

## 2024-02-18 DIAGNOSIS — C7951 Secondary malignant neoplasm of bone: Secondary | ICD-10-CM | POA: Diagnosis not present

## 2024-02-19 DIAGNOSIS — R0600 Dyspnea, unspecified: Secondary | ICD-10-CM | POA: Diagnosis not present

## 2024-02-19 DIAGNOSIS — C7880 Secondary malignant neoplasm of unspecified digestive organ: Secondary | ICD-10-CM | POA: Diagnosis not present

## 2024-02-19 DIAGNOSIS — C786 Secondary malignant neoplasm of retroperitoneum and peritoneum: Secondary | ICD-10-CM | POA: Diagnosis not present

## 2024-02-19 DIAGNOSIS — C7A8 Other malignant neuroendocrine tumors: Secondary | ICD-10-CM | POA: Diagnosis not present

## 2024-02-19 DIAGNOSIS — C7951 Secondary malignant neoplasm of bone: Secondary | ICD-10-CM | POA: Diagnosis not present

## 2024-02-19 DIAGNOSIS — C787 Secondary malignant neoplasm of liver and intrahepatic bile duct: Secondary | ICD-10-CM | POA: Diagnosis not present

## 2024-02-25 DIAGNOSIS — C787 Secondary malignant neoplasm of liver and intrahepatic bile duct: Secondary | ICD-10-CM | POA: Diagnosis not present

## 2024-02-25 DIAGNOSIS — C7A8 Other malignant neuroendocrine tumors: Secondary | ICD-10-CM | POA: Diagnosis not present

## 2024-02-25 DIAGNOSIS — C7951 Secondary malignant neoplasm of bone: Secondary | ICD-10-CM | POA: Diagnosis not present

## 2024-02-25 DIAGNOSIS — C7880 Secondary malignant neoplasm of unspecified digestive organ: Secondary | ICD-10-CM | POA: Diagnosis not present

## 2024-02-25 DIAGNOSIS — R0600 Dyspnea, unspecified: Secondary | ICD-10-CM | POA: Diagnosis not present

## 2024-02-25 DIAGNOSIS — C786 Secondary malignant neoplasm of retroperitoneum and peritoneum: Secondary | ICD-10-CM | POA: Diagnosis not present

## 2024-03-03 DIAGNOSIS — C786 Secondary malignant neoplasm of retroperitoneum and peritoneum: Secondary | ICD-10-CM | POA: Diagnosis not present

## 2024-03-03 DIAGNOSIS — N400 Enlarged prostate without lower urinary tract symptoms: Secondary | ICD-10-CM | POA: Diagnosis not present

## 2024-03-03 DIAGNOSIS — E785 Hyperlipidemia, unspecified: Secondary | ICD-10-CM | POA: Diagnosis not present

## 2024-03-03 DIAGNOSIS — C787 Secondary malignant neoplasm of liver and intrahepatic bile duct: Secondary | ICD-10-CM | POA: Diagnosis not present

## 2024-03-03 DIAGNOSIS — R1084 Generalized abdominal pain: Secondary | ICD-10-CM | POA: Diagnosis not present

## 2024-03-03 DIAGNOSIS — Z87891 Personal history of nicotine dependence: Secondary | ICD-10-CM | POA: Diagnosis not present

## 2024-03-03 DIAGNOSIS — F32A Depression, unspecified: Secondary | ICD-10-CM | POA: Diagnosis not present

## 2024-03-03 DIAGNOSIS — C7951 Secondary malignant neoplasm of bone: Secondary | ICD-10-CM | POA: Diagnosis not present

## 2024-03-03 DIAGNOSIS — R0600 Dyspnea, unspecified: Secondary | ICD-10-CM | POA: Diagnosis not present

## 2024-03-03 DIAGNOSIS — C7A8 Other malignant neuroendocrine tumors: Secondary | ICD-10-CM | POA: Diagnosis not present

## 2024-03-03 DIAGNOSIS — C7880 Secondary malignant neoplasm of unspecified digestive organ: Secondary | ICD-10-CM | POA: Diagnosis not present

## 2024-03-03 DIAGNOSIS — J449 Chronic obstructive pulmonary disease, unspecified: Secondary | ICD-10-CM | POA: Diagnosis not present

## 2024-03-05 DIAGNOSIS — C786 Secondary malignant neoplasm of retroperitoneum and peritoneum: Secondary | ICD-10-CM | POA: Diagnosis not present

## 2024-03-05 DIAGNOSIS — C7A8 Other malignant neuroendocrine tumors: Secondary | ICD-10-CM | POA: Diagnosis not present

## 2024-03-05 DIAGNOSIS — C7951 Secondary malignant neoplasm of bone: Secondary | ICD-10-CM | POA: Diagnosis not present

## 2024-03-05 DIAGNOSIS — C7880 Secondary malignant neoplasm of unspecified digestive organ: Secondary | ICD-10-CM | POA: Diagnosis not present

## 2024-03-05 DIAGNOSIS — R0600 Dyspnea, unspecified: Secondary | ICD-10-CM | POA: Diagnosis not present

## 2024-03-05 DIAGNOSIS — C787 Secondary malignant neoplasm of liver and intrahepatic bile duct: Secondary | ICD-10-CM | POA: Diagnosis not present

## 2024-03-10 DIAGNOSIS — R0600 Dyspnea, unspecified: Secondary | ICD-10-CM | POA: Diagnosis not present

## 2024-03-10 DIAGNOSIS — C7880 Secondary malignant neoplasm of unspecified digestive organ: Secondary | ICD-10-CM | POA: Diagnosis not present

## 2024-03-10 DIAGNOSIS — C786 Secondary malignant neoplasm of retroperitoneum and peritoneum: Secondary | ICD-10-CM | POA: Diagnosis not present

## 2024-03-10 DIAGNOSIS — C787 Secondary malignant neoplasm of liver and intrahepatic bile duct: Secondary | ICD-10-CM | POA: Diagnosis not present

## 2024-03-10 DIAGNOSIS — C7951 Secondary malignant neoplasm of bone: Secondary | ICD-10-CM | POA: Diagnosis not present

## 2024-03-10 DIAGNOSIS — C7A8 Other malignant neuroendocrine tumors: Secondary | ICD-10-CM | POA: Diagnosis not present

## 2024-03-17 DIAGNOSIS — C787 Secondary malignant neoplasm of liver and intrahepatic bile duct: Secondary | ICD-10-CM | POA: Diagnosis not present

## 2024-03-17 DIAGNOSIS — C7880 Secondary malignant neoplasm of unspecified digestive organ: Secondary | ICD-10-CM | POA: Diagnosis not present

## 2024-03-17 DIAGNOSIS — R0600 Dyspnea, unspecified: Secondary | ICD-10-CM | POA: Diagnosis not present

## 2024-03-17 DIAGNOSIS — C7951 Secondary malignant neoplasm of bone: Secondary | ICD-10-CM | POA: Diagnosis not present

## 2024-03-17 DIAGNOSIS — C7A8 Other malignant neuroendocrine tumors: Secondary | ICD-10-CM | POA: Diagnosis not present

## 2024-03-17 DIAGNOSIS — C786 Secondary malignant neoplasm of retroperitoneum and peritoneum: Secondary | ICD-10-CM | POA: Diagnosis not present

## 2024-03-24 DIAGNOSIS — C787 Secondary malignant neoplasm of liver and intrahepatic bile duct: Secondary | ICD-10-CM | POA: Diagnosis not present

## 2024-03-24 DIAGNOSIS — C7951 Secondary malignant neoplasm of bone: Secondary | ICD-10-CM | POA: Diagnosis not present

## 2024-03-24 DIAGNOSIS — C7A8 Other malignant neuroendocrine tumors: Secondary | ICD-10-CM | POA: Diagnosis not present

## 2024-03-24 DIAGNOSIS — R0600 Dyspnea, unspecified: Secondary | ICD-10-CM | POA: Diagnosis not present

## 2024-03-24 DIAGNOSIS — C786 Secondary malignant neoplasm of retroperitoneum and peritoneum: Secondary | ICD-10-CM | POA: Diagnosis not present

## 2024-03-24 DIAGNOSIS — C7880 Secondary malignant neoplasm of unspecified digestive organ: Secondary | ICD-10-CM | POA: Diagnosis not present

## 2024-03-25 DIAGNOSIS — C786 Secondary malignant neoplasm of retroperitoneum and peritoneum: Secondary | ICD-10-CM | POA: Diagnosis not present

## 2024-03-25 DIAGNOSIS — C787 Secondary malignant neoplasm of liver and intrahepatic bile duct: Secondary | ICD-10-CM | POA: Diagnosis not present

## 2024-03-25 DIAGNOSIS — C7880 Secondary malignant neoplasm of unspecified digestive organ: Secondary | ICD-10-CM | POA: Diagnosis not present

## 2024-03-25 DIAGNOSIS — C7951 Secondary malignant neoplasm of bone: Secondary | ICD-10-CM | POA: Diagnosis not present

## 2024-03-25 DIAGNOSIS — C7A8 Other malignant neuroendocrine tumors: Secondary | ICD-10-CM | POA: Diagnosis not present

## 2024-03-25 DIAGNOSIS — R0600 Dyspnea, unspecified: Secondary | ICD-10-CM | POA: Diagnosis not present

## 2024-03-31 DIAGNOSIS — C7880 Secondary malignant neoplasm of unspecified digestive organ: Secondary | ICD-10-CM | POA: Diagnosis not present

## 2024-03-31 DIAGNOSIS — C7A8 Other malignant neuroendocrine tumors: Secondary | ICD-10-CM | POA: Diagnosis not present

## 2024-03-31 DIAGNOSIS — C7951 Secondary malignant neoplasm of bone: Secondary | ICD-10-CM | POA: Diagnosis not present

## 2024-03-31 DIAGNOSIS — C786 Secondary malignant neoplasm of retroperitoneum and peritoneum: Secondary | ICD-10-CM | POA: Diagnosis not present

## 2024-03-31 DIAGNOSIS — R0600 Dyspnea, unspecified: Secondary | ICD-10-CM | POA: Diagnosis not present

## 2024-03-31 DIAGNOSIS — C787 Secondary malignant neoplasm of liver and intrahepatic bile duct: Secondary | ICD-10-CM | POA: Diagnosis not present

## 2024-04-02 DIAGNOSIS — F32A Depression, unspecified: Secondary | ICD-10-CM | POA: Diagnosis not present

## 2024-04-02 DIAGNOSIS — R1084 Generalized abdominal pain: Secondary | ICD-10-CM | POA: Diagnosis not present

## 2024-04-02 DIAGNOSIS — C7880 Secondary malignant neoplasm of unspecified digestive organ: Secondary | ICD-10-CM | POA: Diagnosis not present

## 2024-04-02 DIAGNOSIS — C7A8 Other malignant neuroendocrine tumors: Secondary | ICD-10-CM | POA: Diagnosis not present

## 2024-04-02 DIAGNOSIS — C7951 Secondary malignant neoplasm of bone: Secondary | ICD-10-CM | POA: Diagnosis not present

## 2024-04-02 DIAGNOSIS — J449 Chronic obstructive pulmonary disease, unspecified: Secondary | ICD-10-CM | POA: Diagnosis not present

## 2024-04-02 DIAGNOSIS — Z87891 Personal history of nicotine dependence: Secondary | ICD-10-CM | POA: Diagnosis not present

## 2024-04-02 DIAGNOSIS — R0600 Dyspnea, unspecified: Secondary | ICD-10-CM | POA: Diagnosis not present

## 2024-04-02 DIAGNOSIS — N400 Enlarged prostate without lower urinary tract symptoms: Secondary | ICD-10-CM | POA: Diagnosis not present

## 2024-04-02 DIAGNOSIS — E785 Hyperlipidemia, unspecified: Secondary | ICD-10-CM | POA: Diagnosis not present

## 2024-04-02 DIAGNOSIS — C786 Secondary malignant neoplasm of retroperitoneum and peritoneum: Secondary | ICD-10-CM | POA: Diagnosis not present

## 2024-04-02 DIAGNOSIS — C787 Secondary malignant neoplasm of liver and intrahepatic bile duct: Secondary | ICD-10-CM | POA: Diagnosis not present

## 2024-04-09 DIAGNOSIS — C7880 Secondary malignant neoplasm of unspecified digestive organ: Secondary | ICD-10-CM | POA: Diagnosis not present

## 2024-04-09 DIAGNOSIS — C7951 Secondary malignant neoplasm of bone: Secondary | ICD-10-CM | POA: Diagnosis not present

## 2024-04-09 DIAGNOSIS — R0600 Dyspnea, unspecified: Secondary | ICD-10-CM | POA: Diagnosis not present

## 2024-04-09 DIAGNOSIS — C787 Secondary malignant neoplasm of liver and intrahepatic bile duct: Secondary | ICD-10-CM | POA: Diagnosis not present

## 2024-04-09 DIAGNOSIS — C786 Secondary malignant neoplasm of retroperitoneum and peritoneum: Secondary | ICD-10-CM | POA: Diagnosis not present

## 2024-04-09 DIAGNOSIS — C7A8 Other malignant neuroendocrine tumors: Secondary | ICD-10-CM | POA: Diagnosis not present

## 2024-04-14 DIAGNOSIS — R0600 Dyspnea, unspecified: Secondary | ICD-10-CM | POA: Diagnosis not present

## 2024-04-14 DIAGNOSIS — C7880 Secondary malignant neoplasm of unspecified digestive organ: Secondary | ICD-10-CM | POA: Diagnosis not present

## 2024-04-14 DIAGNOSIS — C787 Secondary malignant neoplasm of liver and intrahepatic bile duct: Secondary | ICD-10-CM | POA: Diagnosis not present

## 2024-04-14 DIAGNOSIS — C786 Secondary malignant neoplasm of retroperitoneum and peritoneum: Secondary | ICD-10-CM | POA: Diagnosis not present

## 2024-04-14 DIAGNOSIS — C7A8 Other malignant neuroendocrine tumors: Secondary | ICD-10-CM | POA: Diagnosis not present

## 2024-04-14 DIAGNOSIS — C7951 Secondary malignant neoplasm of bone: Secondary | ICD-10-CM | POA: Diagnosis not present

## 2024-04-21 DIAGNOSIS — R0600 Dyspnea, unspecified: Secondary | ICD-10-CM | POA: Diagnosis not present

## 2024-04-21 DIAGNOSIS — C786 Secondary malignant neoplasm of retroperitoneum and peritoneum: Secondary | ICD-10-CM | POA: Diagnosis not present

## 2024-04-21 DIAGNOSIS — C7880 Secondary malignant neoplasm of unspecified digestive organ: Secondary | ICD-10-CM | POA: Diagnosis not present

## 2024-04-21 DIAGNOSIS — C7951 Secondary malignant neoplasm of bone: Secondary | ICD-10-CM | POA: Diagnosis not present

## 2024-04-21 DIAGNOSIS — C7A8 Other malignant neuroendocrine tumors: Secondary | ICD-10-CM | POA: Diagnosis not present

## 2024-04-21 DIAGNOSIS — C787 Secondary malignant neoplasm of liver and intrahepatic bile duct: Secondary | ICD-10-CM | POA: Diagnosis not present

## 2024-04-28 DIAGNOSIS — C787 Secondary malignant neoplasm of liver and intrahepatic bile duct: Secondary | ICD-10-CM | POA: Diagnosis not present

## 2024-04-28 DIAGNOSIS — C7A8 Other malignant neuroendocrine tumors: Secondary | ICD-10-CM | POA: Diagnosis not present

## 2024-04-28 DIAGNOSIS — C7951 Secondary malignant neoplasm of bone: Secondary | ICD-10-CM | POA: Diagnosis not present

## 2024-04-28 DIAGNOSIS — C7880 Secondary malignant neoplasm of unspecified digestive organ: Secondary | ICD-10-CM | POA: Diagnosis not present

## 2024-04-28 DIAGNOSIS — R0600 Dyspnea, unspecified: Secondary | ICD-10-CM | POA: Diagnosis not present

## 2024-04-28 DIAGNOSIS — C786 Secondary malignant neoplasm of retroperitoneum and peritoneum: Secondary | ICD-10-CM | POA: Diagnosis not present

## 2024-05-03 DIAGNOSIS — J449 Chronic obstructive pulmonary disease, unspecified: Secondary | ICD-10-CM | POA: Diagnosis not present

## 2024-05-03 DIAGNOSIS — N400 Enlarged prostate without lower urinary tract symptoms: Secondary | ICD-10-CM | POA: Diagnosis not present

## 2024-05-03 DIAGNOSIS — C7A8 Other malignant neuroendocrine tumors: Secondary | ICD-10-CM | POA: Diagnosis not present

## 2024-05-03 DIAGNOSIS — Z87891 Personal history of nicotine dependence: Secondary | ICD-10-CM | POA: Diagnosis not present

## 2024-05-03 DIAGNOSIS — R1084 Generalized abdominal pain: Secondary | ICD-10-CM | POA: Diagnosis not present

## 2024-05-03 DIAGNOSIS — C7951 Secondary malignant neoplasm of bone: Secondary | ICD-10-CM | POA: Diagnosis not present

## 2024-05-03 DIAGNOSIS — C787 Secondary malignant neoplasm of liver and intrahepatic bile duct: Secondary | ICD-10-CM | POA: Diagnosis not present

## 2024-05-03 DIAGNOSIS — R0600 Dyspnea, unspecified: Secondary | ICD-10-CM | POA: Diagnosis not present

## 2024-05-03 DIAGNOSIS — E785 Hyperlipidemia, unspecified: Secondary | ICD-10-CM | POA: Diagnosis not present

## 2024-05-03 DIAGNOSIS — C7880 Secondary malignant neoplasm of unspecified digestive organ: Secondary | ICD-10-CM | POA: Diagnosis not present

## 2024-05-03 DIAGNOSIS — C786 Secondary malignant neoplasm of retroperitoneum and peritoneum: Secondary | ICD-10-CM | POA: Diagnosis not present

## 2024-05-03 DIAGNOSIS — F32A Depression, unspecified: Secondary | ICD-10-CM | POA: Diagnosis not present

## 2024-05-05 DIAGNOSIS — C7880 Secondary malignant neoplasm of unspecified digestive organ: Secondary | ICD-10-CM | POA: Diagnosis not present

## 2024-05-05 DIAGNOSIS — C7A8 Other malignant neuroendocrine tumors: Secondary | ICD-10-CM | POA: Diagnosis not present

## 2024-05-05 DIAGNOSIS — C7951 Secondary malignant neoplasm of bone: Secondary | ICD-10-CM | POA: Diagnosis not present

## 2024-05-05 DIAGNOSIS — C787 Secondary malignant neoplasm of liver and intrahepatic bile duct: Secondary | ICD-10-CM | POA: Diagnosis not present

## 2024-05-05 DIAGNOSIS — R0600 Dyspnea, unspecified: Secondary | ICD-10-CM | POA: Diagnosis not present

## 2024-05-05 DIAGNOSIS — C786 Secondary malignant neoplasm of retroperitoneum and peritoneum: Secondary | ICD-10-CM | POA: Diagnosis not present

## 2024-05-12 DIAGNOSIS — C7880 Secondary malignant neoplasm of unspecified digestive organ: Secondary | ICD-10-CM | POA: Diagnosis not present

## 2024-05-12 DIAGNOSIS — C7A8 Other malignant neuroendocrine tumors: Secondary | ICD-10-CM | POA: Diagnosis not present

## 2024-05-12 DIAGNOSIS — C786 Secondary malignant neoplasm of retroperitoneum and peritoneum: Secondary | ICD-10-CM | POA: Diagnosis not present

## 2024-05-12 DIAGNOSIS — R0600 Dyspnea, unspecified: Secondary | ICD-10-CM | POA: Diagnosis not present

## 2024-05-12 DIAGNOSIS — C787 Secondary malignant neoplasm of liver and intrahepatic bile duct: Secondary | ICD-10-CM | POA: Diagnosis not present

## 2024-05-12 DIAGNOSIS — C7951 Secondary malignant neoplasm of bone: Secondary | ICD-10-CM | POA: Diagnosis not present

## 2024-05-19 DIAGNOSIS — C786 Secondary malignant neoplasm of retroperitoneum and peritoneum: Secondary | ICD-10-CM | POA: Diagnosis not present

## 2024-05-19 DIAGNOSIS — C7951 Secondary malignant neoplasm of bone: Secondary | ICD-10-CM | POA: Diagnosis not present

## 2024-05-19 DIAGNOSIS — C787 Secondary malignant neoplasm of liver and intrahepatic bile duct: Secondary | ICD-10-CM | POA: Diagnosis not present

## 2024-05-19 DIAGNOSIS — C7A8 Other malignant neuroendocrine tumors: Secondary | ICD-10-CM | POA: Diagnosis not present

## 2024-05-19 DIAGNOSIS — R0600 Dyspnea, unspecified: Secondary | ICD-10-CM | POA: Diagnosis not present

## 2024-05-19 DIAGNOSIS — C7880 Secondary malignant neoplasm of unspecified digestive organ: Secondary | ICD-10-CM | POA: Diagnosis not present

## 2024-05-26 DIAGNOSIS — C7A8 Other malignant neuroendocrine tumors: Secondary | ICD-10-CM | POA: Diagnosis not present

## 2024-05-26 DIAGNOSIS — C786 Secondary malignant neoplasm of retroperitoneum and peritoneum: Secondary | ICD-10-CM | POA: Diagnosis not present

## 2024-05-26 DIAGNOSIS — C787 Secondary malignant neoplasm of liver and intrahepatic bile duct: Secondary | ICD-10-CM | POA: Diagnosis not present

## 2024-05-26 DIAGNOSIS — C7880 Secondary malignant neoplasm of unspecified digestive organ: Secondary | ICD-10-CM | POA: Diagnosis not present

## 2024-05-26 DIAGNOSIS — C7951 Secondary malignant neoplasm of bone: Secondary | ICD-10-CM | POA: Diagnosis not present

## 2024-05-26 DIAGNOSIS — R0600 Dyspnea, unspecified: Secondary | ICD-10-CM | POA: Diagnosis not present
